# Patient Record
Sex: Female | Born: 1980
Health system: Southern US, Community
[De-identification: ages and names within clinical notes are randomized; demographics above are authoritative.]

## PROBLEM LIST (undated history)

## (undated) DIAGNOSIS — F419 Anxiety disorder, unspecified: Secondary | ICD-10-CM

## (undated) DIAGNOSIS — F329 Major depressive disorder, single episode, unspecified: Secondary | ICD-10-CM

## (undated) DIAGNOSIS — Z9889 Other specified postprocedural states: Secondary | ICD-10-CM

## (undated) DIAGNOSIS — B019 Varicella without complication: Secondary | ICD-10-CM

## (undated) DIAGNOSIS — E669 Obesity, unspecified: Principal | ICD-10-CM

## (undated) DIAGNOSIS — N200 Calculus of kidney: Secondary | ICD-10-CM

## (undated) DIAGNOSIS — J45909 Unspecified asthma, uncomplicated: Secondary | ICD-10-CM

## (undated) DIAGNOSIS — G43909 Migraine, unspecified, not intractable, without status migrainosus: Secondary | ICD-10-CM

## (undated) DIAGNOSIS — F32A Depression, unspecified: Secondary | ICD-10-CM

## (undated) DIAGNOSIS — E1169 Type 2 diabetes mellitus with other specified complication: Secondary | ICD-10-CM

## (undated) DIAGNOSIS — R112 Nausea with vomiting, unspecified: Secondary | ICD-10-CM

## (undated) DIAGNOSIS — K760 Fatty (change of) liver, not elsewhere classified: Secondary | ICD-10-CM

## (undated) HISTORY — DX: Major depressive disorder, single episode, unspecified: F32.9

## (undated) HISTORY — DX: Migraine, unspecified, not intractable, without status migrainosus: G43.909

## (undated) HISTORY — DX: Varicella without complication: B01.9

## (undated) HISTORY — DX: Unspecified asthma, uncomplicated: J45.909

## (undated) HISTORY — DX: Depression, unspecified: F32.A

## (undated) HISTORY — DX: Calculus of kidney: N20.0

## (undated) HISTORY — DX: Type 2 diabetes mellitus with other specified complication: E11.69

## (undated) HISTORY — DX: Obesity, unspecified: E66.9

## (undated) HISTORY — DX: Anxiety disorder, unspecified: F41.9

---

## 2012-11-27 ENCOUNTER — Emergency Department (HOSPITAL_COMMUNITY)
Admission: EM | Admit: 2012-11-27 | Discharge: 2012-11-27 | Disposition: A | Discharge status: Home / Self Care | Payer: Self-pay | Attending: Emergency Medicine | Admitting: Emergency Medicine

## 2012-11-27 ENCOUNTER — Encounter (HOSPITAL_COMMUNITY): Payer: Self-pay

## 2012-11-27 DIAGNOSIS — R6883 Chills (without fever): Secondary | ICD-10-CM | POA: Insufficient documentation

## 2012-11-27 DIAGNOSIS — N390 Urinary tract infection, site not specified: Secondary | ICD-10-CM | POA: Insufficient documentation

## 2012-11-27 DIAGNOSIS — R110 Nausea: Secondary | ICD-10-CM | POA: Insufficient documentation

## 2012-11-27 DIAGNOSIS — Z79899 Other long term (current) drug therapy: Secondary | ICD-10-CM | POA: Insufficient documentation

## 2012-11-27 DIAGNOSIS — R197 Diarrhea, unspecified: Secondary | ICD-10-CM | POA: Insufficient documentation

## 2012-11-27 DIAGNOSIS — R300 Dysuria: Secondary | ICD-10-CM | POA: Insufficient documentation

## 2012-11-27 DIAGNOSIS — Z3202 Encounter for pregnancy test, result negative: Secondary | ICD-10-CM | POA: Insufficient documentation

## 2012-11-27 DIAGNOSIS — Z8719 Personal history of other diseases of the digestive system: Secondary | ICD-10-CM | POA: Insufficient documentation

## 2012-11-27 HISTORY — DX: Fatty (change of) liver, not elsewhere classified: K76.0

## 2012-11-27 LAB — CBC WITH DIFFERENTIAL/PLATELET
Basophils Absolute: 0 10*3/uL (ref 0.0–0.1)
Eosinophils Relative: 0 % (ref 0–5)
HCT: 37.9 % (ref 36.0–46.0)
Lymphocytes Relative: 15 % (ref 12–46)
Lymphs Abs: 1.6 10*3/uL (ref 0.7–4.0)
MCH: 26.5 pg (ref 26.0–34.0)
MCV: 79.1 fL (ref 78.0–100.0)
Monocytes Absolute: 0.5 10*3/uL (ref 0.1–1.0)
RDW: 14.4 % (ref 11.5–15.5)
WBC: 10.6 10*3/uL — ABNORMAL HIGH (ref 4.0–10.5)

## 2012-11-27 LAB — URINE MICROSCOPIC-ADD ON

## 2012-11-27 LAB — URINALYSIS, ROUTINE W REFLEX MICROSCOPIC
Glucose, UA: NEGATIVE mg/dL
Ketones, ur: NEGATIVE mg/dL
pH: 8.5 — ABNORMAL HIGH (ref 5.0–8.0)

## 2012-11-27 LAB — PREGNANCY, URINE: Preg Test, Ur: NEGATIVE

## 2012-11-27 LAB — BASIC METABOLIC PANEL
CO2: 24 mEq/L (ref 19–32)
Calcium: 9.1 mg/dL (ref 8.4–10.5)
Creatinine, Ser: 0.58 mg/dL (ref 0.50–1.10)
Glucose, Bld: 174 mg/dL — ABNORMAL HIGH (ref 70–99)

## 2012-11-27 LAB — WET PREP, GENITAL
Trich, Wet Prep: NONE SEEN
Yeast Wet Prep HPF POC: NONE SEEN

## 2012-11-27 MED ORDER — ACETAMINOPHEN 325 MG PO TABS
650.0000 mg | ORAL_TABLET | Freq: Once | ORAL | Status: AC
  Administered 2012-11-27: 650 mg via ORAL

## 2012-11-27 MED ORDER — PHENAZOPYRIDINE HCL 200 MG PO TABS: 200.0000 mg | ORAL_TABLET | Freq: Three times a day (TID) | ORAL | Status: DC

## 2012-11-27 MED ORDER — CIPROFLOXACIN HCL 500 MG PO TABS: 500.0000 mg | ORAL_TABLET | Freq: Two times a day (BID) | ORAL | Status: DC

## 2012-11-27 MED ORDER — ONDANSETRON 4 MG PO TBDP
8.0000 mg | ORAL_TABLET | Freq: Once | ORAL | Status: AC
  Administered 2012-11-27: 8 mg via ORAL

## 2012-11-27 NOTE — ED Provider Notes (Signed)
History     CSN: [id number]  Arrival date & time 11/27/12  1159   First MD Initiated Contact with Patient 11/27/12 1322      Chief Complaint  Patient presents with  . Abdominal Pain    (Consider location/radiation/quality/duration/timing/severity/associated sxs/prior treatment) HPI Comments: Pt presents to the ED for intermittent, sharp, right sided flank pain radiating to her RLQ x 3 days.  Associated sx include dysuria, nausea, chills, and diarrhea.  Has had prior UTI's in the past but never associated with nausea.  Hx of fatty liver disease treated with lipitor.  Denies any hematochezia, vomiting, or vaginal discharge.  UTD with GYN exams.  No new sexual partners or concern for STD.  LMP last week.  The history is provided by the patient.    Past Medical History  Diagnosis Date  . Fatty liver   . UTI (urinary tract infection)     Past Surgical History  Procedure Laterality Date  . Cesarean section      No family history on file.  History  Substance Use Topics  . Smoking status: Not on file  . Smokeless tobacco: Not on file  . Alcohol Use: No    OB History   Grav Para Term Preterm Abortions TAB SAB Ect Mult Living                  Review of Systems  Gastrointestinal: Positive for nausea and abdominal pain.  Genitourinary: Positive for dysuria and flank pain.  All other systems reviewed and are negative.    Allergies  Review of patient's allergies indicates no known allergies.  Home Medications   Current Outpatient Rx  Name  Route  Sig  Dispense  Refill  . albuterol (PROVENTIL HFA;VENTOLIN HFA) 108 (90 BASE) MCG/ACT inhaler   Inhalation   Inhale 2 puffs into the lungs every 6 (six) hours as needed for wheezing.         Marland Kitchen atorvastatin (LIPITOR) 10 MG tablet   Oral   Take 10 mg by mouth daily.         . cetirizine (ZYRTEC) 10 MG tablet   Oral   Take 10 mg by mouth daily.         . ferrous sulfate 325 (65 FE) MG tablet   Oral   Take 325 mg  by mouth daily with breakfast.         . metFORMIN (GLUCOPHAGE) 500 MG tablet   Oral   Take 500 mg by mouth daily with breakfast.           BP 145/111  Pulse 100  Temp(Src) 97.9 F (36.6 C) (Oral)  Resp 20  SpO2 100%  LMP 11/20/2012  Physical Exam  Nursing note and vitals reviewed. Constitutional: She is oriented to person, place, and time. She appears well-developed and well-nourished.  HENT:  Head: Normocephalic and atraumatic.  Mouth/Throat: Oropharynx is clear and moist.  Eyes: Conjunctivae and EOM are normal. Pupils are equal, round, and reactive to light.  Neck: Normal range of motion.  Cardiovascular: Normal rate, regular rhythm and normal heart sounds.   Pulmonary/Chest: Effort normal and breath sounds normal.  Abdominal: Soft. Bowel sounds are normal. There is tenderness in the suprapubic area. There is CVA tenderness (R > L). There is no tenderness at McBurney's point and negative Murphy's sign.  Genitourinary: Vagina normal. There is no lesion on the right labia. There is no lesion on the left labia. Cervix exhibits no motion tenderness and no discharge.  Right adnexum displays no mass and no tenderness. Left adnexum displays no mass and no tenderness. No tenderness around the vagina.  Neurological: She is alert and oriented to person, place, and time.  Skin: Skin is warm and dry.  Psychiatric: She has a normal mood and affect.    ED Course  Procedures (including critical care time)  Labs Reviewed  WET PREP, GENITAL - Abnormal; Notable for the following:    WBC, Wet Prep HPF POC FEW (*)    All other components within normal limits  CBC WITH DIFFERENTIAL - Abnormal; Notable for the following:    WBC 10.6 (*)    Neutrophils Relative 80 (*)    Neutro Abs 8.4 (*)    All other components within normal limits  BASIC METABOLIC PANEL - Abnormal; Notable for the following:    Glucose, Bld 174 (*)    All other components within normal limits  URINALYSIS, ROUTINE W  REFLEX MICROSCOPIC - Abnormal; Notable for the following:    Color, Urine RED (*)    APPearance CLOUDY (*)    pH 8.5 (*)    Hgb urine dipstick LARGE (*)    Bilirubin Urine SMALL (*)    Protein, ur 30 (*)    Nitrite POSITIVE (*)    Leukocytes, UA TRACE (*)    All other components within normal limits  URINE CULTURE  GC/CHLAMYDIA PROBE AMP  LIPASE, BLOOD  URINE MICROSCOPIC-ADD ON  PREGNANCY, URINE   No results found.   1. Urinary tract infection   2. Dysuria       MDM   Pt presenting to the ED with R flank pain radiating to RLQ x 3 days associated with dysuria, nausea and chills.  u-preg negative.  U/A + for UTI.  Culture pending.  Wet prep negative.  Rx cipro 7d and pyridium.  Discussed plan with pt, she agreed.  Return precautions advised.        Garlon Hatchet, PA-C 11/27/12 1607

## 2012-11-27 NOTE — ED Notes (Signed)
Pt presents with right abdominal pain radiating to right flank. Pt states "i feel so nauseous." Pt had diarrhea this morning but no vomiting.

## 2012-11-27 NOTE — ED Provider Notes (Signed)
Medical screening examination/treatment/procedure(s) were performed by non-physician practitioner and as supervising physician I was immediately available for consultation/collaboration.  Gilda Crease, MD 11/27/12 [phone]

## 2012-11-27 NOTE — ED Notes (Signed)
Pt presents with 2 week h/o intermittent R sided abdominal pain.  Pt reports pain radiates around to R flank and into lower R side.  Pt reports needing to void, but not voiding a lot, reports nausea and 1 episode of diarrhea today.  Pt reports menses last week that ended x 4 days ago but reports vaginal bleeding that began today.

## 2012-11-28 LAB — URINE CULTURE

## 2012-11-30 ENCOUNTER — Encounter (HOSPITAL_COMMUNITY): Payer: Self-pay | Admitting: Emergency Medicine

## 2012-11-30 ENCOUNTER — Emergency Department (HOSPITAL_COMMUNITY)
Admission: EM | Admit: 2012-11-30 | Discharge: 2012-12-01 | Disposition: A | Discharge status: Home / Self Care | Payer: Self-pay | Attending: Emergency Medicine | Admitting: Emergency Medicine

## 2012-11-30 DIAGNOSIS — Z792 Long term (current) use of antibiotics: Secondary | ICD-10-CM | POA: Insufficient documentation

## 2012-11-30 DIAGNOSIS — Z3202 Encounter for pregnancy test, result negative: Secondary | ICD-10-CM | POA: Insufficient documentation

## 2012-11-30 DIAGNOSIS — R109 Unspecified abdominal pain: Secondary | ICD-10-CM | POA: Insufficient documentation

## 2012-11-30 DIAGNOSIS — E669 Obesity, unspecified: Secondary | ICD-10-CM | POA: Insufficient documentation

## 2012-11-30 DIAGNOSIS — Z8719 Personal history of other diseases of the digestive system: Secondary | ICD-10-CM | POA: Insufficient documentation

## 2012-11-30 DIAGNOSIS — R6883 Chills (without fever): Secondary | ICD-10-CM | POA: Insufficient documentation

## 2012-11-30 DIAGNOSIS — Z79899 Other long term (current) drug therapy: Secondary | ICD-10-CM | POA: Insufficient documentation

## 2012-11-30 DIAGNOSIS — N23 Unspecified renal colic: Secondary | ICD-10-CM | POA: Insufficient documentation

## 2012-11-30 DIAGNOSIS — R110 Nausea: Secondary | ICD-10-CM | POA: Insufficient documentation

## 2012-11-30 DIAGNOSIS — N201 Calculus of ureter: Secondary | ICD-10-CM | POA: Insufficient documentation

## 2012-11-30 LAB — POCT PREGNANCY, URINE: Preg Test, Ur: NEGATIVE

## 2012-11-30 NOTE — ED Notes (Signed)
PT. DIAGNOSED WITH UTI 4 DAYS AGO PRESCRIBED WITH CIPRO ANTIBIOTIC / PAIN MEDICATION WITH NO RELIEF REPORTS PERSISTENT RIGHT FLANK PAIN WITH NAUSEA.

## 2012-12-01 ENCOUNTER — Encounter (HOSPITAL_COMMUNITY): Payer: Self-pay | Admitting: Radiology

## 2012-12-01 ENCOUNTER — Emergency Department (HOSPITAL_COMMUNITY): Payer: Self-pay

## 2012-12-01 LAB — URINE MICROSCOPIC-ADD ON

## 2012-12-01 LAB — URINALYSIS, ROUTINE W REFLEX MICROSCOPIC
Glucose, UA: >1000 mg/dL — AB
Ketones, ur: 15 mg/dL — AB
Protein, ur: 30 mg/dL — AB
Specific Gravity, Urine: 1.027 (ref 1.005–1.030)
Urobilinogen, UA: 2 mg/dL — ABNORMAL HIGH (ref 0.0–1.0)
Urobilinogen, UA: 4 mg/dL — ABNORMAL HIGH (ref 0.0–1.0)
pH: 5 (ref 5.0–8.0)
pH: 5.5 (ref 5.0–8.0)

## 2012-12-01 LAB — CBC WITH DIFFERENTIAL/PLATELET
Basophils Absolute: 0 10*3/uL (ref 0.0–0.1)
Eosinophils Absolute: 0.1 10*3/uL (ref 0.0–0.7)
HCT: 38 % (ref 36.0–46.0)
Lymphs Abs: 2.4 10*3/uL (ref 0.7–4.0)
MCHC: 34.5 g/dL (ref 30.0–36.0)
MCV: 78 fL (ref 78.0–100.0)
Neutro Abs: 9.3 10*3/uL — ABNORMAL HIGH (ref 1.7–7.7)
Platelets: 318 10*3/uL (ref 150–400)
RDW: 14.2 % (ref 11.5–15.5)

## 2012-12-01 LAB — BASIC METABOLIC PANEL
Calcium: 9.5 mg/dL (ref 8.4–10.5)
Creatinine, Ser: 0.72 mg/dL (ref 0.50–1.10)
GFR calc Af Amer: >90 mL/min (ref >90)
GFR calc non Af Amer: >90 mL/min (ref >90)
Sodium: 137 mEq/L (ref 135–145)

## 2012-12-01 MED ORDER — TAMSULOSIN HCL 0.4 MG PO CAPS
0.4000 mg | ORAL_CAPSULE | Freq: Once | ORAL | Status: AC
  Administered 2012-12-01: 0.4 mg via ORAL
  Filled 2012-12-01: qty 1

## 2012-12-01 MED ORDER — OXYCODONE-ACETAMINOPHEN 5-325 MG PO TABS: 1.0000 | ORAL_TABLET | ORAL | Status: DC | PRN

## 2012-12-01 MED ORDER — HYDROMORPHONE HCL PF 1 MG/ML IJ SOLN
1.0000 mg | Freq: Once | INTRAMUSCULAR | Status: AC
  Administered 2012-12-01: 1 mg via INTRAVENOUS
  Filled 2012-12-01: qty 1

## 2012-12-01 MED ORDER — ONDANSETRON HCL 8 MG PO TABS: 8.0000 mg | ORAL_TABLET | Freq: Three times a day (TID) | ORAL | Status: DC | PRN

## 2012-12-01 MED ORDER — AZITHROMYCIN 500 MG IV SOLR: 500.0000 mg | Freq: Once | INTRAVENOUS | Status: DC

## 2012-12-01 MED ORDER — ONDANSETRON HCL 4 MG/2ML IJ SOLN
4.0000 mg | Freq: Once | INTRAMUSCULAR | Status: AC
  Administered 2012-12-01: 4 mg via INTRAVENOUS
  Filled 2012-12-01: qty 2

## 2012-12-01 MED ORDER — DEXTROSE 5 % IV SOLN: 1.0000 g | Freq: Once | INTRAVENOUS | Status: DC

## 2012-12-01 MED ORDER — TAMSULOSIN HCL 0.4 MG PO CAPS: ORAL_CAPSULE | ORAL | Status: DC

## 2012-12-01 NOTE — ED Notes (Signed)
In and out cath completed using sterile technique. Orange, clear urine returned.  Pt sts she is on antibiotics for UTI that makes urine orange

## 2012-12-01 NOTE — ED Provider Notes (Signed)
History     CSN: [id number]  Arrival date & time 11/30/12  2312   First MD Initiated Contact with Patient 11/30/12 2358      Chief Complaint  Patient presents with  . Urinary Tract Infection    (Consider location/radiation/quality/duration/timing/severity/associated sxs/prior treatment) HPI Comments: Martha Shannon is a 32 y.o. female who has had persistent right flank, radiating to right abdomen pain for 4 days. She was seen and treated here with Cipro for possible UTI, on 11/27/12. She is taking Percocet for pain, without relief. Her urine culture did not show a urinary tract infection. She has had periods of nausea and chills. There has been no documented fever. She denies chest pain, cough, weakness, or dizziness. Her father has kidney stones. She is diagnosed with kidney stones. There are no known modifying factors.  Patient is a 32 y.o. female presenting with urinary tract infection. The history is provided by the patient.  Urinary Tract Infection    Past Medical History  Diagnosis Date  . Fatty liver   . UTI (urinary tract infection)   . Obese     Past Surgical History  Procedure Laterality Date  . Cesarean section      No family history on file.  History  Substance Use Topics  . Smoking status: Not on file  . Smokeless tobacco: Not on file  . Alcohol Use: No    OB History   Grav Para Term Preterm Abortions TAB SAB Ect Mult Living                  Review of Systems  All other systems reviewed and are negative.    Allergies  Review of patient's allergies indicates no known allergies.  Home Medications   Current Outpatient Rx  Name  Route  Sig  Dispense  Refill  . acetaminophen (TYLENOL) 500 MG tablet   Oral   Take 500 mg by mouth every 6 (six) hours as needed for pain.         Marland Kitchen albuterol (PROVENTIL HFA;VENTOLIN HFA) 108 (90 BASE) MCG/ACT inhaler   Inhalation   Inhale 2 puffs into the lungs every 6 (six) hours as needed for wheezing.          Marland Kitchen atorvastatin (LIPITOR) 10 MG tablet   Oral   Take 10 mg by mouth daily.         . ciprofloxacin (CIPRO) 500 MG tablet   Oral   Take 500 mg by mouth 2 (two) times daily.         . ferrous sulfate 325 (65 FE) MG tablet   Oral   Take 325 mg by mouth daily with breakfast.         . metFORMIN (GLUCOPHAGE) 500 MG tablet   Oral   Take 500 mg by mouth daily with breakfast.         . oxyCODONE-acetaminophen (PERCOCET/ROXICET) 5-325 MG per tablet   Oral   Take 2 tablets by mouth every 4 (four) hours as needed for pain.         . phenazopyridine (PYRIDIUM) 200 MG tablet   Oral   Take 200 mg by mouth 3 (three) times daily.         . ondansetron (ZOFRAN) 8 MG tablet   Oral   Take 1 tablet (8 mg total) by mouth every 8 (eight) hours as needed for nausea.   20 tablet   0   . oxyCODONE-acetaminophen (PERCOCET/ROXICET) 5-325 MG per tablet  Oral   Take 1 tablet by mouth every 4 (four) hours as needed for pain.   30 tablet   0   . tamsulosin (FLOMAX) 0.4 MG CAPS      1 q HS to aid stone passage   7 capsule   0     BP 127/83  Pulse 88  Temp(Src) 97.8 F (36.6 C) (Oral)  Resp 18  SpO2 98%  LMP 11/20/2012  Physical Exam  Nursing note and vitals reviewed. Constitutional: She is oriented to person, place, and time. She appears well-developed and well-nourished. She appears distressed (appears uncomfortable, pacing the floor).  HENT:  Head: Normocephalic and atraumatic.  Eyes: Conjunctivae and EOM are normal. Pupils are equal, round, and reactive to light.  Neck: Normal range of motion and phonation normal. Neck supple.  Cardiovascular: Normal rate, regular rhythm and intact distal pulses.   Pulmonary/Chest: Effort normal and breath sounds normal. She exhibits no tenderness.  Abdominal: Soft. She exhibits no distension. There is no tenderness. There is no guarding.  Genitourinary:  Right costovertebral angle tenderness with percussion.  Musculoskeletal:  Normal range of motion.  Neurological: She is alert and oriented to person, place, and time. She has normal strength. She exhibits normal muscle tone.  Skin: Skin is warm and dry.  Psychiatric: She has a normal mood and affect. Her behavior is normal. Judgment and thought content normal.    ED Course  Procedures (including critical care time)  Medications  HYDROmorphone (DILAUDID) injection 1 mg (1 mg Intravenous Given 12/01/12 0027)  ondansetron (ZOFRAN) injection 4 mg (4 mg Intravenous Given 12/01/12 0027)  HYDROmorphone (DILAUDID) injection 1 mg (1 mg Intravenous Given 12/01/12 0121)  HYDROmorphone (DILAUDID) injection 1 mg (1 mg Intravenous Given 12/01/12 0339)  ondansetron (ZOFRAN) injection 4 mg (4 mg Intravenous Given 12/01/12 0339)  tamsulosin (FLOMAX) capsule 0.4 mg (0.4 mg Oral Given 12/01/12 0338)     Patient Vitals for the past 24 hrs:  BP Temp Temp src Pulse Resp SpO2  12/01/12 0251 127/83 mmHg 97.8 F (36.6 C) Oral 88 18 98 %  11/30/12 2321 151/78 mmHg 97.8 F (36.6 C) Oral 78 14 99 %    Discharge Reevaluation with update and discussion. After initial assessment and treatment, an updated evaluation reveals she feels better. Martha Shannon L    Labs Reviewed  URINALYSIS, ROUTINE W REFLEX MICROSCOPIC - Abnormal; Notable for the following:    Color, Urine RED (*)    APPearance CLOUDY (*)    Bilirubin Urine MODERATE (*)    Ketones, ur 15 (*)    Protein, ur 30 (*)    Urobilinogen, UA 4.0 (*)    Nitrite POSITIVE (*)    Leukocytes, UA MODERATE (*)    All other components within normal limits  URINE MICROSCOPIC-ADD ON - Abnormal; Notable for the following:    Squamous Epithelial / LPF MANY (*)    Bacteria, UA FEW (*)    All other components within normal limits  CBC WITH DIFFERENTIAL - Abnormal; Notable for the following:    WBC 12.6 (*)    Neutro Abs 9.3 (*)    All other components within normal limits  BASIC METABOLIC PANEL - Abnormal; Notable for the following:     Glucose, Bld 151 (*)    All other components within normal limits  URINALYSIS, ROUTINE W REFLEX MICROSCOPIC - Abnormal; Notable for the following:    Color, Urine RED (*)    APPearance CLOUDY (*)    Glucose, UA >1000 (*)  Hgb urine dipstick SMALL (*)    Bilirubin Urine SMALL (*)    Ketones, ur 15 (*)    Protein, ur 30 (*)    Urobilinogen, UA 2.0 (*)    Nitrite POSITIVE (*)    Leukocytes, UA SMALL (*)    All other components within normal limits  URINE MICROSCOPIC-ADD ON - Abnormal; Notable for the following:    Squamous Epithelial / LPF FEW (*)    All other components within normal limits  URINE CULTURE  POCT PREGNANCY, URINE   Ct Abdomen Pelvis Wo Contrast  12/01/2012  *RADIOLOGY REPORT*  Clinical Data: Right flank pain and nausea.  CT ABDOMEN AND PELVIS WITHOUT CONTRAST  Technique:  Multidetector CT imaging of the abdomen and pelvis was performed following the standard protocol without intravenous contrast.  Comparison: None.  Findings: Minimal right basilar atelectasis is noted.  There is diffuse fatty infiltration within the liver, with mild sparing about the gallbladder fossa.  The gallbladder is within normal limits.  The pancreas and adrenal glands are unremarkable.  There is mild fullness of the right renal collecting system, without significant hydronephrosis.  No renal or ureteral stones are seen.  No perinephric stranding is appreciated.  No free fluid is identified.  The small bowel is unremarkable in appearance.  The stomach is within normal limits.  No acute vascular abnormalities are seen.  The appendix is normal in caliber, without evidence for appendicitis.  The colon is unremarkable in appearance.  The bladder is mildly distended.  There appears to be a tiny 3-4 mm stone at the base of the bladder on the right side, about the expected location of the right vesicoureteral junction.  This may reflect a still obstructing or recently passed stone.  No inguinal lymphadenopathy is  seen.  No acute osseous abnormalities are identified.  IMPRESSION:  1.  Tiny 3-4 mm stone noted at the right base of the bladder, about the expected location of the right vesicoureteral junction.  This may reflect a still obstructing or recently passed stone.  Mild associated fullness of the right renal collecting system, without significant hydronephrosis. 2.  Diffuse fatty infiltration within the liver.   Original Report Authenticated By: Tonia Ghent, M.D.    Nursing Notes Reviewed/ Care Coordinated, and agree without changes. Applicable Imaging Reviewed.  Interpretation of Laboratory Data incorporated into ED treatment  1. Right ureteral stone   2. Ureteral colic       MDM  Flank pain, secondary to ureteral colic. A stone is small and distal and will likely pass, with symptomatic treatment. Doubt metabolic instability, serious bacterial infection or impending vascular collapse; the patient is stable for discharge.    Plan: Home Medications- Flomax, Percocet, Zofran; Home Treatments- strain urine; Recommended follow up- urology follow up 1 week       Flint Melter, MD 12/01/12 [phone]

## 2012-12-02 LAB — URINE CULTURE: Culture: NO GROWTH

## 2014-05-25 ENCOUNTER — Telehealth: Payer: Self-pay | Admitting: Gynecology

## 2014-05-25 NOTE — Telephone Encounter (Signed)
Left message, upcoming appointment canceled and needs to be rescheduled.

## 2014-06-15 ENCOUNTER — Encounter: Payer: Self-pay | Admitting: Gynecology

## 2014-06-16 ENCOUNTER — Encounter: Payer: Self-pay | Admitting: Gynecology

## 2014-08-13 ENCOUNTER — Ambulatory Visit: Payer: Self-pay | Admitting: Nurse Practitioner

## 2014-10-15 ENCOUNTER — Other Ambulatory Visit: Payer: Self-pay | Admitting: Obstetrics and Gynecology

## 2014-10-15 DIAGNOSIS — N6310 Unspecified lump in the right breast, unspecified quadrant: Principal | ICD-10-CM

## 2014-10-15 DIAGNOSIS — N6315 Unspecified lump in the right breast, overlapping quadrants: Secondary | ICD-10-CM

## 2014-10-25 ENCOUNTER — Ambulatory Visit
Admission: RE | Admit: 2014-10-25 | Discharge: 2014-10-25 | Disposition: A | Discharge status: Home / Self Care | Payer: BLUE CROSS/BLUE SHIELD | Source: Ambulatory Visit | Attending: Obstetrics and Gynecology | Admitting: Obstetrics and Gynecology

## 2014-10-25 DIAGNOSIS — N6315 Unspecified lump in the right breast, overlapping quadrants: Secondary | ICD-10-CM

## 2014-10-25 DIAGNOSIS — N6310 Unspecified lump in the right breast, unspecified quadrant: Principal | ICD-10-CM

## 2015-05-12 DIAGNOSIS — E1169 Type 2 diabetes mellitus with other specified complication: Secondary | ICD-10-CM | POA: Insufficient documentation

## 2015-05-12 DIAGNOSIS — D509 Iron deficiency anemia, unspecified: Secondary | ICD-10-CM | POA: Insufficient documentation

## 2015-05-12 DIAGNOSIS — F419 Anxiety disorder, unspecified: Secondary | ICD-10-CM | POA: Insufficient documentation

## 2015-05-12 DIAGNOSIS — F32A Depression, unspecified: Secondary | ICD-10-CM | POA: Insufficient documentation

## 2015-05-12 DIAGNOSIS — J4530 Mild persistent asthma, uncomplicated: Secondary | ICD-10-CM | POA: Insufficient documentation

## 2015-05-12 DIAGNOSIS — F329 Major depressive disorder, single episode, unspecified: Secondary | ICD-10-CM | POA: Insufficient documentation

## 2015-05-26 DIAGNOSIS — E559 Vitamin D deficiency, unspecified: Secondary | ICD-10-CM | POA: Insufficient documentation

## 2015-11-08 DIAGNOSIS — S335XXA Sprain of ligaments of lumbar spine, initial encounter: Secondary | ICD-10-CM | POA: Insufficient documentation

## 2016-09-18 ENCOUNTER — Encounter: Payer: Self-pay | Admitting: Women's Health

## 2016-09-18 ENCOUNTER — Ambulatory Visit (INDEPENDENT_AMBULATORY_CARE_PROVIDER_SITE_OTHER): Payer: 59 | Admitting: Women's Health

## 2016-09-18 VITALS — BP 130/88 | Ht 61.0 in | Wt 250.0 lb

## 2016-09-18 DIAGNOSIS — Z13220 Encounter for screening for lipoid disorders: Secondary | ICD-10-CM | POA: Diagnosis not present

## 2016-09-18 DIAGNOSIS — Z01419 Encounter for gynecological examination (general) (routine) without abnormal findings: Secondary | ICD-10-CM

## 2016-09-18 DIAGNOSIS — Z833 Family history of diabetes mellitus: Secondary | ICD-10-CM | POA: Diagnosis not present

## 2016-09-18 DIAGNOSIS — N912 Amenorrhea, unspecified: Secondary | ICD-10-CM

## 2016-09-18 LAB — TSH: TSH: 0.86 mIU/L

## 2016-09-18 LAB — COMPREHENSIVE METABOLIC PANEL
ALBUMIN: 3.7 g/dL (ref 3.6–5.1)
ALK PHOS: 62 U/L (ref 33–115)
ALT: 170 U/L — AB (ref 6–29)
AST: 113 U/L — AB (ref 10–30)
BILIRUBIN TOTAL: 0.4 mg/dL (ref 0.2–1.2)
BUN: 11 mg/dL (ref 7–25)
CALCIUM: 9.2 mg/dL (ref 8.6–10.2)
CO2: 24 mmol/L (ref 20–31)
CREATININE: 0.52 mg/dL (ref 0.50–1.10)
Chloride: 101 mmol/L (ref 98–110)
Glucose, Bld: 172 mg/dL — ABNORMAL HIGH (ref 65–99)
Potassium: 3.9 mmol/L (ref 3.5–5.3)
SODIUM: 135 mmol/L (ref 135–146)
TOTAL PROTEIN: 6.7 g/dL (ref 6.1–8.1)

## 2016-09-18 LAB — LIPID PANEL
CHOLESTEROL: 176 mg/dL (ref <200)
HDL: 33 mg/dL — ABNORMAL LOW (ref >50)
LDL Cholesterol: 127 mg/dL — ABNORMAL HIGH (ref <100)
Total CHOL/HDL Ratio: 5.3 Ratio — ABNORMAL HIGH (ref <5.0)
Triglycerides: 81 mg/dL (ref <150)
VLDL: 16 mg/dL (ref <30)

## 2016-09-18 LAB — CBC WITH DIFFERENTIAL/PLATELET
BASOS ABS: 0 {cells}/uL (ref 0–200)
Basophils Relative: 0 %
Eosinophils Absolute: 88 cells/uL (ref 15–500)
Eosinophils Relative: 1 %
HEMATOCRIT: 40.8 % (ref 35.0–45.0)
HEMOGLOBIN: 12.7 g/dL (ref 11.7–15.5)
LYMPHS ABS: 3168 {cells}/uL (ref 850–3900)
Lymphocytes Relative: 36 %
MCH: 23.6 pg — ABNORMAL LOW (ref 27.0–33.0)
MCHC: 31.1 g/dL — ABNORMAL LOW (ref 32.0–36.0)
MCV: 76 fL — ABNORMAL LOW (ref 80.0–100.0)
MONO ABS: 440 {cells}/uL (ref 200–950)
MPV: 9.3 fL (ref 7.5–12.5)
Monocytes Relative: 5 %
NEUTROS ABS: 5104 {cells}/uL (ref 1500–7800)
NEUTROS PCT: 58 %
Platelets: 346 10*3/uL (ref 140–400)
RBC: 5.37 MIL/uL — AB (ref 3.80–5.10)
RDW: 17.4 % — ABNORMAL HIGH (ref 11.0–15.0)
WBC: 8.8 10*3/uL (ref 3.8–10.8)

## 2016-09-18 NOTE — Patient Instructions (Addendum)
Health Maintenance, Female Introduction Carbohydrate Counting for Diabetes Mellitus, Adult Carbohydrate counting is a method for keeping track of how many carbohydrates you eat. Eating carbohydrates naturally increases the amount of sugar (glucose) in the blood. Counting how many carbohydrates you eat helps keep your blood glucose within normal limits, which helps you manage your diabetes (diabetes mellitus). It is important to know how many carbohydrates you can safely have in each meal. This is different for every person. A diet and nutrition specialist (registered dietitian) can help you make a meal plan and calculate how many carbohydrates you should have at each meal and snack. Carbohydrates are found in the following foods:  Grains, such as breads and cereals.  Dried beans and soy products.  Starchy vegetables, such as potatoes, peas, and corn.  Fruit and fruit juices.  Milk and yogurt.  Sweets and snack foods, such as cake, cookies, candy, chips, and soft drinks. How do I count carbohydrates? There are two ways to count carbohydrates in food. You can use either of the methods or a combination of both. Reading "Nutrition Facts" on packaged food  The "Nutrition Facts" list is included on the labels of almost all packaged foods and beverages in the [place] It includes:  The serving size.  Information about nutrients in each serving, including the grams (g) of carbohydrate per serving. To use the "Nutrition Facts":  Decide how many servings you will have.  Multiply the number of servings by the number of carbohydrates per serving.  The resulting number is the total amount of carbohydrates that you will be having. Learning standard serving sizes of other foods  When you eat foods containing carbohydrates that are not packaged or do not include "Nutrition Facts" on the label, you need to measure the servings in order to count the amount of carbohydrates:  Measure the foods that you  will eat with a food scale or measuring cup, if needed.  Decide how many standard-size servings you will eat.  Multiply the number of servings by 15. Most carbohydrate-rich foods have about 15 g of carbohydrates per serving.  For example, if you eat 8 oz (170 g) of strawberries, you will have eaten 2 servings and 30 g of carbohydrates (2 servings x 15 g = 30 g).  For foods that have more than one food mixed, such as soups and casseroles, you must count the carbohydrates in each food that is included. The following list contains standard serving sizes of common carbohydrate-rich foods. Each of these servings has about 15 g of carbohydrates:   hamburger bun or  English muffin.   oz (15 mL) syrup.   oz (14 g) jelly.  1 slice of bread.  1 six-inch tortilla.  3 oz (85 g) cooked rice or pasta.  4 oz (113 g) cooked dried beans.  4 oz (113 g) starchy vegetable, such as peas, corn, or potatoes.  4 oz (113 g) hot cereal.  4 oz (113 g) mashed potatoes or  of a large baked potato.  4 oz (113 g) canned or frozen fruit.  4 oz (120 mL) fruit juice.  4-6 crackers.  6 chicken nuggets.  6 oz (170 g) unsweetened dry cereal.  6 oz (170 g) plain fat-free yogurt or yogurt sweetened with artificial sweeteners.  8 oz (240 mL) milk.  8 oz (170 g) fresh fruit or one small piece of fruit.  24 oz (680 g) popped popcorn. Example of carbohydrate counting Sample meal  3 oz (85 g) chicken  breast.  6 oz (170 g) brown rice.  4 oz (113 g) corn.  8 oz (240 mL) milk.  8 oz (170 g) strawberries with sugar-free whipped topping. Carbohydrate calculation 1. Identify the foods that contain carbohydrates:  Rice.  Corn.  Milk.  Strawberries. 2. Calculate how many servings you have of each food:  2 servings rice.  1 serving corn.  1 serving milk.  1 serving strawberries. 3. Multiply each number of servings by 15 g:  2 servings rice x 15 g = 30 g.  1 serving corn x 15 g = 15  g.  1 serving milk x 15 g = 15 g.  1 serving strawberries x 15 g = 15 g. 4. Add together all of the amounts to find the total grams of carbohydrates eaten:  30 g + 15 g + 15 g + 15 g = 75 g of carbohydrates total. This information is not intended to replace advice given to you by your health care provider. Make sure you discuss any questions you have with your health care provider. Document Released: 08/13/2005 Document Revised: 03/02/2016 Document Reviewed: 01/25/2016 Elsevier Interactive Patient Education  2017 [employer] ng preventive care can go a long way to promote health and wellness. Talk with your health care provider about what schedule of regular examinations is right for you. This is a good chance for you to check in with your provider about disease prevention and staying healthy. In between checkups, there are plenty of things you can do on your own. Experts have done a lot of research about which lifestyle changes and preventive measures are most likely to keep you healthy. Ask your health care provider for more information. Weight and diet Eat a healthy diet  Be sure to include plenty of vegetables, fruits, low-fat dairy products, and lean protein.  Do not eat a lot of foods high in solid fats, added sugars, or salt.  Get regular exercise. This is one of the most important things you can do for your health.  Most adults should exercise for at least 150 minutes each week. The exercise should increase your heart rate and make you sweat (moderate-intensity exercise).  Most adults should also do strengthening exercises at least twice a week. This is in addition to the moderate-intensity exercise. Maintain a healthy weight  Body mass index (BMI) is a measurement that can be used to identify possible weight problems. It estimates body fat based on height and weight. Your health care provider can help determine your BMI and help you achieve or maintain a healthy weight.  For  females 66 years of age and older:  A BMI below 18.5 is considered underweight.  A BMI of 18.5 to 24.9 is normal.  A BMI of 25 to 29.9 is considered overweight.  A BMI of 30 and above is considered obese. Watch levels of cholesterol and blood lipids  You should start having your blood tested for lipids and cholesterol at 37 years of age, then have this test every 5 years.  You may need to have your cholesterol levels checked more often if:  Your lipid or cholesterol levels are high.  You are older than 37 years of age.  You are at high risk for heart disease. Cancer screening Lung Cancer  Lung cancer screening is recommended for adults 90-44 years old who are at high risk for lung cancer because of a history of smoking.  A yearly low-dose CT scan of the lungs is recommended  for people who:  Currently smoke.  Have quit within the past 15 years.  Have at least a 30-pack-year history of smoking. A pack year is smoking an average of one pack of cigarettes a day for 1 year.  Yearly screening should continue until it has been 15 years since you quit.  Yearly screening should stop if you develop a health problem that would prevent you from having lung cancer treatment. Breast Cancer  Practice breast self-awareness. This means understanding how your breasts normally appear and feel.  It also means doing regular breast self-exams. Let your health care provider know about any changes, no matter how small.  If you are in your 20s or 30s, you should have a clinical breast exam (CBE) by a health care provider every 1-3 years as part of a regular health exam.  If you are 74 or older, have a CBE every year. Also consider having a breast X-ray (mammogram) every year.  If you have a family history of breast cancer, talk to your health care provider about genetic screening.  If you are at high risk for breast cancer, talk to your health care provider about having an MRI and a mammogram  every year.  Breast cancer gene (BRCA) assessment is recommended for women who have family members with BRCA-related cancers. BRCA-related cancers include:  Breast.  Ovarian.  Tubal.  Peritoneal cancers.  Results of the assessment will determine the need for genetic counseling and BRCA1 and BRCA2 testing. Cervical Cancer  Your health care provider may recommend that you be screened regularly for cancer of the pelvic organs (ovaries, uterus, and vagina). This screening involves a pelvic examination, including checking for microscopic changes to the surface of your cervix (Pap test). You may be encouraged to have this screening done every 3 years, beginning at age 18.  For women ages 39-65, health care providers may recommend pelvic exams and Pap testing every 3 years, or they may recommend the Pap and pelvic exam, combined with testing for human papilloma virus (HPV), every 5 years. Some types of HPV increase your risk of cervical cancer. Testing for HPV may also be done on women of any age with unclear Pap test results.  Other health care providers may not recommend any screening for nonpregnant women who are considered low risk for pelvic cancer and who do not have symptoms. Ask your health care provider if a screening pelvic exam is right for you.  If you have had past treatment for cervical cancer or a condition that could lead to cancer, you need Pap tests and screening for cancer for at least 20 years after your treatment. If Pap tests have been discontinued, your risk factors (such as having a new sexual partner) need to be reassessed to determine if screening should resume. Some women have medical problems that increase the chance of getting cervical cancer. In these cases, your health care provider may recommend more frequent screening and Pap tests. Colorectal Cancer  This type of cancer can be detected and often prevented.  Routine colorectal cancer screening usually begins at 37  years of age and continues through 37 years of age.  Your health care provider may recommend screening at an earlier age if you have risk factors for colon cancer.  Your health care provider may also recommend using home test kits to check for hidden blood in the stool.  A small camera at the end of a tube can be used to examine your colon directly (sigmoidoscopy  or colonoscopy). This is done to check for the earliest forms of colorectal cancer.  Routine screening usually begins at age 17.  Direct examination of the colon should be repeated every 5-10 years through 37 years of age. However, you may need to be screened more often if early forms of precancerous polyps or small growths are found. Skin Cancer  Check your skin from head to toe regularly.  Tell your health care provider about any new moles or changes in moles, especially if there is a change in a mole's shape or color.  Also tell your health care provider if you have a mole that is larger than the size of a pencil eraser.  Always use sunscreen. Apply sunscreen liberally and repeatedly throughout the day.  Protect yourself by wearing long sleeves, pants, a wide-brimmed hat, and sunglasses whenever you are outside. Heart disease, diabetes, and high blood pressure  High blood pressure causes heart disease and increases the risk of stroke. High blood pressure is more likely to develop in:  People who have blood pressure in the high end of the normal range (130-139/85-89 mm Hg).  People who are overweight or obese.  People who are African American.  If you are 17-72 years of age, have your blood pressure checked every 3-5 years. If you are 58 years of age or older, have your blood pressure checked every year. You should have your blood pressure measured twice-once when you are at a hospital or clinic, and once when you are not at a hospital or clinic. Record the average of the two measurements. To check your blood pressure when  you are not at a hospital or clinic, you can use:  An automated blood pressure machine at a pharmacy.  A home blood pressure monitor.  If you are between 88 years and 107 years old, ask your health care provider if you should take aspirin to prevent strokes.  Have regular diabetes screenings. This involves taking a blood sample to check your fasting blood sugar level.  If you are at a normal weight and have a low risk for diabetes, have this test once every three years after 37 years of age.  If you are overweight and have a high risk for diabetes, consider being tested at a younger age or more often. Preventing infection Hepatitis B  If you have a higher risk for hepatitis B, you should be screened for this virus. You are considered at high risk for hepatitis B if:  You were born in a country where hepatitis B is common. Ask your health care provider which countries are considered high risk.  Your parents were born in a high-risk country, and you have not been immunized against hepatitis B (hepatitis B vaccine).  You have HIV or AIDS.  You use needles to inject street drugs.  You live with someone who has hepatitis B.  You have had sex with someone who has hepatitis B.  You get hemodialysis treatment.  You take certain medicines for conditions, including cancer, organ transplantation, and autoimmune conditions. Hepatitis C  Blood testing is recommended for:  Everyone born from [birth year] through 1966.  Anyone with known risk factors for hepatitis C. Sexually transmitted infections (STIs)  You should be screened for sexually transmitted infections (STIs) including gonorrhea and chlamydia if:  You are sexually active and are younger than 37 years of age.  You are older than 37 years of age and your health care provider tells you that you are at  risk for this type of infection.  Your sexual activity has changed since you were last screened and you are at an increased risk for  chlamydia or gonorrhea. Ask your health care provider if you are at risk.  If you do not have HIV, but are at risk, it may be recommended that you take a prescription medicine daily to prevent HIV infection. This is called pre-exposure prophylaxis (PrEP). You are considered at risk if:  You are sexually active and do not regularly use condoms or know the HIV status of your partner(s).  You take drugs by injection.  You are sexually active with a partner who has HIV. Talk with your health care provider about whether you are at high risk of being infected with HIV. If you choose to begin PrEP, you should first be tested for HIV. You should then be tested every 3 months for as long as you are taking PrEP. Pregnancy  If you are premenopausal and you may become pregnant, ask your health care provider about preconception counseling.  If you may become pregnant, take 400 to 800 micrograms (mcg) of folic acid every day.  If you want to prevent pregnancy, talk to your health care provider about birth control (contraception). Osteoporosis and menopause  Osteoporosis is a disease in which the bones lose minerals and strength with aging. This can result in serious bone fractures. Your risk for osteoporosis can be identified using a bone density scan.  If you are 57 years of age or older, or if you are at risk for osteoporosis and fractures, ask your health care provider if you should be screened.  Ask your health care provider whether you should take a calcium or vitamin D supplement to lower your risk for osteoporosis.  Menopause may have certain physical symptoms and risks.  Hormone replacement therapy may reduce some of these symptoms and risks. Talk to your health care provider about whether hormone replacement therapy is right for you. Follow these instructions at home:  Schedule regular health, dental, and eye exams.  Stay current with your immunizations.  Do not use any tobacco products  including cigarettes, chewing tobacco, or electronic cigarettes.  If you are pregnant, do not drink alcohol.  If you are breastfeeding, limit how much and how often you drink alcohol.  Limit alcohol intake to no more than 1 drink per day for nonpregnant women. One drink equals 12 ounces of beer, 5 ounces of wine, or 1 ounces of hard liquor.  Do not use street drugs.  Do not share needles.  Ask your health care provider for help if you need support or information about quitting drugs.  Tell your health care provider if you often feel depressed.  Tell your health care provider if you have ever been abused or do not feel safe at home. This information is not intended to replace advice given to you by your health care provider. Make sure you discuss any questions you have with your health care provider. Document Released: 02/26/2011 Document Revised: 01/19/2016 Document Reviewed: 05/17/2015  2017 Elsevier

## 2016-09-18 NOTE — Progress Notes (Signed)
Martha CassetteJessica Shannon 06-Aug-1981 [id number]    History:    Presents for annual exam. Monthly cycles until November amenorrheic since November 28, has had several negative home UPT's. Uses no contraception. Daughter [id number]. Reports normal Pap history. Primary care placed on Glucophage, Lexapro and asthma medication. Has not had follow-up in about a year. GDM.  Past medical history, past surgical history, family history and social history were all reviewed and documented in the EPIC chart. 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 Parents diabetes, father heart disease.  ROS:  A ROS was performed and pertinent positives and negatives are included.  Exam:  Vitals:   09/18/16 1428  BP: 130/88  Weight: 250 lb (113.4 kg)  Height: 5\' 1"  (1.549 m)   Body mass index is 47.24 kg/m.   General appearance:  Normal Thyroid:  Symmetrical, normal in size, without palpable masses or nodularity. Respiratory  Auscultation:  Clear without wheezing or rhonchi Cardiovascular  Auscultation:  Regular rate, without rubs, murmurs or gallops  Edema/varicosities:  Not grossly evident Abdominal  Soft,nontender, without masses, guarding or rebound.  Liver/spleen:  No organomegaly noted  Hernia:  None appreciated  Skin  Inspection:  Grossly normal   Breasts: Examined lying and sitting.     Right: Without masses, retractions, discharge or axillary adenopathy.     Left: Without masses, retractions, discharge or axillary adenopathy. Gentitourinary   Inguinal/mons:  Normal without inguinal adenopathy  External genitalia:  Normal  BUS/Urethra/Skene's glands:  Normal  Vagina:  Normal  Cervix:  Normal  Uterus:  normal in size, shape and contour.  Midline and mobile  Adnexa/parametria:     Rt: Without masses or tenderness.   Lt: Without masses or tenderness.  Anus and perineum: Normal  Digital rectal exam: Normal sphincter tone without palpated masses or tenderness  Assessment/Plan:  36 y.o. MWF G1 P1  for annual exam.     Amenorrhea Morbid obesity Type 2 diabetes on metformin per primary care Anxiety/depression on Lexapro per primary care  Plan: HCG qualitative, if negative withdrawal with Provera 10 for 5 days, will call for results. Contraception reviewed, declines pregnancy okay. CBC, lipid panel, hemoglobin A1c, FSH, TSH, prolactin, UA, Pap with HR HPV typing. New screening guidelines reviewed. Instructed to printout lab results and take to primary care for follow-up and refills of medication. SBE's, annual screening mammogram at 40, reviewed importance of increasing exercise and decreasing calories for weight loss. MVI daily encouraged.    Harrington ChallengerYOUNG,Martha Stephani J [facility] Medical CenterWHNP, 4:04 PM 09/18/2016

## 2016-09-19 ENCOUNTER — Other Ambulatory Visit: Payer: Self-pay | Admitting: Women's Health

## 2016-09-19 LAB — URINALYSIS W MICROSCOPIC + REFLEX CULTURE
BILIRUBIN URINE: NEGATIVE
Casts: NONE SEEN [LPF]
Crystals: NONE SEEN [HPF]
HGB URINE DIPSTICK: NEGATIVE
LEUKOCYTES UA: NEGATIVE
NITRITE: NEGATIVE
PH: 5.5 (ref 5.0–8.0)
PROTEIN: NEGATIVE
RBC / HPF: NONE SEEN RBC/HPF (ref <=2)
Specific Gravity, Urine: 1.03 (ref 1.001–1.035)

## 2016-09-19 LAB — HEMOGLOBIN A1C
Hgb A1c MFr Bld: 9.7 % — ABNORMAL HIGH (ref <5.7)
Mean Plasma Glucose: 232 mg/dL

## 2016-09-19 LAB — PAP, TP IMAGING W/ HPV RNA, RFLX HPV TYPE 16,18/45: HPV MRNA, HIGH RISK: NOT DETECTED

## 2016-09-19 LAB — HCG, SERUM, QUALITATIVE: PREG SERUM: NEGATIVE

## 2016-09-19 LAB — PROLACTIN: PROLACTIN: 11 ng/mL

## 2016-09-19 LAB — FOLLICLE STIMULATING HORMONE: FSH: 3.3 m[IU]/mL

## 2016-09-19 MED ORDER — MEDROXYPROGESTERONE ACETATE 10 MG PO TABS: 10.0000 mg | ORAL_TABLET | Freq: Every day | ORAL | 0 refills | Status: DC

## 2016-09-20 LAB — URINE CULTURE

## 2016-09-21 ENCOUNTER — Telehealth: Payer: Self-pay | Admitting: Behavioral Health

## 2016-09-21 NOTE — Telephone Encounter (Signed)
Unable to reach patient at time of Pre-Visit Call.  Left message for patient to return call when available.    

## 2016-09-24 ENCOUNTER — Encounter: Payer: Self-pay | Admitting: Family Medicine

## 2016-09-24 ENCOUNTER — Ambulatory Visit (INDEPENDENT_AMBULATORY_CARE_PROVIDER_SITE_OTHER): Payer: 59 | Admitting: Family Medicine

## 2016-09-24 ENCOUNTER — Telehealth: Payer: Self-pay | Admitting: Family Medicine

## 2016-09-24 VITALS — BP 135/89 | HR 87 | Temp 98.5°F | Ht 61.0 in | Wt 249.4 lb

## 2016-09-24 DIAGNOSIS — F418 Other specified anxiety disorders: Secondary | ICD-10-CM

## 2016-09-24 DIAGNOSIS — F329 Major depressive disorder, single episode, unspecified: Secondary | ICD-10-CM

## 2016-09-24 DIAGNOSIS — E1169 Type 2 diabetes mellitus with other specified complication: Secondary | ICD-10-CM | POA: Diagnosis not present

## 2016-09-24 DIAGNOSIS — E669 Obesity, unspecified: Secondary | ICD-10-CM | POA: Diagnosis not present

## 2016-09-24 DIAGNOSIS — F419 Anxiety disorder, unspecified: Secondary | ICD-10-CM

## 2016-09-24 DIAGNOSIS — F32A Depression, unspecified: Secondary | ICD-10-CM

## 2016-09-24 HISTORY — DX: Type 2 diabetes mellitus with other specified complication: E11.69

## 2016-09-24 HISTORY — DX: Type 2 diabetes mellitus with other specified complication: E66.9

## 2016-09-24 HISTORY — DX: Depression, unspecified: F32.A

## 2016-09-24 MED ORDER — SITAGLIPTIN PHOSPHATE 100 MG PO TABS: 100.0000 mg | ORAL_TABLET | Freq: Every day | ORAL | 1 refills | Status: DC

## 2016-09-24 MED ORDER — METFORMIN HCL ER 500 MG PO TB24: 1000.0000 mg | ORAL_TABLET | Freq: Every day | ORAL | 2 refills | Status: DC

## 2016-09-24 MED ORDER — LIRAGLUTIDE 18 MG/3ML ~~LOC~~ SOPN: 1.2000 mg | PEN_INJECTOR | Freq: Every day | SUBCUTANEOUS | 2 refills | Status: DC

## 2016-09-24 NOTE — Progress Notes (Signed)
Pre visit review using our clinic review tool, if applicable. No additional management support is needed unless otherwise documented below in the visit note. 

## 2016-09-24 NOTE — Telephone Encounter (Signed)
Victoza too expensive so it was D/C'd. Januvia called in. If this is too expensive, there is a [place] assistance card available at our office. Please call in Farxiga 5 mg daily in addition to providing pt with card if Januvia is not affordable. TY.

## 2016-09-24 NOTE — Progress Notes (Signed)
Chief Complaint  Patient presents with  . Establish Care    A1C levels have been on the high side. 251 this morning, 181 on another occasion.       New Patient Visit SUBJECTIVE: HPI: Martha MaduroJessica Shannon is an 36 y.o.female who is being seen for establishing care.  [facility]She is changing to our office due to insurance in network coverage.  The patient recently had her A1c checked at her obstetrician's. It was 9.7. She is currently on the extended release version of metformin taking 3 tabs at night daily. She was initially told to take 2 tabs twice daily. She states it has been giving her diarrhea and she is wondering if there are any other options. Admittedly, due to the side effects, she has not been taking her metformin routinely. The patient know she struggles with her weight and is making lifestyle changes to fix this. She has been walking a minimally, however does admit that when she gets home from work, she does not feel like doing anything. She is not on a statin, nor is she on an ACE inhibitor/arb. She is not on anything for contraception. Her last eye appointment was over 2 years ago. She's never had a "urine test for diabetes" (microalbumin/cr). She is unsure if she has ever had a pneumonia shot before. She is going to check her records on this.  No Known Allergies  Past Medical History:  Diagnosis Date  . Anxiety   . Anxiety and depression 09/24/2016  . Asthma   . Chicken pox    As a child.  . Depression   . Diabetes mellitus type 2 in obese (HCC) 09/24/2016  . Diabetes mellitus without complication (HCC)   . Fatty liver   . Kidney stones   . Migraines   . Obese   . UTI (urinary tract infection)    Past Surgical History:  Procedure Laterality Date  . CESAREAN SECTION     2006.   Social History   Social History  . Marital status: Married   Social History Main Topics  . Smoking status: Never Smoker  . Smokeless tobacco: Never  Used  . Alcohol use No  . Drug use: No  . Sexual activity: Yes    Birth control/ protection: None     Comment: 1ST INTERCOURSE- 14, PARTNERS -1   Family History  Problem Relation Age of Onset  . Diabetes Mother   . Diabetes Father   . Hypertension Father   . Heart disease Father   . Diabetes Sister   . Alzheimer's disease Paternal Grandmother   . Alzheimer's disease Paternal Grandfather     Current Outpatient Prescriptions:  .  Cyanocobalamin (VITAMIN B 12 PO), Take by mouth., Disp: , Rfl:  .  escitalopram (LEXAPRO) 20 MG tablet, Take 20 mg by mouth daily., Disp: , Rfl:  .  busPIRone (BUSPAR) 7.5 MG tablet, Take 1 tablet (7.5 mg total) by mouth 2 (two) times daily., Disp: , Rfl:  .  liraglutide 18 MG/3ML SOPN, Inject 0.2 mLs (1.2 mg total) into the skin daily. Take 0.6 (0.1 mL) daily for the first 2 weeks., Disp: 3 mL, Rfl: 2 .  metFORMIN (GLUCOPHAGE XR) 500 MG 24 hr tablet, Take 2 tablets (1,000 mg total) by mouth at bedtime., Disp: 60 tablet, Rfl: 2  No LMP recorded. Patient is not currently having periods (Reason: Other).  ROS Cardiovascular: Denies chest pain  Respiratory: Denies dyspnea   OBJECTIVE: BP 135/89 (  BP Location: Left Arm, Patient Position: Sitting, Cuff Size: Large)   Pulse 87   Temp 98.5 F (36.9 C) (Oral)   Ht 5\' 1"  (1.549 m)   Wt 249 lb 6.4 oz (113.1 kg)   SpO2 98% Comment: RA  BMI 47.12 kg/m   Constitutional: -  VS reviewed -  Well developed, well nourished, appears stated age -  No apparent distress  Psychiatric: -  Oriented to person, place, and time -  Memory intact -  Affect and mood normal -  Fluent conversation, good eye contact -  Judgment and insight age appropriate  Eye: -  Conjunctivae clear, no discharge -  Pupils symmetric, round, reactive to light  ENMT: -  Oral mucosa without lesions, tongue and uvula midline    Tonsils not enlarged, no erythema, no exudate, trachea midline    Pharynx moist, no lesions, no erythema  Neck: -  No  gross swelling, no palpable masses -  Thyroid midline, not enlarged, mobile, no palpable masses  Cardiovascular: -  RRR, no murmurs -  No LE edema  Respiratory: -  Normal respiratory effort, no accessory muscle use, no retraction -  Breath sounds equal, no wheezes, no ronchi, no crackles  Gastrointestinal: -  Bowel sounds normal -  No tenderness, no distention, no guarding, no masses  Musculoskeletal: -  No clubbing, no cyanosis -  Gait normal  Skin: - Circumferential and hyperpigmented skin around the neck -  Warm and dry to palpation   ASSESSMENT/PLAN: Diabetes mellitus type 2 in obese (HCC) - Plan: metFORMIN (GLUCOPHAGE XR) 500 MG 24 hr tablet, liraglutide 18 MG/3ML SOPN, Microalbumin / creatinine urine ratio  Anxiety and depression - Plan: busPIRone (BUSPAR) 7.5 MG tablet  Patient instructed to sign release of records form from her previous PCP. I would like her to take 2 tablets once daily as she is on the extended release form. If she is still having issues, decreased down to 1 tablet daily. Will add Victoza to help with weight loss and A1c reduction. I discussed this in a prole and statin medications and the role with diabetes. I also explained that if the woman were to get pregnant while on either of these medications, it would be catastrophic for the fetus. She states that she will reach out to her current eye care provider about a diabetic eye exam. She will let us know she needs a referral anywhere. She was counseled on diet and exercise as well as given written information. Patient should return in 6 weeks to recheck DM. The patient voiced understanding and agreement to the plan.   Jilda Roche [place], DO 09/24/16  5:25 PM

## 2016-09-24 NOTE — Patient Instructions (Signed)

## 2016-09-25 LAB — MICROALBUMIN / CREATININE URINE RATIO
CREATININE, U: 210.4 mg/dL
MICROALB UR: 10.2 mg/dL — AB (ref 0.0–1.9)
MICROALB/CREAT RATIO: 4.8 mg/g (ref 0.0–30.0)

## 2016-09-25 MED ORDER — DAPAGLIFLOZIN PROPANEDIOL 5 MG PO TABS: 5.0000 mg | ORAL_TABLET | Freq: Every day | ORAL | 3 refills | Status: DC

## 2016-09-25 NOTE — Telephone Encounter (Signed)
Rx for Martha DeistFarxiga has been sent to the pharmacy for pick up. TL/CMA

## 2016-10-01 ENCOUNTER — Telehealth: Payer: Self-pay

## 2016-10-01 MED FILL — busPIRone HCL 7.5 MG TABS: 7.5 | 45 days supply | Qty: 90 | Fill #0

## 2016-10-01 MED FILL — ESCITALOPRAM 20 MG TABLET: 20 | 90 days supply | Qty: 90 | Fill #0

## 2016-10-01 NOTE — Telephone Encounter (Signed)
PA initiated via Covermymeds; KEY: [id number]. Awaiting determination.

## 2016-10-08 NOTE — Telephone Encounter (Signed)
Prior Authorization for Farxiga 5 MG was declined. Sent to provider for review.

## 2016-10-11 ENCOUNTER — Other Ambulatory Visit: Payer: Self-pay

## 2016-10-11 MED ORDER — EMPAGLIFLOZIN 10 MG PO TABS: 10.0000 mg | ORAL_TABLET | Freq: Every day | ORAL | 0 refills | Status: DC

## 2016-10-11 NOTE — Telephone Encounter (Signed)
Insurance has denied Rx for [place] and per Dr . Carmelia RollerWendling he would like to switch to [place]. #90 tablets #0 refills. TL/CMA

## 2016-10-24 MED FILL — ESCITALOPRAM 10 MG TABLET: 10 | 90 days supply | Qty: 90 | Fill #0

## 2016-10-24 MED FILL — METFORMIN HCL ER 500 MG TAB: 500 | 30 days supply | Qty: 60 | Fill #0

## 2016-10-24 MED FILL — JANUVIA 100 MG TABLET: 100 | 30 days supply | Qty: 30 | Fill #0

## 2016-11-05 ENCOUNTER — Ambulatory Visit: Payer: Self-pay | Admitting: Family Medicine

## 2016-11-26 ENCOUNTER — Telehealth: Payer: Self-pay

## 2016-11-26 ENCOUNTER — Ambulatory Visit (INDEPENDENT_AMBULATORY_CARE_PROVIDER_SITE_OTHER): Payer: 59 | Admitting: Family Medicine

## 2016-11-26 ENCOUNTER — Encounter: Payer: Self-pay | Admitting: Family Medicine

## 2016-11-26 VITALS — BP 126/84 | HR 94 | Temp 98.5°F | Ht 61.0 in | Wt 251.4 lb

## 2016-11-26 DIAGNOSIS — E1169 Type 2 diabetes mellitus with other specified complication: Secondary | ICD-10-CM

## 2016-11-26 DIAGNOSIS — F329 Major depressive disorder, single episode, unspecified: Secondary | ICD-10-CM

## 2016-11-26 DIAGNOSIS — T300 Burn of unspecified body region, unspecified degree: Secondary | ICD-10-CM | POA: Diagnosis not present

## 2016-11-26 DIAGNOSIS — F418 Other specified anxiety disorders: Secondary | ICD-10-CM | POA: Diagnosis not present

## 2016-11-26 DIAGNOSIS — E669 Obesity, unspecified: Secondary | ICD-10-CM | POA: Diagnosis not present

## 2016-11-26 DIAGNOSIS — F419 Anxiety disorder, unspecified: Secondary | ICD-10-CM

## 2016-11-26 DIAGNOSIS — F32A Depression, unspecified: Secondary | ICD-10-CM

## 2016-11-26 MED ORDER — SITAGLIPTIN PHOSPHATE 100 MG PO TABS: 100.0000 mg | ORAL_TABLET | Freq: Every day | ORAL | 1 refills | Status: DC

## 2016-11-26 MED ORDER — DAPAGLIFLOZIN PROPANEDIOL 5 MG PO TABS: 5.0000 mg | ORAL_TABLET | Freq: Every day | ORAL | 2 refills | Status: DC

## 2016-11-26 MED ORDER — SILVER SULFADIAZINE 1 % EX CREA: 1.0000 "application " | TOPICAL_CREAM | Freq: Every day | CUTANEOUS | 1 refills | Status: DC

## 2016-11-26 MED ORDER — BUSPIRONE HCL 7.5 MG PO TABS: 7.5000 mg | ORAL_TABLET | Freq: Two times a day (BID) | ORAL | 1 refills | Status: DC

## 2016-11-26 MED ORDER — METFORMIN HCL ER 500 MG PO TB24: 1000.0000 mg | ORAL_TABLET | Freq: Every day | ORAL | 1 refills | Status: DC

## 2016-11-26 MED FILL — busPIRone HCL 7.5 MG TABS: 7.5 | 90 days supply | Qty: 180 | Fill #0

## 2016-11-26 MED FILL — FARXIGA 5 MG TABLET: 5 | 30 days supply | Qty: 30 | Fill #0

## 2016-11-26 MED FILL — SSD 1% CREAM: 1 | 30 days supply | Qty: 50 | Fill #0

## 2016-11-26 MED FILL — METFORMIN HCL ER 500 MG TAB: 500 | 90 days supply | Qty: 180 | Fill #0

## 2016-11-26 MED FILL — JANUVIA 100 MG TABLET: 100 | 90 days supply | Qty: 90 | Fill #0

## 2016-11-26 NOTE — Patient Instructions (Addendum)
Do not get pregnant on this medicine Marcelline Deist).  Find out about your pneumonia vaccine status.

## 2016-11-26 NOTE — Progress Notes (Signed)
Subjective:   Chief Complaint  Patient presents with  . Follow-up    6 weeks on DM and  med refill  . Burn on (R) arm    since last 06.    Social History   Social History  . Marital status: Married   Social History Main Topics  . Smoking status: Never Smoker  . Smokeless tobacco: Never Used  . Alcohol use No  . Drug use: No  . Sexual activity: Yes    Birth control/ protection: None     Comment: 1ST INTERCOURSE- 75, PARTNERS -1   Outpatient Encounter Prescriptions as of 11/26/2016  Medication Sig  . busPIRone (BUSPAR) 7.5 MG tablet Take 1 tablet (7.5 mg total) by mouth 2 (two) times daily.  Marland Kitchen escitalopram (LEXAPRO) 20 MG tablet Take 20 mg by mouth daily.  . metFORMIN (GLUCOPHAGE XR) 500 MG 24 hr tablet Take 2 tablets (1,000 mg total) by mouth at bedtime.  . sitaGLIPtin (JANUVIA) 100 MG tablet Take 1 tablet (100 mg total) by mouth daily.  . Cyanocobalamin (VITAMIN B 12 PO) Take by mouth.  . dapagliflozin propanediol (FARXIGA) 5 MG TABS  tablet Take 5 mg by mouth daily.  . silver sulfADIAZINE (SILVADENE) 1 % cream Apply 1 application topically daily.     Related testing: Foot exam(monofilament and inspection):done Retinal exam:done Date of retinal exam: coming up Pneumovax: Unsure Flu Shot: done  Review of Systems: Eye:  No recent significant change in vision Pulmonary:  No SOB Cardiovascular:  No chest pain, no palpitations Skin/Integumentary ROS:  No abnormal skin lesions reported Neurologic:  No numbness, tingling  Objective:  BP 126/84 (BP Location: Left Arm, Patient Position: Sitting, Cuff Size: Large)   Pulse 94   Temp 98.5 F (36.9 C) (Oral)   Ht  (1.549 m)   Wt 251 lb 6.4 oz (114 kg)   LMP 11/18/2016   SpO2 90%   BMI 47.50 kg/m  General:  Well developed, well nourished, in no apparent distress Skin:  Various hyperpigmented macules and patches on dorsal R forearm with scaling, some lesions open, no signs of infection such as drainage, fluctuance, erythema, or TTP. Head:  Normocephalic, atraumatic Eyes:  Pupils equal and round, sclera anicteric without injection  Nose:  External nares without trauma, no discharge Throat/Pharynx:  Lips and gingiva without lesion Neck: Neck supple.  No obvious thyromegaly or masses.  No bruits Lungs:  clear to auscultation, breath sounds equal bilaterally, no wheezes, rales, or stridor Cardio:  regular rate and rhythm without murmurs, no bruits, no LE edema Musculoskeletal:  Symmetrical muscle groups noted without atrophy or deformity Neuro:  Sensation intact to pinprick on feet Psych: Age appropriate judgment and insight  Assessment:   Diabetes mellitus type 2 in obese (HCC) - Plan: dapagliflozin propanediol (FARXIGA) 5 MG TABS tablet, metFORMIN (GLUCOPHAGE XR) 500 MG 24 hr tablet, sitaGLIPtin (JANUVIA) 100 MG tablet, HM Diabetes Foot Exam  Burn - Plan: silver sulfADIAZINE (SILVADENE) 1 % cream  Anxiety and depression - Plan: busPIRone (BUSPAR) 7.5 MG tablet    Plan:   Orders as above. Start [place]. Patient has no interest in getting pregnant, she was counseled not to do so while on Farxiga. Instructed to keep areas of burn clean and dry. Silvadene given for infection prevention. She will check her records about the PNA vaccine. F/u in 6 weeks to recheck A1c and see how she is doing on new medicine. The patient voiced understanding and agreement to the plan.  Jilda Roche [place], DO 11/26/16 4:57 PM

## 2016-11-26 NOTE — Telephone Encounter (Signed)
PA initiated via Covermymeds; KEY: [id number]. Awaiting determination.

## 2016-11-26 NOTE — Progress Notes (Signed)
Pre visit review using our clinic review tool, if applicable. No additional management support is needed unless otherwise documented below in the visit note. 

## 2016-12-03 ENCOUNTER — Telehealth: Payer: Self-pay | Admitting: *Deleted

## 2016-12-03 NOTE — Telephone Encounter (Signed)
Received PA approval for Farxiga 5mg.  The authorization is effective for a maximum of 12 fills from 11/27/2016 to 11/26/2017.  Sent for scanning.//AB/CMA 

## 2016-12-10 NOTE — Telephone Encounter (Signed)
Received PA approval for Farxiga .  The authorization is effective for a maximum of 12 fills from 11/27/2016 to 11/26/2017.  Sent for scanning.//AB/CMA

## 2016-12-21 MED FILL — FARXIGA 5 MG TABLET: 5 | 30 days supply | Qty: 30 | Fill #1

## 2017-01-07 ENCOUNTER — Ambulatory Visit: Payer: Self-pay | Admitting: Family Medicine

## 2017-01-30 MED FILL — FARXIGA 5 MG TABLET: 5 | 30 days supply | Qty: 30 | Fill #2

## 2017-03-04 MED FILL — JANUVIA 100 MG TABLET: 100 | 90 days supply | Qty: 90 | Fill #1

## 2017-03-04 MED FILL — METFORMIN HCL ER 500 MG TAB: 500 | 90 days supply | Qty: 180 | Fill #1

## 2017-03-04 MED FILL — FARXIGA 5 MG TABLET: 5 | 30 days supply | Qty: 30 | Fill #0

## 2017-04-04 MED FILL — FARXIGA 5 MG TABLET: 5 | 30 days supply | Qty: 30 | Fill #1

## 2017-04-17 ENCOUNTER — Encounter: Payer: Self-pay | Admitting: Family Medicine

## 2017-04-17 NOTE — Telephone Encounter (Signed)
Please reach out to patient to schedule appt. OK to use [phone] slot if more convenient.

## 2017-04-19 ENCOUNTER — Encounter: Payer: Self-pay | Admitting: Family Medicine

## 2017-04-19 ENCOUNTER — Ambulatory Visit (INDEPENDENT_AMBULATORY_CARE_PROVIDER_SITE_OTHER): Payer: 59 | Admitting: Family Medicine

## 2017-04-19 ENCOUNTER — Other Ambulatory Visit: Payer: Self-pay | Admitting: Family Medicine

## 2017-04-19 VITALS — BP 122/82 | HR 85 | Temp 98.3°F | Ht 61.0 in | Wt 249.5 lb

## 2017-04-19 DIAGNOSIS — F419 Anxiety disorder, unspecified: Secondary | ICD-10-CM | POA: Diagnosis not present

## 2017-04-19 DIAGNOSIS — E1169 Type 2 diabetes mellitus with other specified complication: Secondary | ICD-10-CM | POA: Diagnosis not present

## 2017-04-19 DIAGNOSIS — R7401 Elevation of levels of liver transaminase levels: Secondary | ICD-10-CM

## 2017-04-19 DIAGNOSIS — F32A Depression, unspecified: Secondary | ICD-10-CM

## 2017-04-19 DIAGNOSIS — E669 Obesity, unspecified: Secondary | ICD-10-CM

## 2017-04-19 DIAGNOSIS — F329 Major depressive disorder, single episode, unspecified: Secondary | ICD-10-CM

## 2017-04-19 DIAGNOSIS — R740 Nonspecific elevation of levels of transaminase and lactic acid dehydrogenase [LDH]: Secondary | ICD-10-CM

## 2017-04-19 DIAGNOSIS — D509 Iron deficiency anemia, unspecified: Secondary | ICD-10-CM

## 2017-04-19 LAB — COMPREHENSIVE METABOLIC PANEL
ALBUMIN: 3.9 g/dL (ref 3.5–5.2)
ALT: 71 U/L — AB (ref 0–35)
AST: 40 U/L — AB (ref 0–37)
Alkaline Phosphatase: 51 U/L (ref 39–117)
BILIRUBIN TOTAL: 0.3 mg/dL (ref 0.2–1.2)
BUN: 10 mg/dL (ref 6–23)
CHLORIDE: 103 meq/L (ref 96–112)
CO2: 28 mEq/L (ref 19–32)
CREATININE: 0.54 mg/dL (ref 0.40–1.20)
Calcium: 9.3 mg/dL (ref 8.4–10.5)
GFR: 134.99 mL/min (ref >60.00)
Glucose, Bld: 131 mg/dL — ABNORMAL HIGH (ref 70–99)
Potassium: 4 mEq/L (ref 3.5–5.1)
SODIUM: 138 meq/L (ref 135–145)
Total Protein: 6.9 g/dL (ref 6.0–8.3)

## 2017-04-19 LAB — CBC
HCT: 38.3 % (ref 36.0–46.0)
Hemoglobin: 11.8 g/dL — ABNORMAL LOW (ref 12.0–15.0)
MCHC: 30.7 g/dL (ref 30.0–36.0)
MCV: 73 fl — AB (ref 78.0–100.0)
Platelets: 418 10*3/uL — ABNORMAL HIGH (ref 150.0–400.0)
RBC: 5.24 Mil/uL — AB (ref 3.87–5.11)
RDW: 18.5 % — ABNORMAL HIGH (ref 11.5–15.5)
WBC: 9.9 10*3/uL (ref 4.0–10.5)

## 2017-04-19 LAB — LIPID PANEL
CHOL/HDL RATIO: 4
CHOLESTEROL: 167 mg/dL (ref 0–200)
HDL: 38.7 mg/dL — ABNORMAL LOW (ref >39.00)
LDL CALC: 116 mg/dL — AB (ref 0–99)
NonHDL: 128.68
Triglycerides: 61 mg/dL (ref 0.0–149.0)
VLDL: 12.2 mg/dL (ref 0.0–40.0)

## 2017-04-19 LAB — HEMOGLOBIN A1C: HEMOGLOBIN A1C: 7.2 % — AB (ref 4.6–6.5)

## 2017-04-19 LAB — TSH: TSH: 1.53 u[IU]/mL (ref 0.35–4.50)

## 2017-04-19 MED ORDER — GLUCOSE BLOOD VI STRP: ORAL_STRIP | 6 refills | Status: DC

## 2017-04-19 MED ORDER — HYDROXYZINE HCL 50 MG PO TABS: 50.0000 mg | ORAL_TABLET | Freq: Three times a day (TID) | ORAL | 1 refills | Status: DC | PRN

## 2017-04-19 MED ORDER — ACCU-CHEK SOFTCLIX LANCETS MISC: 6 refills | Status: DC

## 2017-04-19 MED ORDER — FLUOXETINE HCL 40 MG PO CAPS: 40.0000 mg | ORAL_CAPSULE | Freq: Every day | ORAL | 3 refills | Status: DC

## 2017-04-19 MED ORDER — DAPAGLIFLOZIN PROPANEDIOL 5 MG PO TABS: 5.0000 mg | ORAL_TABLET | Freq: Every day | ORAL | 5 refills | Status: DC

## 2017-04-19 MED FILL — FLUoxetine HCL 40 MG CAPS: 40 | 30 days supply | Qty: 30 | Fill #0

## 2017-04-19 MED FILL — hydrOXYzine HCL 50 MG TABS: 50 | 30 days supply | Qty: 90 | Fill #0

## 2017-04-19 MED FILL — ACCU-CHEK GUIDE TEST STRIP: 90 days supply | Qty: 100 | Fill #0

## 2017-04-19 MED FILL — ACCU-CHEK FASTCLIX LANCETS: 90 days supply | Qty: 102 | Fill #0

## 2017-04-19 NOTE — Progress Notes (Signed)
Lab orders placed. Pt notified via MyChart.

## 2017-04-19 NOTE — Patient Instructions (Signed)
Give [place] 2-3 business days to get the results of your labs back.  Aim to do some physical exertion for 150 minutes per week. This is typically divided into 5 days per week, 30 minutes per day. The activity should be enough to get your heart rate up. Anything is better than nothing if you have time constraints. Lift weights!  Please consider counseling. The medical literature and evidence-based guidelines support it. Contact [phone] to schedule an appointment or inquire about cost/insurance coverage.  Send me a MyChart message in 2 weeks if we are not improving or getting worse. I will send you instructions about changing the buspirone.   Let us know if you need anything.

## 2017-04-19 NOTE — Progress Notes (Signed)
Pre visit review using our clinic review tool, if applicable. No additional management support is needed unless otherwise documented below in the visit note. 

## 2017-04-19 NOTE — Progress Notes (Signed)
Chief Complaint  Patient presents with  . Anxiety    Subjective Martha Shannon presents for f/u anxiety/depression.  Reports overall success since treatemnt. Has tried Lexapro and BuSpar. She was doing well, however the past 2-3 weeks, things got much worse. She is responsible not only for taking care of her daughter, but also 2 of her nephews. This change has caused her extreme stress. She is married. She is having panic attacks and periods of extreme sorrow/depression and anhedonia. Reports healthy diet overall.  Reports getting routine exercise- walking. No thoughts of harming self or others. No self-medication with alcohol, prescription drugs or illicit drugs. She does not follow with counseling or psychology  DM II She checks her sugars and gets low 100's. She is compliant with metformin XR 1000 mg daily, Farxiga 5 mg daily, Januvia 100 mg daily- she is tolerating them well.  She is walking. Her diet is healthy overall. She is due for her eye exam.  She does not remember if she has had the pneumonia vaccine or not. The patient states that her prior provider is to be sending over her previous records as of this week. She is required to the flu shot every year.  ROS Psych: No homicidal or suicidal thoughts Cardiac: No CP  Past Medical History:  Diagnosis Date  . Anxiety   . Anxiety and depression 09/24/2016  . Asthma   . Chicken pox    As a child.  . Depression   . Diabetes mellitus type 2 in obese (HCC) 09/24/2016  . Diabetes mellitus without complication (HCC)   . Fatty liver   . Kidney stones   . Migraines   . Obese   . UTI (urinary tract infection)    Family History  Problem Relation Age of Onset  . Diabetes Mother   . Diabetes Father   . Hypertension Father   . Heart disease Father   . Diabetes Sister   . Alzheimer's disease Paternal Grandmother   . Alzheimer's disease Paternal Grandfather    Allergies as of 04/19/2017   No Known Allergies      Medication List       Accurate as of 04/19/17  7:42 AM. Always use your most recent med list.          ACCU-CHEK SOFTCLIX LANCETS lancets Use as directed once daily to check blood sugar.  E11.9   busPIRone 7.5 MG tablet Commonly known as:  BUSPAR Take 1 tablet (7.5 mg total) by mouth 2 (two) times daily.   dapagliflozin propanediol 5 MG Tabs tablet Commonly known as:  FARXIGA Take 5 mg by mouth daily.   FLUoxetine 40 MG capsule Commonly known as:  PROZAC Take 1 capsule (40 mg total) by mouth daily.   glucose blood test strip Commonly known as:  ACCU-CHEK AVIVA Use as directed once daily to check blood sugar.  E11.9   hydrOXYzine 50 MG tablet Commonly known as:  ATARAX/VISTARIL Take 1 tablet (50 mg total) by mouth 3 (three) times daily as needed for anxiety.   metFORMIN 500 MG 24 hr tablet Commonly known as:  GLUCOPHAGE XR Take 2 tablets (1,000 mg total) by mouth at bedtime.   silver sulfADIAZINE 1 % cream Commonly known as:  SILVADENE Apply 1 application topically daily.   sitaGLIPtin 100 MG tablet Commonly known as:  JANUVIA Take 1 tablet (100 mg total) by mouth daily.   VITAMIN B 12 PO Take by mouth.    Exam BP 122/82 (BP Location: Left Arm,  Patient Position: Sitting, Cuff Size: Large)   Pulse 85   Temp 98.3 F (36.8 C) (Oral)   Ht 5\' 1"  (1.549 m)   Wt 249 lb 8 oz (113.2 kg)   SpO2 98%   BMI 47.14 kg/m  General:  well developed, well nourished, in no apparent distress Neck: neck supple without adenopathy, thyromegaly, or masses Lungs:  clear to auscultation, breath sounds equal bilaterally, no respiratory distress Cardio:  regular rate and rhythm without murmurs, heart sounds without clicks or rubs Psych: well oriented with normal range of affect and age-appropriate judgement/insight, alert and oriented x4.  Assessment and Plan  Anxiety and depression - Plan: TSH, FLUoxetine (PROZAC) 40 MG capsule, hydrOXYzine (ATARAX/VISTARIL) 50 MG tablet,  DISCONTINUED: FLUoxetine (PROZAC) 40 MG capsule, DISCONTINUED: hydrOXYzine (ATARAX/VISTARIL) 50 MG tablet  Diabetes mellitus type 2 in obese (HCC) - Plan: CBC, Comprehensive metabolic panel, Lipid panel, Hemoglobin A1c, dapagliflozin propanediol (FARXIGA) 5 MG TABS tablet, DISCONTINUED: dapagliflozin propanediol (FARXIGA) 5 MG TABS tablet   Orders as above. Change Lexapro to Prozac. Add Atarax prn, she is not interested in addictive medicines. She will send [place] a MyChart message if she is not noticing improvement over the next 2 weeks and I will adjust her BuSPAR. Counseled on diet and exercise affecting mood. Number for counseling given. Encouraged her to lift weights in addition to walking. Needs eye exam, she will set up. Let us know if she needs referral and I will place.  F/u in 6 weeks. The patient voiced understanding and agreement to the plan.  Jilda Roche [place], DO 04/19/17 7:42 AM

## 2017-04-22 ENCOUNTER — Telehealth: Payer: Self-pay | Admitting: Family Medicine

## 2017-04-22 NOTE — Telephone Encounter (Signed)
Patient returning you call for labs

## 2017-04-22 NOTE — Telephone Encounter (Signed)
See result notes. 

## 2017-04-24 ENCOUNTER — Other Ambulatory Visit: Payer: 59

## 2017-04-25 ENCOUNTER — Other Ambulatory Visit: Payer: 59

## 2017-04-30 ENCOUNTER — Ambulatory Visit (INDEPENDENT_AMBULATORY_CARE_PROVIDER_SITE_OTHER): Payer: 59 | Admitting: Family Medicine

## 2017-04-30 DIAGNOSIS — D509 Iron deficiency anemia, unspecified: Secondary | ICD-10-CM

## 2017-04-30 DIAGNOSIS — R7401 Elevation of levels of liver transaminase levels: Secondary | ICD-10-CM

## 2017-04-30 DIAGNOSIS — R740 Nonspecific elevation of levels of transaminase and lactic acid dehydrogenase [LDH]: Secondary | ICD-10-CM

## 2017-04-30 LAB — IBC PANEL
Iron: 19 ug/dL — ABNORMAL LOW (ref 42–145)
Saturation Ratios: 3.4 % — ABNORMAL LOW (ref 20.0–50.0)
TRANSFERRIN: 396 mg/dL — AB (ref 212.0–360.0)

## 2017-04-30 LAB — CBC
HCT: 39.3 % (ref 36.0–46.0)
HEMOGLOBIN: 12.2 g/dL (ref 12.0–15.0)
MCHC: 30.9 g/dL (ref 30.0–36.0)
MCV: 72.1 fl — AB (ref 78.0–100.0)
PLATELETS: 412 10*3/uL — AB (ref 150.0–400.0)
RBC: 5.46 Mil/uL — ABNORMAL HIGH (ref 3.87–5.11)
RDW: 18.1 % — ABNORMAL HIGH (ref 11.5–15.5)
WBC: 9.9 10*3/uL (ref 4.0–10.5)

## 2017-04-30 LAB — FERRITIN: Ferritin: 12.7 ng/mL (ref 10.0–291.0)

## 2017-05-01 LAB — HEPATITIS B SURFACE ANTIGEN: HEP B S AG: NONREACTIVE

## 2017-05-01 LAB — HEPATITIS C ANTIBODY: HCV Ab: NONREACTIVE

## 2017-05-02 NOTE — Progress Notes (Signed)
Noted  

## 2017-05-06 MED FILL — FARXIGA 5 MG TABLET: 5 | 30 days supply | Qty: 30 | Fill #2

## 2017-05-17 ENCOUNTER — Telehealth: Payer: Self-pay | Admitting: *Deleted

## 2017-05-17 NOTE — Telephone Encounter (Signed)
Received Medical records from [facility]; forwarded to provider/SLS 09/21  

## 2017-05-20 MED FILL — hydrOXYzine HCL 50 MG TABS: 50 | 30 days supply | Qty: 90 | Fill #1

## 2017-05-20 MED FILL — FLUoxetine HCL 40 MG CAPS: 40 | 30 days supply | Qty: 30 | Fill #1

## 2017-05-31 ENCOUNTER — Encounter: Payer: Self-pay | Admitting: Family Medicine

## 2017-05-31 ENCOUNTER — Ambulatory Visit (INDEPENDENT_AMBULATORY_CARE_PROVIDER_SITE_OTHER): Payer: 59 | Admitting: Family Medicine

## 2017-05-31 VITALS — BP 118/82 | HR 82 | Temp 98.3°F | Ht 61.0 in | Wt 248.2 lb

## 2017-05-31 DIAGNOSIS — F329 Major depressive disorder, single episode, unspecified: Secondary | ICD-10-CM | POA: Diagnosis not present

## 2017-05-31 DIAGNOSIS — F419 Anxiety disorder, unspecified: Secondary | ICD-10-CM

## 2017-05-31 DIAGNOSIS — E1169 Type 2 diabetes mellitus with other specified complication: Secondary | ICD-10-CM | POA: Diagnosis not present

## 2017-05-31 DIAGNOSIS — E669 Obesity, unspecified: Secondary | ICD-10-CM

## 2017-05-31 DIAGNOSIS — F32A Depression, unspecified: Secondary | ICD-10-CM

## 2017-05-31 NOTE — Progress Notes (Signed)
Chief Complaint  Patient presents with  . Follow-up    anxiety    Subjective Martha Shannon presents for f/u anxiety/depression.  She is currently being treated with Prozac, BuSpar and Hydroxyzine.  Reports 50% improvement since changing to Prozac. She feels she is still improving. Has failed Zoloft. Reports healthy diet overall- getting better. Reports not getting routine exercise. No thoughts of harming self or others. No self-medication with alcohol, prescription drugs or illicit drugs. She is not following with a counselor/psychologist. She notes she is a very private person.  Discussed briefly her diabetes. She is getting low 100's for morning reads, which is improving. Her diet is improving, she is not physically active. She is checking her sugars daily. From January to August of this year, her A1c decreased from 9.7 to 7.2. She is on Metformin, Januvia and [place].   ROS Psych: No homicidal or suicidal thoughts  Past Medical History:  Diagnosis Date  . Anxiety   . Anxiety and depression 09/24/2016  . Asthma   . Chicken pox    As a child.  . Depression   . Diabetes mellitus type 2 in obese (HCC) 09/24/2016  . Diabetes mellitus without complication (HCC)   . Fatty liver   . Kidney stones   . Migraines   . Obese   . UTI (urinary tract infection)    Family History  Problem Relation Age of Onset  . Diabetes Mother   . Diabetes Father   . Hypertension Father   . Heart disease Father   . Diabetes Sister   . Alzheimer's disease Paternal Grandmother   . Alzheimer's disease Paternal Grandfather    Allergies as of 05/31/2017   No Known Allergies     Medication List       Accurate as of 05/31/17  7:31 AM. Always use your most recent med list.          ACCU-CHEK SOFTCLIX LANCETS lancets Use as directed once daily to check blood sugar.  E11.9   busPIRone 7.5 MG tablet Commonly known as:  BUSPAR Take 1 tablet (7.5 mg total) by mouth 2 (two) times daily.    dapagliflozin propanediol 5 MG Tabs tablet Commonly known as:  FARXIGA Take 5 mg by mouth daily.   FLUoxetine 40 MG capsule Commonly known as:  PROZAC Take 1 capsule (40 mg total) by mouth daily.   glucose blood test strip Commonly known as:  ACCU-CHEK AVIVA Use as directed once daily to check blood sugar.  E11.9   hydrOXYzine 50 MG tablet Commonly known as:  ATARAX/VISTARIL Take 1 tablet (50 mg total) by mouth 3 (three) times daily as needed for anxiety.   metFORMIN 500 MG 24 hr tablet Commonly known as:  GLUCOPHAGE XR Take 2 tablets (1,000 mg total) by mouth at bedtime.   silver sulfADIAZINE 1 % cream Commonly known as:  SILVADENE Apply 1 application topically daily.   sitaGLIPtin 100 MG tablet Commonly known as:  JANUVIA Take 1 tablet (100 mg total) by mouth daily.   VITAMIN B 12 PO Take by mouth.      Exam BP 118/82 (BP Location: Left Arm, Patient Position: Sitting, Cuff Size: Large)   Pulse 82   Temp 98.3 F (36.8 C) (Oral)   Ht  (1.549 m)   Wt 248 lb 4 oz (112.6 kg)   SpO2 96%   BMI 46.91 kg/m  General:  well developed, well nourished, in no apparent distress Lungs:  no access msc use or respiratory  distress Psych: well oriented with normal range of affect and age-appropriate judgement/insight, alert and oriented x4.  Assessment and Plan  Anxiety and depression  Diabetes mellitus type 2 in obese (HCC)  Offered to change medication regimen, pt declined at this time. Encouraged her to consider counseling if we hit a plateau. Send MyChart message if we do not cont to improve. Social stressors with child care situation major root cause.  Counseled on diet and exercise affecting mood. OK to check sugars 2-3 times per week.  F/u in 8 weeks to recheck DM and mood. I think her A1c will be much improved.  The patient voiced understanding and agreement to the plan.  Jilda Roche [place], DO 05/31/17 7:31 AM

## 2017-05-31 NOTE — Progress Notes (Signed)
Pre visit review using our clinic review tool, if applicable. No additional management support is needed unless otherwise documented below in the visit note. 

## 2017-05-31 NOTE — Patient Instructions (Addendum)
Coping skills Choose 5 that work for you:  Take a deep breath  Count to 20  Read a book  Do a puzzle  Meditate  Bake  Sing  Knit  Garden  Pray  Go outside  Call a friend  Listen to music  Take a walk  Color  Send a note  Take a bath  Watch a movie  Be alone in a quiet place  Pet an animal  Visit a friend  Journal  Exercise  Stretch   Sleep Hygiene Tips:  Do not watch TV or look at screens within 1 hour of going to bed. If you do, make sure there is a blue light filter (nighttime mode) involved.  Try to go to bed around the same time every night. Wake up at the same time within 1 hour of regular time. Ex: If you wake up at 7 AM for work, do not sleep past 8 AM on days that you don't work.  Do not drink alcohol before bedtime.  Do not consume caffeine-containing beverages after noon or within 9 hours of intended bedtime.  Get regular exercise/physical activity in your life, but not within 2 hours of planned bedtime.  Do not take naps.   Do not eat within 2 hours of planned bedtime.  Melatonin, 3-5 mg 30-60 minutes before planned bedtime may be helpful.   The bed should be for sleep or sex only. If after 20-30 minutes you are unable to fall asleep, get up and do something relaxing. Do this until you feel ready to go to sleep again.   Send me a MyChart message if we plateau in our progress and you would like to make changes.

## 2017-06-05 ENCOUNTER — Other Ambulatory Visit: Payer: Self-pay | Admitting: Family Medicine

## 2017-06-05 DIAGNOSIS — E669 Obesity, unspecified: Principal | ICD-10-CM

## 2017-06-05 DIAGNOSIS — E1169 Type 2 diabetes mellitus with other specified complication: Secondary | ICD-10-CM

## 2017-06-05 MED FILL — FARXIGA 5 MG TABLET: 5 | 30 days supply | Qty: 30 | Fill #3

## 2017-06-06 ENCOUNTER — Encounter: Payer: Self-pay | Admitting: Family Medicine

## 2017-06-06 DIAGNOSIS — F32A Depression, unspecified: Secondary | ICD-10-CM

## 2017-06-06 DIAGNOSIS — F329 Major depressive disorder, single episode, unspecified: Secondary | ICD-10-CM

## 2017-06-06 DIAGNOSIS — E669 Obesity, unspecified: Principal | ICD-10-CM

## 2017-06-06 DIAGNOSIS — E1169 Type 2 diabetes mellitus with other specified complication: Secondary | ICD-10-CM

## 2017-06-06 DIAGNOSIS — F419 Anxiety disorder, unspecified: Secondary | ICD-10-CM

## 2017-06-06 MED FILL — METFORMIN HCL ER 500 MG TAB: 500 | 90 days supply | Qty: 180 | Fill #0

## 2017-06-06 MED FILL — JANUVIA 100 MG TABLET: 100 | 90 days supply | Qty: 90 | Fill #0

## 2017-06-07 MED ORDER — SITAGLIPTIN PHOSPHATE 100 MG PO TABS: 100.0000 mg | ORAL_TABLET | Freq: Every day | ORAL | 1 refills | Status: DC

## 2017-06-07 MED ORDER — METFORMIN HCL ER 500 MG PO TB24: 1000.0000 mg | ORAL_TABLET | Freq: Every day | ORAL | 1 refills | Status: DC

## 2017-06-07 MED ORDER — HYDROXYZINE HCL 50 MG PO TABS: 50.0000 mg | ORAL_TABLET | Freq: Three times a day (TID) | ORAL | 1 refills | Status: DC | PRN

## 2017-06-12 ENCOUNTER — Telehealth: Payer: 59 | Admitting: Family

## 2017-06-12 DIAGNOSIS — R112 Nausea with vomiting, unspecified: Secondary | ICD-10-CM | POA: Diagnosis not present

## 2017-06-12 DIAGNOSIS — R519 Headache, unspecified: Secondary | ICD-10-CM

## 2017-06-12 DIAGNOSIS — R51 Headache: Secondary | ICD-10-CM | POA: Diagnosis not present

## 2017-06-12 MED ORDER — ONDANSETRON HCL 4 MG PO TABS: 4.0000 mg | ORAL_TABLET | Freq: Three times a day (TID) | ORAL | 0 refills | Status: DC | PRN

## 2017-06-12 MED FILL — ONDANSETRON HCL 4 MG TABLET: 4 | 6 days supply | Qty: 20 | Fill #0

## 2017-06-12 NOTE — Progress Notes (Signed)
We are sorry that you are not feeling well. Here is how we plan to help!  Based on what you have shared with me it looks like you have a migraine with nausea. I will send in zofran prescription that will help with the nausea. If your headache does not improve or worsens you will need to see your primary care provider.  I have prescribed a medication that will help alleviate your symptoms and allow you to stay hydrated:  Zofran 4 mg 1 tablet every 8 hours as needed for nausea and vomiting  HOME CARE:  Drink clear liquids.  This is very important! Dehydration (the lack of fluid) can lead to a serious complication.  Start off with 1 tablespoon every 5 minutes for 8 hours.  You may begin eating bland foods after 8 hours without vomiting.  Start with saltine crackers, white bread, rice, mashed potatoes, applesauce.  After 48 hours on a bland diet, you may resume a normal diet.  Try to go to sleep.  Sleep often empties the stomach and relieves the need to vomit.  GET HELP RIGHT AWAY IF:   Your symptoms do not improve or worsen within 2 days after treatment.  You have a fever for over 3 days.  You cannot keep down fluids after trying the medication.  MAKE SURE YOU:   Understand these instructions.  Will watch your condition.  Will get help right away if you are not doing well or get worse.   Thank you for choosing an e-visit. Your e-visit answers were reviewed by a board certified advanced clinical practitioner to complete your personal care plan. Depending upon the condition, your plan could have included both over the counter or prescription medications. Please review your pharmacy choice. Be sure that the pharmacy you have chosen is open so that you can pick up your prescription now.  If there is a problem you may message your provider in MyChart to have the prescription routed to another pharmacy. Your safety is important to us. If you have drug allergies check your prescription  carefully.  For the next 24 hours, you can use MyChart to ask questions about today's visit, request a non-urgent call back, or ask for a work or school excuse from your e-visit provider. You will get an e-mail in the next two days asking about your experience. I hope that your e-visit has been valuable and will speed your recovery.

## 2017-06-17 MED FILL — FLUoxetine HCL 40 MG CAPS: 40 | 30 days supply | Qty: 30 | Fill #2

## 2017-07-08 MED FILL — FARXIGA 5 MG TABLET: 5 | 30 days supply | Qty: 30 | Fill #0

## 2017-07-12 MED FILL — FLUoxetine HCL 40 MG CAPS: 40 | 30 days supply | Qty: 30 | Fill #3

## 2017-07-18 ENCOUNTER — Telehealth: Payer: 59 | Admitting: Family

## 2017-07-18 DIAGNOSIS — J029 Acute pharyngitis, unspecified: Secondary | ICD-10-CM | POA: Diagnosis not present

## 2017-07-18 MED ORDER — BENZONATATE 100 MG PO CAPS: 100.0000 mg | ORAL_CAPSULE | Freq: Three times a day (TID) | ORAL | 0 refills | Status: DC | PRN

## 2017-07-18 MED ORDER — ALBUTEROL SULFATE HFA 108 (90 BASE) MCG/ACT IN AERS: 2.0000 | INHALATION_SPRAY | RESPIRATORY_TRACT | 2 refills | Status: DC | PRN

## 2017-07-18 MED ORDER — PREDNISONE 5 MG PO TABS: 5.0000 mg | ORAL_TABLET | ORAL | 0 refills | Status: DC

## 2017-07-18 NOTE — Progress Notes (Signed)
Thank you for the details you included in the comment boxes. Those details are very helpful in determining the best course of treatment for you and help us to provide the best care.  We are sorry that you are not feeling well.  Here is how we plan to help!  Based on your presentation I believe you most likely have A cough due to a virus.  This is called viral bronchitis and is best treated by rest, plenty of fluids and control of the cough.  You may use Ibuprofen or Tylenol as directed to help your symptoms.     In addition you may use A non-prescription cough medication called Mucinex DM: take 2 tablets every 12 hours. and A prescription cough medication called Tessalon Perles 100mg . You may take 1-2 capsules every 8 hours as needed for your cough.  Sterapred 5 mg dosepak  I have also prescribed an albuterol inhaler HFA , take 2 puffs every 4 hours as needed for shortness of breath.   From your responses in the eVisit questionnaire you describe inflammation in the upper respiratory tract which is causing a significant cough.  This is commonly called Bronchitis and has four common causes:    Allergies  Viral Infections  Acid Reflux  Bacterial Infection Allergies, viruses and acid reflux are treated by controlling symptoms or eliminating the cause. An example might be a cough caused by taking certain blood pressure medications. You stop the cough by changing the medication. Another example might be a cough caused by acid reflux. Controlling the reflux helps control the cough.  USE OF BRONCHODILATOR ("RESCUE") INHALERS: There is a risk from using your bronchodilator too frequently.  The risk is that over-reliance on a medication which only relaxes the muscles surrounding the breathing tubes can reduce the effectiveness of medications prescribed to reduce swelling and congestion of the tubes themselves.  Although you feel brief relief from the bronchodilator inhaler, your asthma may actually be  worsening with the tubes becoming more swollen and filled with mucus.  This can delay other crucial treatments, such as oral steroid medications. If you need to use a bronchodilator inhaler daily, several times per day, you should discuss this with your provider.  There are probably better treatments that could be used to keep your asthma under control.     HOME CARE . Only take medications as instructed by your medical team. . Complete the entire course of an antibiotic. . Drink plenty of fluids and get plenty of rest. . Avoid close contacts especially the very young and the elderly . Cover your mouth if you cough or cough into your sleeve. . Always remember to wash your hands . A steam or ultrasonic humidifier can help congestion.   GET HELP RIGHT AWAY IF: . You develop worsening fever. . You become short of breath . You cough up blood. . Your symptoms persist after you have completed your treatment plan MAKE SURE YOU   Understand these instructions.  Will watch your condition.  Will get help right away if you are not doing well or get worse.  Your e-visit answers were reviewed by a board certified advanced clinical practitioner to complete your personal care plan.  Depending on the condition, your plan could have included both over the counter or prescription medications. If there is a problem please reply  once you have received a response from your provider. Your safety is important to us.  If you have drug allergies check your prescription carefully.  You can use MyChart to ask questions about today's visit, request a non-urgent call back, or ask for a work or school excuse for 24 hours related to this e-Visit. If it has been greater than 24 hours you will need to follow up with your provider, or enter a new e-Visit to address those concerns. You will get an e-mail in the next two days asking about your experience.  I hope that your e-visit has been valuable and will speed your  recovery. Thank you for using e-visits.

## 2017-07-19 MED FILL — VENTOLIN HFA 90 MCG INHALER: 108 (90 BAS | 30 days supply | Qty: 18 | Fill #0

## 2017-07-19 MED FILL — BENZONATATE 100 MG CAPSULE: 100 | 10 days supply | Qty: 30 | Fill #0

## 2017-07-19 MED FILL — predniSONE 5 MG TABS: 5 | 6 days supply | Qty: 21 | Fill #0

## 2017-07-29 MED FILL — busPIRone HCL 7.5 MG TABS: 7.5 | 30 days supply | Qty: 60 | Fill #1

## 2017-08-05 ENCOUNTER — Other Ambulatory Visit: Payer: Self-pay | Admitting: Family Medicine

## 2017-08-05 DIAGNOSIS — F32A Depression, unspecified: Secondary | ICD-10-CM

## 2017-08-05 DIAGNOSIS — F329 Major depressive disorder, single episode, unspecified: Secondary | ICD-10-CM

## 2017-08-05 DIAGNOSIS — F419 Anxiety disorder, unspecified: Principal | ICD-10-CM

## 2017-08-05 MED FILL — FARXIGA 5 MG TABLET: 5 | 30 days supply | Qty: 30 | Fill #1

## 2017-08-07 MED FILL — FLUoxetine HCL 40 MG CAPS: 40 | 30 days supply | Qty: 30 | Fill #0

## 2017-08-19 ENCOUNTER — Telehealth: Payer: 59 | Admitting: Family

## 2017-08-19 DIAGNOSIS — M545 Low back pain: Secondary | ICD-10-CM

## 2017-08-19 MED ORDER — BACLOFEN 10 MG PO TABS: 10.0000 mg | ORAL_TABLET | Freq: Three times a day (TID) | ORAL | 0 refills | Status: DC

## 2017-08-19 MED ORDER — NAPROXEN 500 MG PO TABS: 500.0000 mg | ORAL_TABLET | Freq: Two times a day (BID) | ORAL | 0 refills | Status: DC

## 2017-08-19 MED FILL — NAPROXEN 500 MG TABLET: 500 | 15 days supply | Qty: 30 | Fill #0

## 2017-08-19 MED FILL — BACLOFEN 10 MG TABS: 10 | 10 days supply | Qty: 30 | Fill #0

## 2017-08-19 NOTE — Progress Notes (Signed)

## 2017-08-26 MED FILL — METFORMIN HCL ER 500 MG TAB: 500 | 90 days supply | Qty: 180 | Fill #1

## 2017-08-29 ENCOUNTER — Telehealth: Payer: 59 | Admitting: Physician Assistant

## 2017-08-29 DIAGNOSIS — N3000 Acute cystitis without hematuria: Secondary | ICD-10-CM

## 2017-08-29 MED ORDER — CEPHALEXIN 500 MG PO CAPS: 500.0000 mg | ORAL_CAPSULE | Freq: Two times a day (BID) | ORAL | 0 refills | Status: AC

## 2017-08-29 MED FILL — CEPHALEXIN 500 MG CAPSULE: 500 | 7 days supply | Qty: 14 | Fill #0

## 2017-08-29 NOTE — Progress Notes (Signed)

## 2017-09-16 MED FILL — hydrOXYzine HCL 50 MG TABS: 50 | 30 days supply | Qty: 90 | Fill #0

## 2017-09-16 MED FILL — FLUoxetine HCL 40 MG CAPS: 40 | 30 days supply | Qty: 30 | Fill #1

## 2017-09-16 MED FILL — JANUVIA 100 MG TABLET: 100 | 90 days supply | Qty: 90 | Fill #1

## 2017-09-16 MED FILL — busPIRone HCL 7.5 MG TABS: 7.5 | 30 days supply | Qty: 60 | Fill #2

## 2017-09-16 MED FILL — FARXIGA 5 MG TABLET: 5 | 30 days supply | Qty: 30 | Fill #2

## 2017-09-23 ENCOUNTER — Ambulatory Visit: Payer: 59 | Admitting: Women's Health

## 2017-10-16 MED FILL — FARXIGA 5 MG TABLET: 5 | 30 days supply | Qty: 30 | Fill #3

## 2017-10-16 MED FILL — FLUoxetine HCL 40 MG CAPS: 40 | 30 days supply | Qty: 30 | Fill #2

## 2017-11-11 MED FILL — METFORMIN HCL ER 500 MG TAB: 500 | 90 days supply | Qty: 180 | Fill #0

## 2017-11-11 MED FILL — FARXIGA 5 MG TABLET: 5 | 30 days supply | Qty: 30 | Fill #4

## 2017-11-11 MED FILL — busPIRone HCL 7.5 MG TABS: 7.5 | 30 days supply | Qty: 60 | Fill #3

## 2017-11-11 MED FILL — FLUoxetine HCL 40 MG CAPS: 40 | 30 days supply | Qty: 30 | Fill #3

## 2017-11-12 ENCOUNTER — Other Ambulatory Visit: Payer: Self-pay | Admitting: Family

## 2017-11-12 DIAGNOSIS — M545 Low back pain: Secondary | ICD-10-CM

## 2017-12-16 ENCOUNTER — Other Ambulatory Visit: Payer: Self-pay | Admitting: Family Medicine

## 2017-12-16 DIAGNOSIS — F329 Major depressive disorder, single episode, unspecified: Secondary | ICD-10-CM

## 2017-12-16 DIAGNOSIS — F419 Anxiety disorder, unspecified: Principal | ICD-10-CM

## 2017-12-16 DIAGNOSIS — F32A Depression, unspecified: Secondary | ICD-10-CM

## 2017-12-16 MED FILL — JANUVIA 100 MG TABLET: 100 | 90 days supply | Qty: 90 | Fill #0

## 2017-12-16 MED FILL — FLUoxetine HCL 40 MG CAPS: 40 | 30 days supply | Qty: 30 | Fill #0

## 2017-12-16 MED FILL — FARXIGA 5 MG TABLET: 5 | 30 days supply | Qty: 30 | Fill #5

## 2018-01-01 ENCOUNTER — Encounter: Payer: Self-pay | Admitting: Internal Medicine

## 2018-01-01 DIAGNOSIS — E119 Type 2 diabetes mellitus without complications: Secondary | ICD-10-CM | POA: Diagnosis not present

## 2018-01-01 LAB — HM DIABETES EYE EXAM

## 2018-01-03 ENCOUNTER — Encounter: Payer: Self-pay | Admitting: Family Medicine

## 2018-01-10 ENCOUNTER — Other Ambulatory Visit: Payer: Self-pay | Admitting: Family Medicine

## 2018-01-10 DIAGNOSIS — E669 Obesity, unspecified: Secondary | ICD-10-CM

## 2018-01-10 DIAGNOSIS — F419 Anxiety disorder, unspecified: Secondary | ICD-10-CM

## 2018-01-10 DIAGNOSIS — F329 Major depressive disorder, single episode, unspecified: Secondary | ICD-10-CM

## 2018-01-10 DIAGNOSIS — E1169 Type 2 diabetes mellitus with other specified complication: Secondary | ICD-10-CM

## 2018-01-10 DIAGNOSIS — F32A Depression, unspecified: Secondary | ICD-10-CM

## 2018-01-10 DIAGNOSIS — T300 Burn of unspecified body region, unspecified degree: Secondary | ICD-10-CM

## 2018-01-10 MED FILL — FARXIGA 5 MG TABLET: 5 | 30 days supply | Qty: 30 | Fill #0

## 2018-01-10 MED FILL — busPIRone HCL 7.5 MG TABS: 7.5 | 90 days supply | Qty: 180 | Fill #0

## 2018-01-10 MED FILL — SSD 1% CREAM: 1 | 30 days supply | Qty: 50 | Fill #0

## 2018-01-10 MED FILL — hydrOXYzine HCL 50 MG TABS: 50 | 30 days supply | Qty: 90 | Fill #1

## 2018-01-10 MED FILL — FLUoxetine HCL 40 MG CAPS: 40 | 30 days supply | Qty: 30 | Fill #1

## 2018-02-10 ENCOUNTER — Ambulatory Visit (INDEPENDENT_AMBULATORY_CARE_PROVIDER_SITE_OTHER): Payer: 59 | Admitting: Family Medicine

## 2018-02-10 ENCOUNTER — Encounter: Payer: Self-pay | Admitting: Family Medicine

## 2018-02-10 VITALS — BP 120/86 | HR 78 | Temp 98.2°F | Ht 61.0 in | Wt 246.2 lb

## 2018-02-10 DIAGNOSIS — Z23 Encounter for immunization: Secondary | ICD-10-CM

## 2018-02-10 DIAGNOSIS — E1169 Type 2 diabetes mellitus with other specified complication: Secondary | ICD-10-CM

## 2018-02-10 DIAGNOSIS — E669 Obesity, unspecified: Secondary | ICD-10-CM | POA: Diagnosis not present

## 2018-02-10 DIAGNOSIS — Z0000 Encounter for general adult medical examination without abnormal findings: Secondary | ICD-10-CM | POA: Diagnosis not present

## 2018-02-10 LAB — LIPID PANEL
CHOLESTEROL: 194 mg/dL (ref 0–200)
HDL: 42 mg/dL (ref >39.00)
LDL Cholesterol: 132 mg/dL — ABNORMAL HIGH (ref 0–99)
NONHDL: 152.01
TRIGLYCERIDES: 98 mg/dL (ref 0.0–149.0)
Total CHOL/HDL Ratio: 5
VLDL: 19.6 mg/dL (ref 0.0–40.0)

## 2018-02-10 LAB — HEMOGLOBIN A1C: Hgb A1c MFr Bld: 7 % — ABNORMAL HIGH (ref 4.6–6.5)

## 2018-02-10 LAB — CBC
HEMATOCRIT: 40.2 % (ref 36.0–46.0)
HEMOGLOBIN: 13 g/dL (ref 12.0–15.0)
MCHC: 32.4 g/dL (ref 30.0–36.0)
MCV: 77.8 fl — ABNORMAL LOW (ref 78.0–100.0)
PLATELETS: 412 10*3/uL — AB (ref 150.0–400.0)
RBC: 5.16 Mil/uL — ABNORMAL HIGH (ref 3.87–5.11)
RDW: 16.6 % — ABNORMAL HIGH (ref 11.5–15.5)
WBC: 10.7 10*3/uL — ABNORMAL HIGH (ref 4.0–10.5)

## 2018-02-10 LAB — MICROALBUMIN / CREATININE URINE RATIO
CREATININE, U: 115.1 mg/dL
MICROALB UR: 1.2 mg/dL (ref 0.0–1.9)
MICROALB/CREAT RATIO: 1 mg/g (ref 0.0–30.0)

## 2018-02-10 LAB — COMPREHENSIVE METABOLIC PANEL
ALBUMIN: 3.9 g/dL (ref 3.5–5.2)
ALK PHOS: 49 U/L (ref 39–117)
ALT: 72 U/L — ABNORMAL HIGH (ref 0–35)
AST: 33 U/L (ref 0–37)
BUN: 10 mg/dL (ref 6–23)
CALCIUM: 9.3 mg/dL (ref 8.4–10.5)
CO2: 26 mEq/L (ref 19–32)
Chloride: 102 mEq/L (ref 96–112)
Creatinine, Ser: 0.58 mg/dL (ref 0.40–1.20)
GFR: 123.76 mL/min (ref >60.00)
GLUCOSE: 139 mg/dL — AB (ref 70–99)
POTASSIUM: 4.2 meq/L (ref 3.5–5.1)
Sodium: 138 mEq/L (ref 135–145)
TOTAL PROTEIN: 6.6 g/dL (ref 6.0–8.3)
Total Bilirubin: 0.3 mg/dL (ref 0.2–1.2)

## 2018-02-10 MED ORDER — CLONAZEPAM 0.5 MG PO TABS: 0.5000 mg | ORAL_TABLET | Freq: Two times a day (BID) | ORAL | 1 refills | Status: DC | PRN

## 2018-02-10 MED FILL — clonazePAM 0.5 MG TABS: 0.5 | 15 days supply | Qty: 30 | Fill #0

## 2018-02-10 MED FILL — FLUoxetine HCL 40 MG CAPS: 40 | 30 days supply | Qty: 30 | Fill #2

## 2018-02-10 MED FILL — FARXIGA 5 MG TABLET: 5 | 30 days supply | Qty: 30 | Fill #1

## 2018-02-10 MED FILL — METFORMIN HCL ER 500 MG TAB: 500 | 90 days supply | Qty: 180 | Fill #1

## 2018-02-10 NOTE — Progress Notes (Signed)
Chief Complaint  Patient presents with  . Follow-up     Well Woman Martha MaduroJessica Shannon is here for a complete physical.   Her last physical was >1 year ago.  Current diet: in general, a "healthy" diet. Contraception? Yes No LMP recorded. (Menstrual status: Other). Seatbelt? Yes  Health Maintenance Pap/HPV- Yes Tetanus- No HIV screening- Yes   A&D Depression doing well. Anxiety/irritability bothersome. Compliant with BuSpar 7.5 mg bid and Prozac 40 mg/d. Hydroxyzine not particularly helpful. Interested in both counseling and psych. Is having less stress in life.   DM Compliant w meds. Sugars are low-mid 100's. Reports diet is OK.   Past Medical History:  Diagnosis Date  . Anxiety and depression 09/24/2016  . Asthma   . Chicken pox    As a child.  . Depression   . Diabetes mellitus type 2 in obese (HCC) 09/24/2016  . Fatty liver   . Kidney stones   . Migraines      Past Surgical History:  Procedure Laterality Date  . CESAREAN SECTION     2006.    Medications  Current Outpatient Medications on File Prior to Visit  Medication Sig Dispense Refill  . ACCU-CHEK SOFTCLIX LANCETS lancets Use as directed once daily to check blood sugar.  E11.9 100 each 6  . albuterol (PROVENTIL HFA;VENTOLIN HFA) 108 (90 Base) MCG/ACT inhaler Inhale 2 puffs into the lungs every 4 (four) hours as needed for wheezing or shortness of breath. 1 Inhaler 2  . baclofen (LIORESAL) 10 MG tablet Take 1 tablet (10 mg total) by mouth 3 (three) times daily. 30 each 0  . busPIRone (BUSPAR) 7.5 MG tablet TAKE 1 TABLET (7.5 MG TOTAL) BY MOUTH 2 (TWO) TIMES DAILY. 180 tablet 1  . Cyanocobalamin (VITAMIN B 12 PO) Take by mouth.    Marland Kitchen. FARXIGA 5 MG TABS tablet TAKE 1 TABLET BY MOUTH DAILY 30 tablet 5  . FLUoxetine (PROZAC) 40 MG capsule TAKE 1 CAPSULE (40 MG TOTAL) BY MOUTH DAILY. 30 capsule 3  . glucose blood (ACCU-CHEK AVIVA) test strip Use as directed once daily to check blood sugar.  E11.9 100 each 6  .  metFORMIN (GLUCOPHAGE-XR) 500 MG 24 hr tablet Take 2 tablets (1,000 mg total) by mouth at bedtime. 180 tablet 1  . sitaGLIPtin (JANUVIA) 100 MG tablet Take 1 tablet (100 mg total) by mouth daily. 90 tablet 1  . SSD 1 % cream APPLY 1 APPLICATION TOPICALLY DAILY. 50 g 1    Allergies Allergies  Allergen Reactions  . Naproxen     Legs felt agitated    Review of Systems: Constitutional:  no unexpected weight changes Eye:  no recent significant change in vision Ear/Nose/Mouth/Throat:  Ears:  no tinnitus or vertigo and no recent change in hearing Nose/Mouth/Throat:  no complaints of nasal congestion, no sore throat Cardiovascular: no chest pain Respiratory:  no cough and no shortness of breath Gastrointestinal:  no abdominal pain, no change in bowel habits GU:  Female: negative for dysuria or pelvic pain Musculoskeletal/Extremities:  no pain of the joints Integumentary (Skin/Breast):  no abnormal skin lesions reported Neurologic:  no headaches Endocrine:  denies fatigue Hematologic/Lymphatic:  No areas of easy bleeding  Exam BP 120/86 (BP Location: Left Arm, Patient Position: Sitting, Cuff Size: Large)   Pulse 78   Temp 98.2 F (36.8 C) (Oral)   Ht 5\' 1"  (1.549 m)   Wt 246 lb 4 oz (111.7 kg)   SpO2 98%   BMI 46.53 kg/m  General:  well developed, well nourished, in no apparent distress Skin:  no significant moles, warts, or growths Head:  no masses, lesions, or tenderness Eyes:  pupils equal and round, sclera anicteric without injection Ears:  canals without lesions, TMs shiny without retraction, no obvious effusion, no erythema Nose:  nares patent, septum midline, mucosa normal, and no drainage or sinus tenderness Throat/Pharynx:  lips and gingiva without lesion; tongue and uvula midline; non-inflamed pharynx; no exudates or postnasal drainage Neck: neck supple without adenopathy, thyromegaly, or masses Lungs:  clear to auscultation, breath sounds equal bilaterally, no  respiratory distress Cardio:  regular rate and rhythm, no bruits, no LE edema Abdomen:  abdomen soft, nontender; bowel sounds normal; no masses or organomegaly Genital: Defer to GYN Musculoskeletal:  symmetrical muscle groups noted without atrophy or deformity Extremities:  no clubbing, cyanosis, or edema, no deformities, no skin discoloration Neuro:  gait normal; no cerebellar signs Psych: well oriented with normal range of affect and appropriate judgment/insight  Assessment and Plan  Well adult exam - Plan: CBC, Comprehensive metabolic panel, Lipid panel  Diabetes mellitus type 2 in obese (HCC) - Plan: Hemoglobin A1c, Microalbumin / creatinine urine ratio  Need for tetanus booster - Plan: Tdap vaccine greater than or equal to 7yo IM  Need for vaccination against Streptococcus pneumoniae - Plan: Pneumococcal polysaccharide vaccine 23-valent greater than or equal to 2yo subcutaneous/IM   Well 37 y.o. female. Counseled on diet and exercise. Other orders as above. Update imms.  Contact info for counseling and psych provided at her request. Will stop Atarax, start Klonopin. If working well, will have her sign csc. Follow up in 6 weeks to reck anxiety. The patient voiced understanding and agreement to the plan.  Martha Roche [place], DO 02/10/18 8:12 AM

## 2018-02-10 NOTE — Progress Notes (Signed)
Pre visit review using our clinic review tool, if applicable. No additional management support is needed unless otherwise documented below in the visit note. 

## 2018-02-10 NOTE — Patient Instructions (Addendum)
Please consider counseling. Contact [phone] to schedule an appointment or inquire about cost/insurance coverage.  [facility] [address] [place], [place] [address] [phone]  [facility] [address] [place], [place] [address] [phone]  [facility] [address] [place], [place] [address] [phone]  [facility] [address], [place], [place], #[phone] [address], [place], [place], #[phone]  [facility] [address], [place] [address] [phone]  [facility] [address] [place], [place] [phone]  [facility] [address] [place], [place] [phone]  Call one of these offices sooner than later as it can take 2-3 months to get a new patient appointment.   Coping skills Choose 5 that work for you:  Take a deep breath  Count to 20  Read a book  Do a puzzle  Meditate  Bake  Sing  Knit  Garden  Pray  Go outside  Call a friend  Listen to music  Take a walk  Color  Send a note  Take a bath  Watch a movie  Be alone in a quiet place  Pet an animal  Visit a friend  Journal  Exercise  Stretch

## 2018-02-12 ENCOUNTER — Encounter: Payer: Self-pay | Admitting: Family Medicine

## 2018-02-26 ENCOUNTER — Encounter: Payer: Self-pay | Admitting: Family Medicine

## 2018-02-26 ENCOUNTER — Other Ambulatory Visit: Payer: Self-pay | Admitting: Family Medicine

## 2018-02-26 MED ORDER — SCOPOLAMINE 1 MG/3DAYS TD PT72: 1.0000 | MEDICATED_PATCH | TRANSDERMAL | 0 refills | Status: DC

## 2018-03-04 MED FILL — JANUVIA 100 MG TABLET: 100 | 90 days supply | Qty: 90 | Fill #1

## 2018-03-12 MED FILL — FARXIGA 5 MG TABLET: 5 | 30 days supply | Qty: 30 | Fill #2

## 2018-03-12 MED FILL — clonazePAM 0.5 MG TABS: 0.5 | 15 days supply | Qty: 30 | Fill #1

## 2018-03-12 MED FILL — FLUoxetine HCL 40 MG CAPS: 40 | 30 days supply | Qty: 30 | Fill #3

## 2018-03-18 ENCOUNTER — Encounter: Payer: Self-pay | Admitting: Family Medicine

## 2018-03-24 ENCOUNTER — Ambulatory Visit: Payer: 59 | Admitting: Family Medicine

## 2018-03-28 ENCOUNTER — Encounter: Payer: Self-pay | Admitting: Family Medicine

## 2018-03-28 ENCOUNTER — Ambulatory Visit (INDEPENDENT_AMBULATORY_CARE_PROVIDER_SITE_OTHER): Payer: 59 | Admitting: Family Medicine

## 2018-03-28 VITALS — BP 114/80 | HR 80 | Temp 98.9°F | Ht 61.0 in | Wt 245.0 lb

## 2018-03-28 DIAGNOSIS — F329 Major depressive disorder, single episode, unspecified: Secondary | ICD-10-CM | POA: Diagnosis not present

## 2018-03-28 DIAGNOSIS — F419 Anxiety disorder, unspecified: Secondary | ICD-10-CM | POA: Diagnosis not present

## 2018-03-28 DIAGNOSIS — F32A Depression, unspecified: Secondary | ICD-10-CM

## 2018-03-28 MED ORDER — SERTRALINE HCL 100 MG PO TABS: 100.0000 mg | ORAL_TABLET | Freq: Every day | ORAL | 3 refills | Status: DC

## 2018-03-28 MED FILL — SERTRALINE HCL 100 MG TAB: 100 | 30 days supply | Qty: 30 | Fill #0

## 2018-03-28 NOTE — Patient Instructions (Addendum)
I am happy you are looking into counseling.   Let me know when you need refills.  If you do not hear anything about your referral in the next 1-2 weeks, call our office and ask for an update.  Let us know if you need anything.

## 2018-03-28 NOTE — Progress Notes (Signed)
Pre visit review using our clinic review tool, if applicable. No additional management support is needed unless otherwise documented below in the visit note. 

## 2018-03-28 NOTE — Progress Notes (Signed)
Chief Complaint  Patient presents with  . Anxiety    Subjective Martha Shannon presents for f/u anxiety/depression.  She is currently being treated with Prozac 40 mg/d, BuSpar 7.5 mg bid, Klonipin 0.5 mg bid prn.  Reports worsening since treatment.  AE's w Klonopin where she got cranky/tired. Work is stressful. Sister's situation also stressful.  No thoughts of harming self or others. No self-medication with alcohol, prescription drugs or illicit drugs. Pt is not following with a counselor/psychologist.  ROS Psych: No homicidal or suicidal thoughts  Past Medical History:  Diagnosis Date  . Anxiety and depression 09/24/2016  . Asthma   . Chicken pox    As a child.  . Depression   . Diabetes mellitus type 2 in obese (HCC) 09/24/2016  . Fatty liver   . Kidney stones   . Migraines    Exam BP 114/80 (BP Location: Left Arm, Patient Position: Sitting, Cuff Size: Large)   Pulse 80   Temp 98.9 F (37.2 C) (Oral)   Ht 5\' 1"  (1.549 m)   Wt 245 lb (111.1 kg)   SpO2 97%   BMI 46.29 kg/m  General:  well developed, well nourished, in no apparent distress Lungs:  no respiratory distress Psych: well oriented with normal range of affect and age-appropriate judgement/insight, alert and oriented x4.  Assessment and Plan  Anxiety and depression - Plan: sertraline (ZOLOFT) 100 MG tablet  Morbid obesity (HCC) - Plan: Amb Ref to Medical Weight Management  Orders as above. Change Prozac to Zoloft. Has actually failed Lexapro. [place].  Refer to [facility], she is also considering bariatric surgery. Counseled on diet and exercise.  F/u in 6 weeks. The patient voiced understanding and agreement to the plan.  Jilda Rocheicholas [place], DO 03/28/18 8:36 AM

## 2018-04-03 ENCOUNTER — Encounter: Payer: Self-pay | Admitting: Family Medicine

## 2018-04-03 MED ORDER — CLONAZEPAM 0.5 MG PO TABS: 0.2500 mg | ORAL_TABLET | Freq: Two times a day (BID) | ORAL | 1 refills | Status: DC | PRN

## 2018-04-03 MED FILL — clonazePAM 0.5 MG TABS: 0.5 | 30 days supply | Qty: 30 | Fill #0

## 2018-04-15 MED FILL — FARXIGA 5 MG TABLET: 5 | 30 days supply | Qty: 30 | Fill #3

## 2018-04-15 MED FILL — SSD 1% CREAM: 1 | 30 days supply | Qty: 50 | Fill #1

## 2018-04-30 ENCOUNTER — Other Ambulatory Visit: Payer: Self-pay | Admitting: Family Medicine

## 2018-04-30 MED FILL — ACCU-CHEK GUIDE TEST STRIP: 90 days supply | Qty: 100 | Fill #0

## 2018-04-30 MED FILL — ACCU-CHEK FASTCLIX LANCETS: 90 days supply | Qty: 102 | Fill #0

## 2018-05-05 ENCOUNTER — Other Ambulatory Visit: Payer: Self-pay | Admitting: Family Medicine

## 2018-05-05 DIAGNOSIS — E669 Obesity, unspecified: Principal | ICD-10-CM

## 2018-05-05 DIAGNOSIS — E1169 Type 2 diabetes mellitus with other specified complication: Secondary | ICD-10-CM

## 2018-05-05 MED FILL — clonazePAM 0.5 MG TABS: 0.5 | 30 days supply | Qty: 30 | Fill #1

## 2018-05-05 MED FILL — SERTRALINE HCL 100 MG TAB: 100 | 30 days supply | Qty: 30 | Fill #1

## 2018-05-06 MED FILL — METFORMIN HCL ER 500 MG TAB: 500 | 90 days supply | Qty: 180 | Fill #0

## 2018-05-12 ENCOUNTER — Other Ambulatory Visit: Payer: Self-pay | Admitting: Family Medicine

## 2018-05-12 DIAGNOSIS — E1169 Type 2 diabetes mellitus with other specified complication: Secondary | ICD-10-CM

## 2018-05-12 DIAGNOSIS — F32A Depression, unspecified: Secondary | ICD-10-CM

## 2018-05-12 DIAGNOSIS — F419 Anxiety disorder, unspecified: Secondary | ICD-10-CM

## 2018-05-12 DIAGNOSIS — F329 Major depressive disorder, single episode, unspecified: Secondary | ICD-10-CM

## 2018-05-12 DIAGNOSIS — E669 Obesity, unspecified: Principal | ICD-10-CM

## 2018-05-12 MED FILL — FARXIGA 5 MG TABLET: 5 | 30 days supply | Qty: 30 | Fill #4

## 2018-05-12 MED FILL — busPIRone HCL 7.5 MG TABS: 7.5 | 90 days supply | Qty: 180 | Fill #1

## 2018-05-13 MED FILL — hydrOXYzine HCL 50 MG TABS: 50 | 30 days supply | Qty: 90 | Fill #0

## 2018-05-28 ENCOUNTER — Ambulatory Visit: Payer: 59 | Admitting: Family Medicine

## 2018-05-29 ENCOUNTER — Encounter (INDEPENDENT_AMBULATORY_CARE_PROVIDER_SITE_OTHER): Payer: 59

## 2018-06-04 ENCOUNTER — Encounter (INDEPENDENT_AMBULATORY_CARE_PROVIDER_SITE_OTHER): Payer: Self-pay

## 2018-06-04 ENCOUNTER — Ambulatory Visit (INDEPENDENT_AMBULATORY_CARE_PROVIDER_SITE_OTHER): Payer: 59 | Admitting: Family Medicine

## 2018-06-11 MED FILL — SERTRALINE HCL 100 MG TAB: 100 | 30 days supply | Qty: 30 | Fill #2

## 2018-06-11 MED FILL — FARXIGA 5 MG TABLET: 5 | 30 days supply | Qty: 30 | Fill #5

## 2018-06-11 MED FILL — JANUVIA 100 MG TABLET: 100 | 90 days supply | Qty: 90 | Fill #0

## 2018-06-24 ENCOUNTER — Encounter: Payer: Self-pay | Admitting: Family Medicine

## 2018-07-09 ENCOUNTER — Other Ambulatory Visit: Payer: Self-pay | Admitting: Family Medicine

## 2018-07-09 DIAGNOSIS — E669 Obesity, unspecified: Principal | ICD-10-CM

## 2018-07-09 DIAGNOSIS — E1169 Type 2 diabetes mellitus with other specified complication: Secondary | ICD-10-CM

## 2018-07-09 MED FILL — FARXIGA 5 MG TABLET: 5 | 30 days supply | Qty: 30 | Fill #0

## 2018-07-09 MED FILL — hydrOXYzine HCL 50 MG TABS: 50 | 30 days supply | Qty: 90 | Fill #1

## 2018-07-09 MED FILL — SERTRALINE HCL 100 MG TAB: 100 | 30 days supply | Qty: 30 | Fill #3

## 2018-07-09 NOTE — Telephone Encounter (Signed)
Last office visit on 03/28/2018 Last refill on 04/03/2018   #30 with 1 refill

## 2018-07-10 MED FILL — clonazePAM 0.5 MG TABS: 0.5 | 30 days supply | Qty: 30 | Fill #0

## 2018-07-10 NOTE — Telephone Encounter (Signed)
Pt. Returned call, and opted to make appointment in the new year. OV made for 1/2 with PCP. Pt. Made aware of clonazepam rx refill sent and appreciative.

## 2018-07-10 NOTE — Telephone Encounter (Signed)
Called left message to call back 

## 2018-07-10 NOTE — Telephone Encounter (Signed)
I did want to see her a little sooner for follow up on this. Please schedule. If cost is an issue, we can wait until new year. Will refill for now. TY.

## 2018-07-11 DIAGNOSIS — Z0279 Encounter for issue of other medical certificate: Secondary | ICD-10-CM

## 2018-08-07 ENCOUNTER — Other Ambulatory Visit: Payer: Self-pay | Admitting: Family Medicine

## 2018-08-07 DIAGNOSIS — E1169 Type 2 diabetes mellitus with other specified complication: Secondary | ICD-10-CM

## 2018-08-07 DIAGNOSIS — E669 Obesity, unspecified: Principal | ICD-10-CM

## 2018-08-08 ENCOUNTER — Other Ambulatory Visit: Payer: Self-pay | Admitting: Family Medicine

## 2018-08-08 DIAGNOSIS — F329 Major depressive disorder, single episode, unspecified: Secondary | ICD-10-CM

## 2018-08-08 DIAGNOSIS — E669 Obesity, unspecified: Secondary | ICD-10-CM

## 2018-08-08 DIAGNOSIS — F419 Anxiety disorder, unspecified: Principal | ICD-10-CM

## 2018-08-08 DIAGNOSIS — E1169 Type 2 diabetes mellitus with other specified complication: Secondary | ICD-10-CM

## 2018-08-08 DIAGNOSIS — F32A Depression, unspecified: Secondary | ICD-10-CM

## 2018-08-08 MED ORDER — METFORMIN HCL ER 500 MG PO TB24: 1000.0000 mg | ORAL_TABLET | Freq: Every day | ORAL | 0 refills | Status: DC

## 2018-08-08 MED FILL — SERTRALINE HCL 100 MG TAB: 100 | 30 days supply | Qty: 30 | Fill #0

## 2018-08-08 MED FILL — FARXIGA 5 MG TABLET: 5 | 30 days supply | Qty: 30 | Fill #1

## 2018-08-08 MED FILL — metFORMIN HCL ER 500 MG TB2: 500 | 90 days supply | Qty: 180 | Fill #0

## 2018-08-28 ENCOUNTER — Encounter: Payer: Self-pay | Admitting: Family Medicine

## 2018-08-28 ENCOUNTER — Ambulatory Visit (INDEPENDENT_AMBULATORY_CARE_PROVIDER_SITE_OTHER): Payer: 59 | Admitting: Family Medicine

## 2018-08-28 VITALS — BP 122/82 | HR 101 | Temp 98.7°F | Ht 61.0 in | Wt 247.0 lb

## 2018-08-28 DIAGNOSIS — R109 Unspecified abdominal pain: Secondary | ICD-10-CM | POA: Diagnosis not present

## 2018-08-28 DIAGNOSIS — F419 Anxiety disorder, unspecified: Secondary | ICD-10-CM

## 2018-08-28 DIAGNOSIS — E1169 Type 2 diabetes mellitus with other specified complication: Secondary | ICD-10-CM | POA: Diagnosis not present

## 2018-08-28 DIAGNOSIS — F329 Major depressive disorder, single episode, unspecified: Secondary | ICD-10-CM

## 2018-08-28 DIAGNOSIS — F32A Depression, unspecified: Secondary | ICD-10-CM

## 2018-08-28 DIAGNOSIS — E669 Obesity, unspecified: Secondary | ICD-10-CM

## 2018-08-28 LAB — POC URINALSYSI DIPSTICK (AUTOMATED)
Bilirubin, UA: NEGATIVE
Blood, UA: NEGATIVE
Glucose, UA: NEGATIVE
Ketones, UA: NEGATIVE
Leukocytes, UA: NEGATIVE
Nitrite, UA: NEGATIVE
Protein, UA: POSITIVE — AB
Spec Grav, UA: 1.025 (ref 1.010–1.025)
Urobilinogen, UA: NEGATIVE E.U./dL — AB
pH, UA: 6 (ref 5.0–8.0)

## 2018-08-28 LAB — URINALYSIS
Bilirubin Urine: NEGATIVE
Hgb urine dipstick: NEGATIVE
Leukocytes, UA: NEGATIVE
Nitrite: NEGATIVE
SPECIFIC GRAVITY, URINE: 1.02 (ref 1.000–1.030)
Total Protein, Urine: NEGATIVE
UROBILINOGEN UA: 0.2 (ref 0.0–1.0)
pH: 6 (ref 5.0–8.0)

## 2018-08-28 LAB — HEMOGLOBIN A1C: Hgb A1c MFr Bld: 7.4 % — ABNORMAL HIGH (ref 4.6–6.5)

## 2018-08-28 NOTE — Progress Notes (Signed)
Chief Complaint  Patient presents with  . Follow-up    Subjective Martha Shannon presents for f/u anxiety/depression.   She is currently being treated with Zoloft 100 mg/d.  Reports good improvement since treatment. No thoughts of harming self or others. No self-medication with alcohol, prescription drugs or illicit drugs. Toxic environment at work, caused by single worker, has resolved and things have gotten a little better. Pt is not following with a counselor/psychologist.   DM Currently on Metformin, Farxiga and Januvia. Compliant, no AE's. Diet has been poor with holidays, walking. Sugars are mid-high 100's and low 200's on average. UTD with imms.   ROS Psych: No homicidal or suicidal thoughts   Past Medical History:  Diagnosis Date  . Anxiety and depression 09/24/2016  . Asthma   . Chicken pox    As a child.  . Depression   . Diabetes mellitus type 2 in obese (HCC) 09/24/2016  . Fatty liver   . Kidney stones   . Migraines      Exam BP 122/82 (BP Location: Left Arm, Patient Position: Sitting, Cuff Size: Large)   Pulse (!) 101   Temp 98.7 F (37.1 C) (Oral)   Ht 5\' 1"  (1.549 m)   Wt 247 lb (112 kg)   SpO2 97%   BMI 46.67 kg/m  General:  well developed, well nourished, in no apparent distress Skin: No lesions on feet Neuro: Sensation intact to pinprick b/l feet Lungs:  clear to auscultation, breath sounds equal bilaterally, no respiratory distress Cardio:  regular rate and rhythm without murmurs, heart sounds without clicks or rubs Psych: well oriented with normal range of affect and age-appropriate judgement/insight, alert and oriented x4.  Assessment and Plan  Diabetes mellitus type 2 in obese (HCC) - Plan: Hemoglobin A1c, HM DIABETES FOOT EXAM, POCT Urinalysis Dipstick (Automated)  Anxiety and depression  Side pain - Plan: Urinalysis, POCT Urinalysis Dipstick (Automated)  Ck above. Foot exam today. Cont meds pending result of above. Will consider  Actos vs SU. Counseled on diet and exercise. Hold off on insulin as long as possible.l Cont Zoloft, prn klonopin. Work stress much better. F/u in 3 mo pending above. The patient voiced understanding and agreement to the plan.  Jilda Roche [place], DO 08/28/18 8:52 AM

## 2018-08-28 NOTE — Patient Instructions (Addendum)
Please consider counseling. Contact [phone] to schedule an appointment or inquire about cost/insurance coverage.  Give [place] 2-3 business days to get the results of your labs back.   Keep the diet clean and stay active.  Let us know if you need anything.

## 2018-08-28 NOTE — Progress Notes (Signed)
Pre visit review using our clinic review tool, if applicable. No additional management support is needed unless otherwise documented below in the visit note. 

## 2018-08-30 ENCOUNTER — Other Ambulatory Visit: Payer: Self-pay | Admitting: Family Medicine

## 2018-08-30 DIAGNOSIS — E669 Obesity, unspecified: Principal | ICD-10-CM

## 2018-08-30 DIAGNOSIS — E1169 Type 2 diabetes mellitus with other specified complication: Secondary | ICD-10-CM

## 2018-09-01 MED ORDER — CLONAZEPAM 0.5 MG PO TABS: 0.2500 mg | ORAL_TABLET | Freq: Two times a day (BID) | ORAL | 3 refills | Status: DC | PRN

## 2018-09-01 MED ORDER — METFORMIN HCL ER 500 MG PO TB24: 1000.0000 mg | ORAL_TABLET | Freq: Every day | ORAL | 2 refills | Status: DC

## 2018-09-01 MED FILL — clonazePAM 0.5 MG TABS: 0.5 | 15 days supply | Qty: 30 | Fill #0

## 2018-09-03 DIAGNOSIS — E1169 Type 2 diabetes mellitus with other specified complication: Secondary | ICD-10-CM | POA: Diagnosis not present

## 2018-09-03 DIAGNOSIS — E669 Obesity, unspecified: Secondary | ICD-10-CM | POA: Diagnosis not present

## 2018-09-03 DIAGNOSIS — I10 Essential (primary) hypertension: Secondary | ICD-10-CM | POA: Diagnosis not present

## 2018-09-10 ENCOUNTER — Encounter: Payer: Self-pay | Admitting: Family Medicine

## 2018-09-15 MED FILL — JANUVIA 100 MG TABLET: 100 | 90 days supply | Qty: 90 | Fill #1

## 2018-09-15 MED FILL — SERTRALINE HCL 100 MG TAB: 100 | 30 days supply | Qty: 30 | Fill #1

## 2018-09-15 MED FILL — FARXIGA 5 MG TABLET: 5 | 30 days supply | Qty: 30 | Fill #2

## 2018-09-24 MED FILL — AMOXICILLIN 500 MG CAPSULE: 500 | 14 days supply | Qty: 56 | Fill #0

## 2018-09-24 MED FILL — PANTOPRAZOLE SOD DR 40 MG T: 40 | 14 days supply | Qty: 28 | Fill #0

## 2018-09-24 MED FILL — CLARITHROMYCIN 500 MG TAB: 500 | 14 days supply | Qty: 28 | Fill #0

## 2018-09-25 ENCOUNTER — Other Ambulatory Visit (HOSPITAL_COMMUNITY): Payer: Self-pay | Admitting: General Surgery

## 2018-09-25 ENCOUNTER — Other Ambulatory Visit: Payer: Self-pay | Admitting: General Surgery

## 2018-10-13 ENCOUNTER — Encounter: Payer: Self-pay | Admitting: Dietician

## 2018-10-13 ENCOUNTER — Encounter: Payer: 59 | Attending: General Surgery | Admitting: Dietician

## 2018-10-13 VITALS — Ht 61.0 in | Wt 242.5 lb

## 2018-10-13 DIAGNOSIS — E669 Obesity, unspecified: Secondary | ICD-10-CM | POA: Diagnosis not present

## 2018-10-13 NOTE — Patient Instructions (Signed)
Begin taking a multivitamin if you are not already. (And vitamin D, if you have a history of vitamin D deficiency.)  Start working through your Pre-Op Goals sheet discussed today. Pick 1 or 2 goals to begin working on for the next few weeks.

## 2018-10-13 NOTE — Progress Notes (Signed)
Bariatric Pre-Op Nutrition Assessment Medical Nutrition Therapy  Appt Start Time: 2:30pm  End time: 3:25pm  Patient was seen on 10/13/2018 for Pre-Operative Nutrition Assessment. Assessment and letter of approval faxed to [facility] B[facility] Program coordinator on 10/13/2018.   Planned surgery: Sleeve Gastrectomy  Pt expectation of surgery: to eventually come off all diabetic medications, relieve back pain, walk/move for longer periods of time, and feel confident in her body Pt expectation of dietitian: for nutrition/dietary guidance  Anthropometrics  Start weight at [facility]: 242.5 lbs (date: 10/13/2018) Height: 61 in BMI: 45.82 kg/m2    Clinical  Medical Hx: obesity, T2DM, anxiety, depression, asthma, kidney stones, fatty liver Surgeries: c-section (2006) Medications: buspirone, farxiga, hydroxyzine, Januvia, metformin, scopolamine, sertraline, albuterol inhaler, klonopin, probiotic  Allergies: naproxen   Psychosocial/Lifestyle Pt states her sister had bariatric surgery a couple of years ago. Pt lives with her husband and her 30 year old daughter.   24-Hr Dietary Recall First Meal: usually skips Snack: granola trail mix  Second Meal: rice + beans + chicken Snack: almonds (or cheese)  Third Meal: pasta + ground [place] sauce (or [employer] chicken nuggets + fries/mac & cheese)  Snack: none Beverages: sugar-free juice + water + coffee w/ powdered creamer  Food & Nutrition Related Hx Dietary Hx: Pt states she does not eat red meat, and eats a lot of baked chicken. Pt states she does not drink sodas  Estimated Daily Fluid Intake: 64 oz Supplements: probiotic   GI / Other Notable Symptoms: nausea, diarrhea (both frequently), gas   Physical Activity  Current average weekly physical activity: walking 3-4 days/week with daughter   Estimated Energy Needs Calories: 1600 Carbohydrate: 180g Protein: 120g Fat: 44g  Pre-Op Goals Reviewed with the Patient . Track  food and beverage intake (try MyFitness Pal or the Baritastic app) . Make healthy food choices while monitoring portion sizes . Avoid concentrated sugars and fried foods . Keep fat & sugar in the single digits per serving on food labels . Practice CHEWING your food (aim for applesauce consistency) . Practice not drinking 15 minutes before, during, and 30 minutes after each meal and snack . Avoid all carbonated beverages (ex: soda, sparkling beverages)  . Limit caffeinated beverages (ex: coffee, tea, energy drinks) . Avoid all sugar-sweetened beverages (ex: regular soda, sports drinks)  . Avoid alcohol  . Consume 3 meals per day or try to eat every 3-5 hours . Make a list of non-food related activities . Aim for 64-100 ounces of FLUID daily (with at least half of fluid intake being plain water)  . Aim for at least 60-80 grams of PROTEIN daily . Look for a liquid protein source that contains ?15 g protein and ?5 g carbohydrate (ex: shakes, drinks, shots) . Physical activity is an important part of a healthy lifestyle so keep it moving! The goal is to reach 150 minutes of exercise per week, including cardiovascular and weight baring activity.  Handouts Provided Include  . Bariatric Surgery handouts (Nutrition Visits, Pre-Op Goals, Protein Shakes, Vitamins & Minerals, Support Group 2020 Schedule)  Learning Style & Readiness for Change Teaching method utilized: Visual & Auditory  Demonstrated degree of understanding via: Teach Back  Barriers to learning/adherence to lifestyle change: None Stated  Next Steps Supervised Weight Loss (SWL) Visits Needed: 0  Patient is to call [facility] to enroll in Pre-Op Class (>2 weeks before surgery) and Post-Op Class (2 weeks after surgery) for further nutrition education when surgery date is scheduled.

## 2018-10-14 MED FILL — FARXIGA 5 MG TABLET: 5 | 30 days supply | Qty: 30 | Fill #3

## 2018-10-14 MED FILL — SERTRALINE HCL 100 MG TAB: 100 | 30 days supply | Qty: 30 | Fill #2

## 2018-10-27 ENCOUNTER — Encounter: Payer: 59 | Attending: General Surgery | Admitting: Skilled Nursing Facility1

## 2018-10-27 DIAGNOSIS — E669 Obesity, unspecified: Secondary | ICD-10-CM | POA: Diagnosis not present

## 2018-10-27 DIAGNOSIS — E1169 Type 2 diabetes mellitus with other specified complication: Secondary | ICD-10-CM

## 2018-10-28 NOTE — Progress Notes (Signed)
Pre-Operative Nutrition Class:  Appt start time: 2060   End time:  1830.  Patient was seen on 03/002/2020 for Pre-Operative [facility]ric [facility]   Surgery date:  Surgery type: Sleeve Start weight at [facility]: 242.5 Weight today: 245.2  Samples given per MNT protocol. Patient educated on appropriate usage: Bariatric Advantage Multivitamin Lot #[id number] Exp: 04/21  Bariatric Advantage Calcium  Lot # [id number] Exp: April 13th, 2020  Unjury Protein Shake Lot # [id number] Exp: April 8th, 2020  The following the learning objectives were met by the patient during this course:  Identify Pre-Op Dietary Goals and will begin 2 weeks pre-operatively  Identify appropriate sources of fluids and proteins   State protein recommendations and appropriate sources pre and post-operatively  Identify Post-Operative Dietary Goals and will follow for 2 weeks post-operatively  Identify appropriate multivitamin and calcium sources  Describe the need for physical activity post-operatively and will follow MD recommendations  State when to call healthcare provider regarding medication questions or post-operative complications  Handouts given during class include:  Pre-Op Bariatric Surgery Diet Handout  Protein Shake Handout  Post-Op Bariatric Surgery Nutrition Handout  BELT Program Information Flyer  Support Group Information Flyer  [facility] Bariatric Supplements Price List  Follow-Up Plan: Patient will follow-up at [facility] 2 weeks post operatively for diet advancement per MD.

## 2018-10-30 MED FILL — clonazePAM 0.5 MG TABS: 0.5 | 15 days supply | Qty: 30 | Fill #1

## 2018-11-04 MED FILL — metFORMIN HCL ER 500 MG TB2: 500 | 90 days supply | Qty: 180 | Fill #0

## 2018-11-06 ENCOUNTER — Other Ambulatory Visit: Payer: Self-pay | Admitting: Family Medicine

## 2018-11-06 DIAGNOSIS — F329 Major depressive disorder, single episode, unspecified: Secondary | ICD-10-CM

## 2018-11-06 DIAGNOSIS — F32A Depression, unspecified: Secondary | ICD-10-CM

## 2018-11-06 DIAGNOSIS — F419 Anxiety disorder, unspecified: Principal | ICD-10-CM

## 2018-11-10 ENCOUNTER — Other Ambulatory Visit: Payer: Self-pay | Admitting: Family Medicine

## 2018-11-10 DIAGNOSIS — F32A Depression, unspecified: Secondary | ICD-10-CM

## 2018-11-10 DIAGNOSIS — F329 Major depressive disorder, single episode, unspecified: Secondary | ICD-10-CM

## 2018-11-10 DIAGNOSIS — F419 Anxiety disorder, unspecified: Principal | ICD-10-CM

## 2018-11-10 MED FILL — busPIRone HCL 7.5 MG TABS: 7.5 | 90 days supply | Qty: 180 | Fill #0

## 2018-11-13 MED FILL — SERTRALINE HCL 100 MG TAB: 100 | 30 days supply | Qty: 30 | Fill #3

## 2018-11-13 MED FILL — FARXIGA 5 MG TABLET: 5 | 30 days supply | Qty: 30 | Fill #4

## 2018-11-17 ENCOUNTER — Encounter: Payer: Self-pay | Admitting: Family Medicine

## 2018-11-17 DIAGNOSIS — J029 Acute pharyngitis, unspecified: Secondary | ICD-10-CM

## 2018-11-17 MED ORDER — ALBUTEROL SULFATE HFA 108 (90 BASE) MCG/ACT IN AERS: 2.0000 | INHALATION_SPRAY | RESPIRATORY_TRACT | 5 refills | Status: DC | PRN

## 2018-11-17 MED FILL — VENTOLIN HFA 90 MCG INHALER: 108 (90 BAS | 17 days supply | Qty: 18 | Fill #0

## 2018-11-25 ENCOUNTER — Ambulatory Visit: Payer: 59 | Admitting: Psychiatry

## 2018-11-26 ENCOUNTER — Other Ambulatory Visit: Payer: Self-pay | Admitting: Family Medicine

## 2018-11-26 DIAGNOSIS — F32A Depression, unspecified: Secondary | ICD-10-CM

## 2018-11-26 DIAGNOSIS — F329 Major depressive disorder, single episode, unspecified: Secondary | ICD-10-CM

## 2018-11-26 DIAGNOSIS — F419 Anxiety disorder, unspecified: Principal | ICD-10-CM

## 2018-11-26 MED FILL — ACCU-CHEK FASTCLIX LANCETS: 90 days supply | Qty: 102 | Fill #1

## 2018-11-26 MED FILL — ACCU-CHEK GUIDE TEST STRIP: 90 days supply | Qty: 100 | Fill #1

## 2018-11-27 ENCOUNTER — Ambulatory Visit: Payer: Self-pay | Admitting: Family Medicine

## 2018-12-15 ENCOUNTER — Other Ambulatory Visit: Payer: Self-pay | Admitting: Family Medicine

## 2018-12-15 DIAGNOSIS — E669 Obesity, unspecified: Principal | ICD-10-CM

## 2018-12-15 DIAGNOSIS — E1169 Type 2 diabetes mellitus with other specified complication: Secondary | ICD-10-CM

## 2018-12-15 MED FILL — FARXIGA 5 MG TABLET: 5 | 30 days supply | Qty: 30 | Fill #5

## 2018-12-15 MED FILL — JANUVIA 100 MG TABLET: 100 | 90 days supply | Qty: 90 | Fill #0

## 2018-12-15 MED FILL — SERTRALINE HCL 100 MG TAB: 100 | 30 days supply | Qty: 30 | Fill #0

## 2018-12-16 ENCOUNTER — Ambulatory Visit: Payer: 59 | Admitting: Psychiatry

## 2018-12-17 ENCOUNTER — Ambulatory Visit: Payer: 59 | Admitting: Psychology

## 2018-12-24 ENCOUNTER — Ambulatory Visit: Payer: 59 | Admitting: Psychology

## 2019-01-15 ENCOUNTER — Other Ambulatory Visit: Payer: Self-pay | Admitting: Family Medicine

## 2019-01-15 DIAGNOSIS — E669 Obesity, unspecified: Secondary | ICD-10-CM

## 2019-01-15 DIAGNOSIS — E1169 Type 2 diabetes mellitus with other specified complication: Secondary | ICD-10-CM

## 2019-01-15 MED FILL — SERTRALINE HCL 100 MG TAB: 100 | 30 days supply | Qty: 30 | Fill #1

## 2019-01-15 MED FILL — FARXIGA 5 MG TABLET: 5 | 30 days supply | Qty: 30 | Fill #0

## 2019-01-22 ENCOUNTER — Other Ambulatory Visit: Payer: Self-pay | Admitting: Family Medicine

## 2019-01-22 DIAGNOSIS — F32A Depression, unspecified: Secondary | ICD-10-CM

## 2019-01-22 DIAGNOSIS — F329 Major depressive disorder, single episode, unspecified: Secondary | ICD-10-CM

## 2019-01-22 MED FILL — metFORMIN HCL ER 500 MG TB2: 500 | 90 days supply | Qty: 180 | Fill #1

## 2019-01-22 MED FILL — hydrOXYzine HCL 50 MG TABS: 50 | 30 days supply | Qty: 90 | Fill #0

## 2019-02-09 ENCOUNTER — Encounter: Payer: Self-pay | Admitting: Family Medicine

## 2019-02-11 ENCOUNTER — Encounter: Payer: Self-pay | Admitting: Family Medicine

## 2019-02-11 ENCOUNTER — Ambulatory Visit: Payer: 59 | Admitting: Family Medicine

## 2019-02-11 ENCOUNTER — Other Ambulatory Visit: Payer: Self-pay

## 2019-02-11 ENCOUNTER — Ambulatory Visit (INDEPENDENT_AMBULATORY_CARE_PROVIDER_SITE_OTHER): Payer: 59 | Admitting: Family Medicine

## 2019-02-11 VITALS — BP 112/88 | HR 103 | Temp 98.1°F | Ht 61.0 in | Wt 242.0 lb

## 2019-02-11 DIAGNOSIS — Z634 Disappearance and death of family member: Secondary | ICD-10-CM | POA: Diagnosis not present

## 2019-02-11 DIAGNOSIS — F329 Major depressive disorder, single episode, unspecified: Secondary | ICD-10-CM | POA: Diagnosis not present

## 2019-02-11 DIAGNOSIS — F419 Anxiety disorder, unspecified: Secondary | ICD-10-CM | POA: Diagnosis not present

## 2019-02-11 MED ORDER — ARIPIPRAZOLE 2 MG PO TABS: 2.0000 mg | ORAL_TABLET | Freq: Every day | ORAL | 3 refills | Status: DC

## 2019-02-11 MED FILL — ARIPiprazole 2 MG TABS: 2 | 30 days supply | Qty: 30 | Fill #0

## 2019-02-11 NOTE — Patient Instructions (Signed)
Send me a message in 4 weeks letting me know how you are doing.   Aim to do some physical exertion for 150 minutes per week. This is typically divided into 5 days per week, 30 minutes per day. The activity should be enough to get your heart rate up. Anything is better than nothing if you have time constraints.  Keep calling the behavioral health team.   Let us know if you need anything.

## 2019-02-11 NOTE — Progress Notes (Signed)
Chief Complaint  Patient presents with  . Depression    Subjective: Patient is a 38 y.o. female here for anxiety.  Several days ago, the patient's close friend had a son who passed away in a motorcycle accident. Another friend's child committed suicide. This has been extremely stressful for her. She has a hx of anxiety and depression. Pt had been doing well on Zoloft, BuSpar and prn Klonopin/hydroxyzine. Currently, none of that is helpful as she is experiencing insomnia, feeling on edge and racing thoughts. No homicidal/suicidal thoughts.    ROS: Psych: No SI or HI  Past Medical History:  Diagnosis Date  . Anxiety and depression 09/24/2016  . Asthma   . Chicken pox    As a child.  . Depression   . Diabetes mellitus type 2 in obese ([place]) 09/24/2016  . Fatty liver   . Kidney stones   . Migraines     Objective: BP 112/88 (BP Location: Left Arm, Patient Position: Sitting, Cuff Size: Normal)   Pulse (!) 103   Temp 98.1 F (36.7 C) (Oral)   Ht 5\' 1"  (1.549 m)   Wt 242 lb (109.8 kg)   SpO2 97%   BMI 45.73 kg/m  General: Awake, appears stated age HEENT: MMM, Lungs: No accessory muscle use Psych: Age appropriate judgment and insight, normal affect and mood  Assessment and Plan: Bereavement - Plan: ARIPiprazole (ABILIFY) 2 MG tablet  Encouraged to reach back out to [facility] to see if she can be seen soon. Cont SSRI, BuSpar, and prn hydroxyzine. Add low dose antipsychotic. Send me message in 4 weeks to let me know how things are going. Will f/u in 2 months.   The patient voiced understanding and agreement to the plan.  [place], DO 02/11/19  8:13 AM

## 2019-02-13 ENCOUNTER — Encounter: Payer: Self-pay | Admitting: Family Medicine

## 2019-02-13 ENCOUNTER — Ambulatory Visit: Payer: Self-pay

## 2019-02-13 NOTE — Telephone Encounter (Signed)
Incoming call from patient complaint of clear   Fluid  Oozing from her finger.  It was a blister.  That popped.From ring finger. Patient feel the rash is from new medication that she stared per .  [place].   Pt.  States that it itches.  Seeping clear fluid .  Denies headache or fever. Rash and swelling started this morning.  Recommended that Patient go to ED or Urgent care over the weekend if Sx.  [place].  Reviewed protocol and care advise given .  Patient voiced understanding.  Will follow up Monday if Sx.  don't worsen.    Reason for Disposition . SEVERE itching (i.e., interferes with sleep, normal activities or school)  Answer Assessment - Initial Assessment Questions 1. APPEARANCE of RASH: "Describe the rash." (e.g., spots, blisters, raised areas, skin peeling, scaly)  dry rash  2. SIZE: "How big are the spots?" (e.g., tip of pen, eraser, coin; inches, centimeters)      3. LOCATION: "Where is the rash located?"     Ring finger and thumb.   4. COLOR: "What color is the rash?" (Note: It is difficult to assess rash color in people with darker-colored skin. When this situation occurs, simply ask the caller to describe what they see.)     red 5. ONSET: "When did the rash begin?"     Swelling this morning 6. FEVER: "Do you have a fever?" If so, ask: "What is your temperature, how was it measured, and when did it start?"     denies 7. ITCHING: "Does the rash itch?" If so, ask: "How bad is the itch?" (Scale 1-10; or mild, moderate, severe)     Yes moderately 8. CAUSE: "What do you think is causing the rash?"     Questions if it the new medication 9. MEDICATION FACTORS: "Have you started any new medications within the last 2 weeks?" (e.g., antibiotics)      yesterday 10. OTHER SYMPTOMS: "Do you have any other symptoms?" (e.g., dizziness, headache, sore throat, joint pain)      denies 11. PREGNANCY: "Is there any chance you are pregnant?" "When was your last menstrual period?"       currently  Protocols used: RASH OR REDNESS - [facility]

## 2019-02-16 MED FILL — FARXIGA 5 MG TABLET: 5 | 30 days supply | Qty: 30 | Fill #1

## 2019-02-16 MED FILL — SERTRALINE HCL 100 MG TABS: 100 | 30 days supply | Qty: 30 | Fill #2

## 2019-02-16 MED FILL — BUSPIRONE HCL 7.5 MG TABS: 7.5 | 90 days supply | Qty: 180 | Fill #1

## 2019-02-16 NOTE — Telephone Encounter (Signed)
Called the patient she she stated she is doing better, started taking benadryl this weekend and has helped.

## 2019-02-16 NOTE — Telephone Encounter (Signed)
Can we see how she is doing? Ty.

## 2019-02-20 ENCOUNTER — Telehealth: Payer: Self-pay

## 2019-02-20 MED ORDER — AMMONIUM LACTATE 12 % EX LOTN: 1.0000 "application " | TOPICAL_LOTION | CUTANEOUS | 0 refills | Status: DC | PRN

## 2019-02-20 MED FILL — AMMONIUM LACTATE 12% LOTION: 12 | 14 days supply | Qty: 226 | Fill #0

## 2019-02-20 NOTE — Telephone Encounter (Signed)
Copied from [place] [id number]. Topic: General - Inquiry >> Feb 20, 2019 10:04 AM Richardo Priest, NT wrote: Reason for CRM: Patient is calling in stating that now her hands are very dry. States that is it very irritating because nothing is helping. States she has tried coconut oil and every lotion she has. Is now requesting a lotion be prescribed to help her. States she will send a Pharmacist, community message with a picture for reference.

## 2019-03-03 ENCOUNTER — Other Ambulatory Visit: Payer: Self-pay | Admitting: Family Medicine

## 2019-03-03 ENCOUNTER — Encounter: Payer: Self-pay | Admitting: Family Medicine

## 2019-03-03 MED ORDER — TRAZODONE HCL 50 MG PO TABS: 25.0000 mg | ORAL_TABLET | Freq: Every evening | ORAL | 3 refills | Status: DC | PRN

## 2019-03-03 MED FILL — hydrOXYzine HCL 50 MG TABS: 50 | 30 days supply | Qty: 90 | Fill #1

## 2019-03-03 MED FILL — traZODone HCL 50 MG TABS: 50 | 30 days supply | Qty: 30 | Fill #0

## 2019-03-04 MED FILL — clonazePAM 0.5 MG TABS: 0.5 | 15 days supply | Qty: 30 | Fill #0

## 2019-03-04 NOTE — Telephone Encounter (Signed)
Last OV  02/11/2019 Last RF---09/01/2018---#30 with 3 RF"s

## 2019-03-06 MED FILL — AMMONIUM LACTATE 12% LOTION: 12 | 14 days supply | Qty: 226 | Fill #0

## 2019-03-12 ENCOUNTER — Ambulatory Visit (INDEPENDENT_AMBULATORY_CARE_PROVIDER_SITE_OTHER): Payer: 59 | Admitting: Psychology

## 2019-03-12 DIAGNOSIS — F509 Eating disorder, unspecified: Secondary | ICD-10-CM | POA: Diagnosis not present

## 2019-03-12 MED FILL — JANUVIA 100 MG TABLET: 100 | 90 days supply | Qty: 90 | Fill #1

## 2019-03-12 MED FILL — AMMONIUM LACTATE 12% LOTION: 12 | 14 days supply | Qty: 226 | Fill #0

## 2019-03-12 MED FILL — FARXIGA 5 MG TABLET: 5 | 30 days supply | Qty: 30 | Fill #2

## 2019-03-12 MED FILL — SERTRALINE HCL 100 MG TAB: 100 | 30 days supply | Qty: 30 | Fill #3

## 2019-03-19 MED FILL — ARIPIPRAZOLE 2 MG TABS: 2 | 30 days supply | Qty: 30 | Fill #1

## 2019-03-26 ENCOUNTER — Ambulatory Visit (INDEPENDENT_AMBULATORY_CARE_PROVIDER_SITE_OTHER): Payer: 59 | Admitting: Psychology

## 2019-03-26 DIAGNOSIS — F509 Eating disorder, unspecified: Secondary | ICD-10-CM

## 2019-03-31 MED FILL — traZODone HCL 50 MG TABS: 50 | 30 days supply | Qty: 30 | Fill #1

## 2019-04-07 ENCOUNTER — Ambulatory Visit: Payer: Self-pay | Admitting: General Surgery

## 2019-04-15 ENCOUNTER — Encounter: Payer: Self-pay | Admitting: Family Medicine

## 2019-04-16 ENCOUNTER — Other Ambulatory Visit: Payer: Self-pay

## 2019-04-17 ENCOUNTER — Other Ambulatory Visit: Payer: Self-pay | Admitting: Family Medicine

## 2019-04-17 ENCOUNTER — Encounter: Payer: Self-pay | Admitting: Family Medicine

## 2019-04-17 ENCOUNTER — Ambulatory Visit: Payer: 59 | Admitting: Family Medicine

## 2019-04-17 DIAGNOSIS — E1169 Type 2 diabetes mellitus with other specified complication: Secondary | ICD-10-CM | POA: Diagnosis not present

## 2019-04-17 DIAGNOSIS — F32A Depression, unspecified: Secondary | ICD-10-CM

## 2019-04-17 DIAGNOSIS — F329 Major depressive disorder, single episode, unspecified: Secondary | ICD-10-CM | POA: Diagnosis not present

## 2019-04-17 DIAGNOSIS — E669 Obesity, unspecified: Secondary | ICD-10-CM

## 2019-04-17 DIAGNOSIS — F419 Anxiety disorder, unspecified: Secondary | ICD-10-CM | POA: Diagnosis not present

## 2019-04-17 MED ORDER — SERTRALINE HCL 100 MG PO TABS: 100.0000 mg | ORAL_TABLET | Freq: Every day | ORAL | 2 refills | Status: DC

## 2019-04-17 MED FILL — SERTRALINE HCL 100 MG TABS: 100 | 90 days supply | Qty: 90 | Fill #0

## 2019-04-17 MED FILL — FARXIGA 5 MG TABLET: 5 | 30 days supply | Qty: 30 | Fill #3

## 2019-04-17 NOTE — Progress Notes (Signed)
Chief Complaint  Patient presents with  . Follow-up    blood sugar    Subjective: Patient is a 38 y.o. female here for f/u BS.  Pt w hx of DM II, having bariatric surgery in a couple weeks. Currently on a liquid diet and her sugars have been in flux. Taking Metformin XR 1000 mg/d, Farxiga 5 mg/d, and Januvia 100 mg/d.  Sugars ranging from 90's-220's depending on what she eats. Wondering what she should do. 110's and below and she will have s/s's of hypoglycemia.   ROS: Endo: As noted above  Past Medical History:  Diagnosis Date  . Anxiety and depression 09/24/2016  . Asthma   . Chicken pox    As a child.  . Depression   . Diabetes mellitus type 2 in obese ([place]) 09/24/2016  . Fatty liver   . Kidney stones   . Migraines     Objective: BP 120/84 (BP Location: Left Arm, Patient Position: Sitting, Cuff Size: Large)   Pulse 95   Temp (!) 96.2 F (35.7 C) (Temporal)   Ht 5\' 1"  (1.549 m)   Wt 234 lb 4 oz (106.3 kg)   SpO2 96%   BMI 44.26 kg/m  General: Awake, appears stated age Lungs: No accessory muscle use Psych: Age appropriate judgment and insight, normal affect and mood  Assessment and Plan: Diabetes mellitus type 2 in obese ([place])- stop Farxiga, cont metformin and Januvia. Try to avoid simple sugars that will spike BS. I think she should reach out to her bariatric surgeon to see how essential a 2 week liquid diet is prior to the procedure.  Per patient, he did not seem strongly in favor of this at her last appt.    Anxiety and depression - Plan: sertraline (ZOLOFT) 100 MG tablet  Orders as above. The patient voiced understanding and agreement to the plan.  [place], DO 04/17/19  8:26 AM

## 2019-04-17 NOTE — Patient Instructions (Addendum)
Let's stop the [place] for now.   Try to cut down on the "simple sugars" as this can spike your sugars. Try to eat slowly.  Try taking Metamucil before meals as this can help.   I would consider asking your surgeon how essential a 2 week liquid diet leading into surgery is.   Let us know if you need anything.

## 2019-04-22 MED FILL — clonazePAM 0.5 MG TABS: 0.5 | 15 days supply | Qty: 30 | Fill #1

## 2019-04-22 MED FILL — metFORMIN HCL ER 500 MG TB2: 500 | 90 days supply | Qty: 180 | Fill #2

## 2019-04-24 ENCOUNTER — Other Ambulatory Visit (HOSPITAL_COMMUNITY)
Admission: RE | Admit: 2019-04-24 | Discharge: 2019-04-24 | Disposition: A | Discharge status: Home / Self Care | Payer: 59 | Source: Ambulatory Visit | Attending: General Surgery | Admitting: General Surgery

## 2019-04-24 DIAGNOSIS — Z20828 Contact with and (suspected) exposure to other viral communicable diseases: Secondary | ICD-10-CM | POA: Diagnosis not present

## 2019-04-24 DIAGNOSIS — Z01812 Encounter for preprocedural laboratory examination: Secondary | ICD-10-CM | POA: Insufficient documentation

## 2019-04-24 LAB — SARS CORONAVIRUS 2 (TAT 6-24 HRS): SARS Coronavirus 2: NEGATIVE

## 2019-04-24 NOTE — Patient Instructions (Addendum)
Martha Shannon - Preparing for SurgeryDUE TO COVID-19 ONLY ONE VISITOR IS ALLOWED TO COME WITH YOU AND STAY IN THE WAITING ROOM ONLY DURING PRE OP AND PROCEDURE DAY OF SURGERY. THE 1 VISITOR MAY VISIT WITH YOU AFTER SURGERY IN YOUR PRIVATE ROOM DURING VISITING HOURS ONLY!  YOU  HAVE COMPLETED YOUR COVID TEST .PLEASE BEGIN THE QUARANTINE INSTRUCTIONS AS OUTLINED IN YOUR HANDOUT.                Martha MaduroJessica Shannon    Your procedure is scheduled on: 04-28-19    Report to [facility] Main  Entrance    Report to Admitting at 9:10 AM     Call this number if you have problems the morning of surgery [phone]    Remember: MORNING OF SURGERY DRINK:   DRINK 1 G2 drink BEFORE YOU LEAVE HOME, DRINK ALL OF THE  G2 DRINK AT ONE TIME.       NO SOLID FOOD AFTER 6:00 PM THE NIGHT BEFORE YOUR SURGERY. YOU MAY DRINK CLEAR FLUIDS. THE G2 DRINK YOU DRINK BEFORE YOU LEAVE HOME WILL BE THE LAST FLUIDS YOU DRINK BEFORE SURGERY.     CLEAR LIQUID DIET   Foods Allowed                                                                     Foods Excluded  Coffee and tea, regular and decaf                             liquids that you cannot  Plain Jell-O any favor except red or purple                                           see through such as: Fruit ices (not with fruit pulp)                                     milk, soups, orange juice  Iced Popsicles                                    All solid food Carbonated beverages, regular and diet                                    Cranberry, grape and apple juices Sports drinks like Gatorade Lightly seasoned clear broth or consume(fat free) Sugar, honey syrup  Sample Menu Breakfast                                Lunch                                     Supper Cranberry juice  Beef broth                            Chicken broth Jell-O                                     Grape juice                           Apple juice Coffee or tea                         Jell-O                                      Popsicle                                                Coffee or tea                        Coffee or tea  _____________________________________________________________________     Take these medicines the morning of surgery with A SIP OF WATER:  Clonazepam (Klonopin, prn),  hydroxyzine if needed  BRUSH YOUR TEETH MORNING OF SURGERY AND RINSE YOUR MOUTH OUT, NO CHEWING GUM CANDY OR MINTS.                                 You may not have any metal on your body including hair pins and              piercings     Do not wear jewelry, make-up, lotions, powders or perfumes, deodorant              Do not wear nail polish.  Do not shave  48 hours prior to surgery.          Do not bring valuables to the hospital. [facility] IS NOT             RESPONSIBLE   FOR VALUABLES.  Contacts, dentures or bridgework may not be worn into surgery.  Leave suitcase in the car. After surgery it may be brought to your room.     Special Instructions: N/A              Please read over the following fact sheets you were given: _____________________________________________________________________    How to Manage Your Diabetes Before and After Surgery  Why is it important to control my blood sugar before and after surgery? . Improving blood sugar levels before and after surgery helps healing and can limit problems. . A way of improving blood sugar control is eating a healthy diet by: o  Eating less sugar and carbohydrates o  Increasing activity/exercise o  Talking with your doctor about reaching your blood sugar goals . High blood sugars (greater than 180 mg/dL) can raise your risk of infections and slow your recovery, so you will need to focus on controlling your diabetes during the weeks before surgery. . Make sure that the doctor who takes care of  your diabetes knows about your planned surgery including the date and location.  How do  I manage my blood sugar before surgery? . Check your blood sugar at least 4 times a day, starting 2 days before surgery, to make sure that the level is not too high or low. o Check your blood sugar the morning of your surgery when you wake up and every 2 hours until you get to the Short Stay unit. . If your blood sugar is less than 70 mg/dL, you will need to treat for low blood sugar: o Do not take insulin. o Treat a low blood sugar (less than 70 mg/dL) with  cup of clear juice (cranberry or apple), 4 glucose tablets, OR glucose gel. o Recheck blood sugar in 15 minutes after treatment (to make sure it is greater than 70 mg/dL). If your blood sugar is not greater than 70 mg/dL on recheck, call [phone] for further instructions. . Report your blood sugar to the short stay nurse when you get to Short Stay.  . If you are admitted to the hospital after surgery: o Your blood sugar will be checked by the staff and you will probably be given insulin after surgery (instead of oral diabetes medicines) to make sure you have good blood sugar levels. o The goal for blood sugar control after surgery is 80-180 mg/dL.   WHAT DO I DO ABOUT MY DIABETES MEDICATION?  Marland Kitchen. Do not take oral diabetes medicines (pills) the morning of surgery.  . THE DAYBEFORE SURGERY, take your usual dose of Januvia and Metformin.                 Before surgery, you can play an important role.  Because skin is not sterile, your skin needs to be as free of germs as possible.  You can reduce the number of germs on your skin by washing with CHG (chlorahexidine gluconate) soap before surgery.  CHG is an antiseptic cleaner which kills germs and bonds with the skin to continue killing germs even after washing. Please DO NOT use if you have an allergy to CHG or antibacterial soaps.  If your skin becomes reddened/irritated stop using the CHG and inform your nurse when you arrive at Short Stay. Do not shave (including legs and underarms)  for at least 48 hours prior to the first CHG shower.  You may shave your face/neck. Please follow these instructions carefully:  1.  Shower with CHG Soap the night before surgery and the  morning of Surgery.  2.  If you choose to wash your hair, wash your hair first as usual with your  normal  shampoo.  3.  After you shampoo, rinse your hair and body thoroughly to remove the  shampoo.                           4.  Use CHG as you would any other liquid soap.  You can apply chg directly  to the skin and wash                       Gently with a scrungie or clean washcloth.  5.  Apply the CHG Soap to your body ONLY FROM THE NECK DOWN.   Do not use on face/ open                           Wound or  open sores. Avoid contact with eyes, ears mouth and genitals (private parts).                       Wash face,  Genitals (private parts) with your normal soap.             6.  Wash thoroughly, paying special attention to the area where your surgery  will be performed.  7.  Thoroughly rinse your body with warm water from the neck down.  8.  DO NOT shower/wash with your normal soap after using and rinsing off  the CHG Soap.                9.  Pat yourself dry with a clean towel.            10.  Wear clean pajamas.            11.  Place clean sheets on your bed the night of your first shower and do not  sleep with pets. Day of Surgery : Do not apply any lotions/deodorants the morning of surgery.  Please wear clean clothes to the hospital/surgery center.  FAILURE TO FOLLOW THESE INSTRUCTIONS MAY RESULT IN THE CANCELLATION OF YOUR SURGERY PATIENT SIGNATURE_________________________________  NURSE SIGNATURE__________________________________  ________________________________________________________________________     PAIN IS EXPECTED AFTER SURGERY AND WILL NOT BE COMPLETELY ELIMINATED. AMBULATION AND TYLENOL WILL HELP REDUCE INCISIONAL AND GAS PAIN. MOVEMENT IS KEY!  YOU ARE EXPECTED TO BE OUT OF BED  WITHIN 4 HOURS OF ADMISSION TO YOUR PATIENT ROOM.  SITTING IN THE RECLINER THROUGHOUT THE DAY IS IMPORTANT FOR DRINKING FLUIDS AND MOVING GAS THROUGHOUT THE GI TRACT.  COMPRESSION STOCKINGS SHOULD BE WORN [place] UNLESS YOU ARE WALKING.   INCENTIVE SPIROMETER SHOULD BE USED EVERY HOUR WHILE AWAKE TO DECREASE POST-OPERATIVE COMPLICATIONS SUCH AS PNEUMONIA.  WHEN DISCHARGED HOME, IT IS IMPORTANT TO CONTINUE TO WALK EVERY HOUR AND USE THE INCENTIVE SPIROMETER EVERY HOUR.   WHAT IS A BLOOD TRANSFUSION? Blood Transfusion Information  A transfusion is the replacement of blood or some of its parts. Blood is made up of multiple cells which provide different functions.  Red blood cells carry oxygen and are used for blood loss replacement.  White blood cells fight against infection.  Platelets control bleeding.  Plasma helps clot blood.  Other blood products are available for specialized needs, such as hemophilia or other clotting disorders. BEFORE THE TRANSFUSION  Who gives blood for transfusions?   Healthy volunteers who are fully evaluated to make sure their blood is safe. This is blood bank blood. Transfusion therapy is the safest it has ever been in the practice of medicine. Before blood is taken from a donor, a complete history is taken to make sure that person has no history of diseases nor engages in risky social behavior (examples are intravenous drug use or sexual activity with multiple partners). The donor's travel history is screened to minimize risk of transmitting infections, such as malaria. The donated blood is tested for signs of infectious diseases, such as HIV and hepatitis. The blood is then tested to be sure it is compatible with you in order to minimize the chance of a transfusion reaction. If you or a relative donates blood, this is often done in anticipation of surgery and is not appropriate for emergency situations. It takes many days to process the  donated blood. RISKS AND COMPLICATIONS Although transfusion therapy is very safe and saves many lives, the  main dangers of transfusion include:   Getting an infectious disease.  Developing a transfusion reaction. This is an allergic reaction to something in the blood you were given. Every precaution is taken to prevent this. The decision to have a blood transfusion has been considered carefully by your caregiver before blood is given. Blood is not given unless the benefits outweigh the risks. AFTER THE TRANSFUSION  Right after receiving a blood transfusion, you will usually feel much better and more energetic. This is especially true if your red blood cells have gotten low (anemic). The transfusion raises the level of the red blood cells which carry oxygen, and this usually causes an energy increase.  The nurse administering the transfusion will monitor you carefully for complications. HOME CARE INSTRUCTIONS  No special instructions are needed after a transfusion. You may find your energy is better. Speak with your caregiver about any limitations on activity for underlying diseases you may have. SEEK MEDICAL CARE IF:   Your condition is not improving after your transfusion.  You develop redness or irritation at the intravenous (IV) site. SEEK IMMEDIATE MEDICAL CARE IF:  Any of the following symptoms occur over the next 12 hours:  Shaking chills.  You have a temperature by mouth above 102 F (38.9 C), not controlled by medicine.  Chest, back, or muscle pain.  People around you feel you are not acting correctly or are confused.  Shortness of breath or difficulty breathing.  Dizziness and fainting.  You get a rash or develop hives.  You have a decrease in urine output.  Your urine turns a dark color or changes to pink, red, or brown. Any of the following symptoms occur over the next 10 days:  You have a temperature by mouth above 102 F (38.9 C), not controlled by  medicine.  Shortness of breath.  Weakness after normal activity.  The white part of the eye turns yellow (jaundice).  You have a decrease in the amount of urine or are urinating less often.  Your urine turns a dark color or changes to pink, red, or brown. Document Released: 08/10/2000 Document Revised: 11/05/2011 Document Reviewed: 03/29/2008 [facility] Patient Information 2014 [place], [place].  _______________________________________________________________________

## 2019-04-27 ENCOUNTER — Other Ambulatory Visit: Payer: Self-pay | Admitting: *Deleted

## 2019-04-27 ENCOUNTER — Telehealth: Payer: Self-pay | Admitting: Dietician

## 2019-04-27 ENCOUNTER — Encounter (HOSPITAL_COMMUNITY): Payer: Self-pay

## 2019-04-27 ENCOUNTER — Other Ambulatory Visit: Payer: Self-pay

## 2019-04-27 ENCOUNTER — Encounter (HOSPITAL_COMMUNITY)
Admission: RE | Admit: 2019-04-27 | Discharge: 2019-04-27 | Disposition: A | Discharge status: Home / Self Care | Payer: 59 | Source: Ambulatory Visit | Attending: General Surgery | Admitting: General Surgery

## 2019-04-27 DIAGNOSIS — Z87442 Personal history of urinary calculi: Secondary | ICD-10-CM | POA: Diagnosis not present

## 2019-04-27 DIAGNOSIS — K760 Fatty (change of) liver, not elsewhere classified: Secondary | ICD-10-CM | POA: Diagnosis not present

## 2019-04-27 DIAGNOSIS — K219 Gastro-esophageal reflux disease without esophagitis: Secondary | ICD-10-CM | POA: Diagnosis not present

## 2019-04-27 DIAGNOSIS — Z6841 Body Mass Index (BMI) 40.0 and over, adult: Secondary | ICD-10-CM | POA: Insufficient documentation

## 2019-04-27 DIAGNOSIS — J45909 Unspecified asthma, uncomplicated: Secondary | ICD-10-CM | POA: Diagnosis not present

## 2019-04-27 DIAGNOSIS — E119 Type 2 diabetes mellitus without complications: Secondary | ICD-10-CM | POA: Insufficient documentation

## 2019-04-27 DIAGNOSIS — Z833 Family history of diabetes mellitus: Secondary | ICD-10-CM | POA: Insufficient documentation

## 2019-04-27 DIAGNOSIS — F329 Major depressive disorder, single episode, unspecified: Secondary | ICD-10-CM | POA: Diagnosis not present

## 2019-04-27 DIAGNOSIS — N63 Unspecified lump in unspecified breast: Secondary | ICD-10-CM | POA: Insufficient documentation

## 2019-04-27 DIAGNOSIS — E669 Obesity, unspecified: Secondary | ICD-10-CM | POA: Insufficient documentation

## 2019-04-27 DIAGNOSIS — F419 Anxiety disorder, unspecified: Secondary | ICD-10-CM | POA: Diagnosis not present

## 2019-04-27 DIAGNOSIS — Z01812 Encounter for preprocedural laboratory examination: Secondary | ICD-10-CM | POA: Insufficient documentation

## 2019-04-27 HISTORY — DX: Other specified postprocedural states: Z98.890

## 2019-04-27 HISTORY — DX: Other specified postprocedural states: R11.2

## 2019-04-27 LAB — CBC WITH DIFFERENTIAL/PLATELET
Abs Immature Granulocytes: 0.09 10*3/uL — ABNORMAL HIGH (ref 0.00–0.07)
Basophils Absolute: 0 10*3/uL (ref 0.0–0.1)
Basophils Relative: 0 %
Eosinophils Absolute: 0.1 10*3/uL (ref 0.0–0.5)
Eosinophils Relative: 1 %
HCT: 41 % (ref 36.0–46.0)
Hemoglobin: 12.1 g/dL (ref 12.0–15.0)
Immature Granulocytes: 1 %
Lymphocytes Relative: 29 %
Lymphs Abs: 3.1 10*3/uL (ref 0.7–4.0)
MCH: 20.6 pg — ABNORMAL LOW (ref 26.0–34.0)
MCHC: 29.5 g/dL — ABNORMAL LOW (ref 30.0–36.0)
MCV: 70 fL — ABNORMAL LOW (ref 80.0–100.0)
Monocytes Absolute: 0.6 10*3/uL (ref 0.1–1.0)
Monocytes Relative: 6 %
Neutro Abs: 6.6 10*3/uL (ref 1.7–7.7)
Neutrophils Relative %: 63 %
Platelets: 441 10*3/uL — ABNORMAL HIGH (ref 150–400)
RBC: 5.86 MIL/uL — ABNORMAL HIGH (ref 3.87–5.11)
RDW: 20.5 % — ABNORMAL HIGH (ref 11.5–15.5)
WBC: 10.6 10*3/uL — ABNORMAL HIGH (ref 4.0–10.5)
nRBC: 0 % (ref 0.0–0.2)

## 2019-04-27 LAB — COMPREHENSIVE METABOLIC PANEL
ALT: 84 U/L — ABNORMAL HIGH (ref 0–44)
AST: 64 U/L — ABNORMAL HIGH (ref 15–41)
Albumin: 3.9 g/dL (ref 3.5–5.0)
Alkaline Phosphatase: 49 U/L (ref 38–126)
Anion gap: 12 (ref 5–15)
BUN: 10 mg/dL (ref 6–20)
CO2: 24 mmol/L (ref 22–32)
Calcium: 9.3 mg/dL (ref 8.9–10.3)
Chloride: 101 mmol/L (ref 98–111)
Creatinine, Ser: 0.54 mg/dL (ref 0.44–1.00)
GFR calc Af Amer: >60 mL/min (ref >60)
GFR calc non Af Amer: >60 mL/min (ref >60)
Glucose, Bld: 96 mg/dL (ref 70–99)
Potassium: 4.2 mmol/L (ref 3.5–5.1)
Sodium: 137 mmol/L (ref 135–145)
Total Bilirubin: 0.5 mg/dL (ref 0.3–1.2)
Total Protein: 7.7 g/dL (ref 6.5–8.1)

## 2019-04-27 LAB — ABO/RH: ABO/RH(D): B POS

## 2019-04-27 LAB — HEMOGLOBIN A1C
Hgb A1c MFr Bld: 6.8 % — ABNORMAL HIGH (ref 4.8–5.6)
Mean Plasma Glucose: 148.46 mg/dL

## 2019-04-27 LAB — GLUCOSE, CAPILLARY: Glucose-Capillary: 104 mg/dL — ABNORMAL HIGH (ref 70–99)

## 2019-04-27 MED ORDER — BUPIVACAINE LIPOSOME 1.3 % IJ SUSP: 20.0000 mL | Freq: Once | INTRAMUSCULAR | Status: DC

## 2019-04-27 MED ORDER — BUPIVACAINE LIPOSOME 1.3 % IJ SUSP
20.0000 mL | Freq: Once | INTRAMUSCULAR | Status: DC
  Filled 2019-04-27: qty 20

## 2019-04-27 NOTE — Progress Notes (Signed)
PCP - wendling, nicholas Cardiologist -   Chest x-ray -  EKG - on chart from , received from [place] [facility] but done at [facility] heart care   Stress Test -  ECHO -  Cardiac Cath -   Sleep Study -  CPAP -   Fasting Blood Sugar - varying lately since removed from [place] __"whenever I feel like its bad"___ times a day  Blood Thinner Instructions: Aspirin Instructions: Last Dose:  Anesthesia review:   Patient denies shortness of breath, fever, cough and chest pain at PAT appointment   Patient verbalized understanding of instructions that were given to them at the PAT appointment. Patient was also instructed that they will need to review over the PAT instructions again at home before surgery.

## 2019-04-27 NOTE — Patient Outreach (Signed)
[name]) Care Management  04/27/2019  Martha Shannon [birth year] [id number]  Preoperative Screening Call/Transition of Care Referral received: 04/17/19 Surgery/procedure date: 04/28/19 Insurance: [place] Choice Plan  Subjective:  Initial successful telephone call to patient's preferred (mobile/home) number in order to complete preoperative screening. 2 HIPAA identifiers verified. Discussed purpose of preoperative call. Patient voices understanding and agrees to call. She states she understands the reason for the surgery and the expected time of arrival (9:30 am) . She says she completed her preadmission testing today and has no additional questions. She says she expects to be in the hospital 1-2 days.  She states she did not purchase the hospital indemnity insurance. She says she will have 24/7 care provided by her husband at home to assist in her recovery. She does not have and is not interested in completing advanced directives at this time. She says she is an active participant in  [employer] program, the Chartered loss adjuster for Type 2 DM self management assistance. She agrees to a post hospital discharge transition of care call.    Objective:  Per chart review, patient is scheduled for laparoscopic gastric sleeve surgery on 04/28/19 at [facility]. Comorbidities include:  Migraines, asthma, fatty liver, Type 2 DM Hgb A1C= 7.4% on 08/28/18) , kidney stones, anxiety and depression, iron deficiency anemia, Vit D deficiency  Assessment: Preoperative call completed, no preoperative needs identified.   Plan: RNCM will call patient for post hospital transition of care outreach within 72 hours of hospital discharge notification.  Barrington Ellison RN,CCM,CDE [employer] Management Coordinator Office Phone [phone] Office Fax [phone]

## 2019-04-27 NOTE — Telephone Encounter (Signed)
I called pt to assess their understanding of the pre-op nutrition recommendations through the teach back method to ensure the pts knowledge readiness in preparation for surgery.   Patient had a few questions regarding diet today (day before surgery scheduled for tomorrow 04/28/2019) and diet immediately following surgery. We reviewed these phases and I answered her questions.   Plan for patient to return to [facility] for Post-Op (1 on 1) on Wednesday, September 16th.    Martha Shannon) Dawsen Krieger, MS, RD, LDN

## 2019-04-28 ENCOUNTER — Encounter (HOSPITAL_COMMUNITY)
Admission: RE | Disposition: A | Discharge status: Home / Self Care | Payer: Self-pay | Source: Home / Self Care | Attending: General Surgery

## 2019-04-28 ENCOUNTER — Inpatient Hospital Stay (HOSPITAL_COMMUNITY): Payer: 59 | Admitting: Certified Registered"

## 2019-04-28 ENCOUNTER — Inpatient Hospital Stay (HOSPITAL_COMMUNITY)
Admission: RE | Admit: 2019-04-28 | Discharge: 2019-04-29 | DRG: 621 | Disposition: A | Discharge status: Home / Self Care | Payer: 59 | Attending: General Surgery | Admitting: General Surgery

## 2019-04-28 ENCOUNTER — Inpatient Hospital Stay (HOSPITAL_COMMUNITY): Payer: 59 | Admitting: Physician Assistant

## 2019-04-28 ENCOUNTER — Encounter (HOSPITAL_COMMUNITY): Payer: Self-pay | Admitting: *Deleted

## 2019-04-28 DIAGNOSIS — F329 Major depressive disorder, single episode, unspecified: Secondary | ICD-10-CM | POA: Diagnosis present

## 2019-04-28 DIAGNOSIS — E669 Obesity, unspecified: Secondary | ICD-10-CM | POA: Diagnosis present

## 2019-04-28 DIAGNOSIS — Z833 Family history of diabetes mellitus: Secondary | ICD-10-CM | POA: Diagnosis not present

## 2019-04-28 DIAGNOSIS — K219 Gastro-esophageal reflux disease without esophagitis: Secondary | ICD-10-CM | POA: Diagnosis present

## 2019-04-28 DIAGNOSIS — Z820 Family history of epilepsy and other diseases of the nervous system: Secondary | ICD-10-CM

## 2019-04-28 DIAGNOSIS — Z6841 Body Mass Index (BMI) 40.0 and over, adult: Secondary | ICD-10-CM

## 2019-04-28 DIAGNOSIS — K760 Fatty (change of) liver, not elsewhere classified: Secondary | ICD-10-CM | POA: Diagnosis present

## 2019-04-28 DIAGNOSIS — Z79899 Other long term (current) drug therapy: Secondary | ICD-10-CM | POA: Diagnosis not present

## 2019-04-28 DIAGNOSIS — Z7984 Long term (current) use of oral hypoglycemic drugs: Secondary | ICD-10-CM | POA: Diagnosis not present

## 2019-04-28 DIAGNOSIS — E119 Type 2 diabetes mellitus without complications: Secondary | ICD-10-CM | POA: Diagnosis not present

## 2019-04-28 DIAGNOSIS — J45909 Unspecified asthma, uncomplicated: Secondary | ICD-10-CM | POA: Diagnosis present

## 2019-04-28 DIAGNOSIS — Z886 Allergy status to analgesic agent status: Secondary | ICD-10-CM | POA: Diagnosis not present

## 2019-04-28 DIAGNOSIS — Z87442 Personal history of urinary calculi: Secondary | ICD-10-CM

## 2019-04-28 DIAGNOSIS — Z8249 Family history of ischemic heart disease and other diseases of the circulatory system: Secondary | ICD-10-CM | POA: Diagnosis not present

## 2019-04-28 DIAGNOSIS — F419 Anxiety disorder, unspecified: Secondary | ICD-10-CM | POA: Diagnosis present

## 2019-04-28 DIAGNOSIS — E1169 Type 2 diabetes mellitus with other specified complication: Secondary | ICD-10-CM

## 2019-04-28 HISTORY — PX: LAPAROSCOPIC GASTRIC SLEEVE RESECTION: SHX5895

## 2019-04-28 LAB — TYPE AND SCREEN
ABO/RH(D): B POS
Antibody Screen: NEGATIVE

## 2019-04-28 LAB — GLUCOSE, CAPILLARY
Glucose-Capillary: 142 mg/dL — ABNORMAL HIGH (ref 70–99)
Glucose-Capillary: 114 mg/dL — ABNORMAL HIGH (ref 70–99)

## 2019-04-28 LAB — HEMOGLOBIN AND HEMATOCRIT, BLOOD
HCT: 38.4 % (ref 36.0–46.0)
Hemoglobin: 11.6 g/dL — ABNORMAL LOW (ref 12.0–15.0)

## 2019-04-28 LAB — PREGNANCY, URINE: Preg Test, Ur: NEGATIVE

## 2019-04-28 SURGERY — GASTRECTOMY, SLEEVE, LAPAROSCOPIC
Anesthesia: General

## 2019-04-28 MED ORDER — SCOPOLAMINE 1 MG/3DAYS TD PT72: 1.0000 | MEDICATED_PATCH | Freq: Once | TRANSDERMAL | Status: DC

## 2019-04-28 MED ORDER — ACETAMINOPHEN 160 MG/5ML PO SOLN
1000.0000 mg | Freq: Three times a day (TID) | ORAL | Status: DC
  Administered 2019-04-28 – 2019-04-29 (×3): 1000 mg via ORAL
  Filled 2019-04-28 (×3): qty 40.6

## 2019-04-28 MED ORDER — ONDANSETRON HCL 4 MG/2ML IJ SOLN
INTRAMUSCULAR | Status: AC
  Filled 2019-04-28: qty 2

## 2019-04-28 MED ORDER — MEPERIDINE HCL 50 MG/ML IJ SOLN: 6.2500 mg | INTRAMUSCULAR | Status: DC | PRN

## 2019-04-28 MED ORDER — FAMOTIDINE IN NACL 20-0.9 MG/50ML-% IV SOLN
20.0000 mg | Freq: Two times a day (BID) | INTRAVENOUS | Status: DC
  Administered 2019-04-28 – 2019-04-29 (×2): 20 mg via INTRAVENOUS
  Filled 2019-04-28 (×2): qty 50

## 2019-04-28 MED ORDER — SIMETHICONE 80 MG PO CHEW
80.0000 mg | CHEWABLE_TABLET | Freq: Four times a day (QID) | ORAL | Status: DC | PRN
  Administered 2019-04-28 – 2019-04-29 (×2): 80 mg via ORAL
  Filled 2019-04-28 (×2): qty 1

## 2019-04-28 MED ORDER — MIDAZOLAM HCL 2 MG/2ML IJ SOLN
INTRAMUSCULAR | Status: DC | PRN
  Administered 2019-04-28: 2 mg via INTRAVENOUS

## 2019-04-28 MED ORDER — DEXAMETHASONE SODIUM PHOSPHATE 10 MG/ML IJ SOLN
INTRAMUSCULAR | Status: DC | PRN
  Administered 2019-04-28: 4 mg via INTRAVENOUS

## 2019-04-28 MED ORDER — ROCURONIUM BROMIDE 10 MG/ML (PF) SYRINGE
PREFILLED_SYRINGE | INTRAVENOUS | Status: AC
  Filled 2019-04-28: qty 10

## 2019-04-28 MED ORDER — SODIUM CHLORIDE 0.9 % IV SOLN
2.0000 g | INTRAVENOUS | Status: AC
  Administered 2019-04-28: 2 g via INTRAVENOUS
  Filled 2019-04-28: qty 2

## 2019-04-28 MED ORDER — EPHEDRINE SULFATE-NACL 50-0.9 MG/10ML-% IV SOSY
PREFILLED_SYRINGE | INTRAVENOUS | Status: DC | PRN
  Administered 2019-04-28 (×2): 5 mg via INTRAVENOUS

## 2019-04-28 MED ORDER — HYDROMORPHONE HCL 1 MG/ML IJ SOLN
0.2500 mg | INTRAMUSCULAR | Status: DC | PRN
  Administered 2019-04-28 (×3): 0.5 mg via INTRAVENOUS

## 2019-04-28 MED ORDER — EPHEDRINE 5 MG/ML INJ
INTRAVENOUS | Status: AC
  Filled 2019-04-28: qty 20

## 2019-04-28 MED ORDER — SUGAMMADEX SODIUM 500 MG/5ML IV SOLN
INTRAVENOUS | Status: AC
  Filled 2019-04-28: qty 5

## 2019-04-28 MED ORDER — LIDOCAINE 2% (20 MG/ML) 5 ML SYRINGE
INTRAMUSCULAR | Status: DC | PRN
  Administered 2019-04-28: 80 mg via INTRAVENOUS

## 2019-04-28 MED ORDER — TRAZODONE HCL 50 MG PO TABS: 25.0000 mg | ORAL_TABLET | Freq: Every evening | ORAL | Status: DC | PRN

## 2019-04-28 MED ORDER — CHLORHEXIDINE GLUCONATE 4 % EX LIQD: 60.0000 mL | Freq: Once | CUTANEOUS | Status: DC

## 2019-04-28 MED ORDER — SUGAMMADEX SODIUM 200 MG/2ML IV SOLN
INTRAVENOUS | Status: DC | PRN
  Administered 2019-04-28: 250 mg via INTRAVENOUS

## 2019-04-28 MED ORDER — LACTATED RINGERS IR SOLN
Status: DC | PRN
  Administered 2019-04-28: 1

## 2019-04-28 MED ORDER — ALBUTEROL SULFATE (2.5 MG/3ML) 0.083% IN NEBU: 2.5000 mg | INHALATION_SOLUTION | RESPIRATORY_TRACT | Status: DC | PRN

## 2019-04-28 MED ORDER — HYDROXYZINE HCL 25 MG PO TABS: 50.0000 mg | ORAL_TABLET | Freq: Three times a day (TID) | ORAL | Status: DC | PRN

## 2019-04-28 MED ORDER — DEXTROSE-NACL 5-0.45 % IV SOLN
INTRAVENOUS | Status: DC
  Administered 2019-04-28: 16:00:00 via INTRAVENOUS

## 2019-04-28 MED ORDER — HYDROMORPHONE HCL 1 MG/ML IJ SOLN
INTRAMUSCULAR | Status: AC
  Filled 2019-04-28: qty 2

## 2019-04-28 MED ORDER — LACTATED RINGERS IV SOLN
INTRAVENOUS | Status: DC
  Administered 2019-04-28: 10:00:00 via INTRAVENOUS

## 2019-04-28 MED ORDER — GABAPENTIN 300 MG PO CAPS
300.0000 mg | ORAL_CAPSULE | ORAL | Status: AC
  Administered 2019-04-28: 300 mg via ORAL
  Filled 2019-04-28: qty 1

## 2019-04-28 MED ORDER — FENTANYL CITRATE (PF) 100 MCG/2ML IJ SOLN
INTRAMUSCULAR | Status: AC
  Filled 2019-04-28: qty 2

## 2019-04-28 MED ORDER — 0.9 % SODIUM CHLORIDE (POUR BTL) OPTIME
TOPICAL | Status: DC | PRN
  Administered 2019-04-28: 1000 mL

## 2019-04-28 MED ORDER — PROMETHAZINE HCL 25 MG/ML IJ SOLN: 6.2500 mg | INTRAMUSCULAR | Status: DC | PRN

## 2019-04-28 MED ORDER — ONDANSETRON HCL 4 MG/2ML IJ SOLN
INTRAMUSCULAR | Status: DC | PRN
  Administered 2019-04-28: 4 mg via INTRAVENOUS

## 2019-04-28 MED ORDER — GABAPENTIN 100 MG PO CAPS
200.0000 mg | ORAL_CAPSULE | Freq: Two times a day (BID) | ORAL | Status: DC
  Administered 2019-04-28 – 2019-04-29 (×2): 200 mg via ORAL
  Filled 2019-04-28 (×3): qty 2

## 2019-04-28 MED ORDER — ONDANSETRON HCL 4 MG/2ML IJ SOLN: 4.0000 mg | INTRAMUSCULAR | Status: DC | PRN

## 2019-04-28 MED ORDER — BUPIVACAINE HCL (PF) 0.25 % IJ SOLN
INTRAMUSCULAR | Status: AC
  Filled 2019-04-28: qty 30

## 2019-04-28 MED ORDER — DEXAMETHASONE SODIUM PHOSPHATE 4 MG/ML IJ SOLN: 4.0000 mg | INTRAMUSCULAR | Status: DC

## 2019-04-28 MED ORDER — HYDRALAZINE HCL 20 MG/ML IJ SOLN: 10.0000 mg | INTRAMUSCULAR | Status: DC | PRN

## 2019-04-28 MED ORDER — LIDOCAINE 2% (20 MG/ML) 5 ML SYRINGE
INTRAMUSCULAR | Status: DC | PRN
  Administered 2019-04-28: 1.5 mg/kg/h via INTRAVENOUS

## 2019-04-28 MED ORDER — DEXAMETHASONE SODIUM PHOSPHATE 10 MG/ML IJ SOLN
INTRAMUSCULAR | Status: AC
  Filled 2019-04-28: qty 1

## 2019-04-28 MED ORDER — APREPITANT 40 MG PO CAPS
40.0000 mg | ORAL_CAPSULE | ORAL | Status: AC
  Administered 2019-04-28: 40 mg via ORAL
  Filled 2019-04-28: qty 1

## 2019-04-28 MED ORDER — BUPIVACAINE HCL 0.25 % IJ SOLN
INTRAMUSCULAR | Status: DC | PRN
  Administered 2019-04-28: 30 mL

## 2019-04-28 MED ORDER — HEPARIN SODIUM (PORCINE) 5000 UNIT/ML IJ SOLN
5000.0000 [IU] | INTRAMUSCULAR | Status: AC
  Administered 2019-04-28: 5000 [IU] via SUBCUTANEOUS
  Filled 2019-04-28: qty 1

## 2019-04-28 MED ORDER — FENTANYL CITRATE (PF) 250 MCG/5ML IJ SOLN
INTRAMUSCULAR | Status: DC | PRN
  Administered 2019-04-28: 50 ug via INTRAVENOUS
  Administered 2019-04-28: 100 ug via INTRAVENOUS
  Administered 2019-04-28: 50 ug via INTRAVENOUS

## 2019-04-28 MED ORDER — SCOPOLAMINE 1 MG/3DAYS TD PT72
1.0000 | MEDICATED_PATCH | TRANSDERMAL | Status: DC
  Administered 2019-04-28: 1.5 mg via TRANSDERMAL
  Filled 2019-04-28: qty 1

## 2019-04-28 MED ORDER — CLONAZEPAM 0.5 MG PO TABS: 0.2500 mg | ORAL_TABLET | Freq: Two times a day (BID) | ORAL | Status: DC | PRN

## 2019-04-28 MED ORDER — KETAMINE HCL 10 MG/ML IJ SOLN
INTRAMUSCULAR | Status: AC
  Filled 2019-04-28: qty 1

## 2019-04-28 MED ORDER — MIDAZOLAM HCL 2 MG/2ML IJ SOLN: 0.5000 mg | Freq: Once | INTRAMUSCULAR | Status: DC | PRN

## 2019-04-28 MED ORDER — ENSURE MAX PROTEIN PO LIQD
2.0000 [oz_av] | ORAL | Status: DC
  Administered 2019-04-29 (×5): 2 [oz_av] via ORAL

## 2019-04-28 MED ORDER — ACETAMINOPHEN 500 MG PO TABS: 1000.0000 mg | ORAL_TABLET | Freq: Three times a day (TID) | ORAL | Status: DC

## 2019-04-28 MED ORDER — MORPHINE SULFATE (PF) 2 MG/ML IV SOLN
1.0000 mg | INTRAVENOUS | Status: DC | PRN
  Administered 2019-04-28: 2 mg via INTRAVENOUS
  Filled 2019-04-28: qty 1

## 2019-04-28 MED ORDER — PROPOFOL 10 MG/ML IV BOLUS
INTRAVENOUS | Status: DC | PRN
  Administered 2019-04-28: 200 mg via INTRAVENOUS

## 2019-04-28 MED ORDER — BUSPIRONE HCL 5 MG PO TABS
7.5000 mg | ORAL_TABLET | Freq: Two times a day (BID) | ORAL | Status: DC
  Administered 2019-04-28 – 2019-04-29 (×2): 7.5 mg via ORAL
  Filled 2019-04-28 (×3): qty 2

## 2019-04-28 MED ORDER — SERTRALINE HCL 100 MG PO TABS
100.0000 mg | ORAL_TABLET | Freq: Every day | ORAL | Status: DC
  Administered 2019-04-28: 100 mg via ORAL
  Filled 2019-04-28: qty 1

## 2019-04-28 MED ORDER — GLYCOPYRROLATE PF 0.2 MG/ML IJ SOSY
PREFILLED_SYRINGE | INTRAMUSCULAR | Status: AC
  Filled 2019-04-28: qty 1

## 2019-04-28 MED ORDER — OXYCODONE HCL 5 MG/5ML PO SOLN
5.0000 mg | Freq: Four times a day (QID) | ORAL | Status: DC | PRN
  Administered 2019-04-28 – 2019-04-29 (×3): 5 mg via ORAL
  Filled 2019-04-28 (×3): qty 5

## 2019-04-28 MED ORDER — PROPOFOL 10 MG/ML IV BOLUS
INTRAVENOUS | Status: AC
  Filled 2019-04-28: qty 20

## 2019-04-28 MED ORDER — ROCURONIUM BROMIDE 10 MG/ML (PF) SYRINGE
PREFILLED_SYRINGE | INTRAVENOUS | Status: DC | PRN
  Administered 2019-04-28: 60 mg via INTRAVENOUS

## 2019-04-28 MED ORDER — KETAMINE HCL 10 MG/ML IJ SOLN
INTRAMUSCULAR | Status: DC | PRN
  Administered 2019-04-28: 10 mg via INTRAVENOUS
  Administered 2019-04-28: 15 mg via INTRAVENOUS

## 2019-04-28 MED ORDER — ACETAMINOPHEN 500 MG PO TABS
1000.0000 mg | ORAL_TABLET | ORAL | Status: AC
  Administered 2019-04-28: 1000 mg via ORAL
  Filled 2019-04-28: qty 2

## 2019-04-28 MED ORDER — LIDOCAINE 2% (20 MG/ML) 5 ML SYRINGE
INTRAMUSCULAR | Status: AC
  Filled 2019-04-28: qty 5

## 2019-04-28 MED ORDER — MIDAZOLAM HCL 2 MG/2ML IJ SOLN
INTRAMUSCULAR | Status: AC
  Filled 2019-04-28: qty 2

## 2019-04-28 MED ORDER — ENOXAPARIN SODIUM 30 MG/0.3ML ~~LOC~~ SOLN
30.0000 mg | Freq: Two times a day (BID) | SUBCUTANEOUS | Status: DC
  Administered 2019-04-28 – 2019-04-29 (×2): 30 mg via SUBCUTANEOUS
  Filled 2019-04-28 (×2): qty 0.3

## 2019-04-28 MED ORDER — BUPIVACAINE LIPOSOME 1.3 % IJ SUSP
INTRAMUSCULAR | Status: DC | PRN
  Administered 2019-04-28: 20 mL

## 2019-04-28 SURGICAL SUPPLY — 53 items
APPLIER CLIP ROT 13.4 12 LRG (CLIP) ×2
BAG LAPAROSCOPIC 12 15 PORT 16 (BASKET) ×1 IMPLANT
BAG RETRIEVAL 12/15 (BASKET) ×2
BENZOIN TINCTURE PRP APPL 2/3 (GAUZE/BANDAGES/DRESSINGS) ×2 IMPLANT
BLADE SURG SZ11 CARB STEEL (BLADE) ×2 IMPLANT
BNDG ADH 1X3 SHEER STRL LF (GAUZE/BANDAGES/DRESSINGS) ×12 IMPLANT
CABLE HIGH FREQUENCY MONO STRZ (ELECTRODE) IMPLANT
CHLORAPREP W/TINT 26 (MISCELLANEOUS) ×2 IMPLANT
CLIP APPLIE ROT 13.4 12 LRG (CLIP) ×1 IMPLANT
COVER SURGICAL LIGHT HANDLE (MISCELLANEOUS) ×2 IMPLANT
COVER WAND RF STERILE (DRAPES) IMPLANT
DRAPE UTILITY XL STRL (DRAPES) ×4 IMPLANT
ELECT REM PT RETURN 15FT ADLT (MISCELLANEOUS) ×2 IMPLANT
GLOVE BIOGEL PI IND STRL 7.0 (GLOVE) ×1 IMPLANT
GLOVE BIOGEL PI INDICATOR 7.0 (GLOVE) ×1
GLOVE SURG SS PI 7.0 STRL IVOR (GLOVE) ×2 IMPLANT
GOWN STRL REUS W/TWL LRG LVL3 (GOWN DISPOSABLE) ×2 IMPLANT
GOWN STRL REUS W/TWL XL LVL3 (GOWN DISPOSABLE) ×6 IMPLANT
GRASPER SUT TROCAR 14GX15 (MISCELLANEOUS) ×2 IMPLANT
HOVERMATT SINGLE USE (MISCELLANEOUS) ×2 IMPLANT
KIT BASIN OR (CUSTOM PROCEDURE TRAY) ×2 IMPLANT
KIT TURNOVER KIT A (KITS) IMPLANT
MARKER SKIN DUAL TIP RULER LAB (MISCELLANEOUS) ×2 IMPLANT
NEEDLE SPNL 22GX3.5 QUINCKE BK (NEEDLE) ×2 IMPLANT
PACK UNIVERSAL I (CUSTOM PROCEDURE TRAY) ×2 IMPLANT
RELOAD STAPLER BLUE 60MM (STAPLE) IMPLANT
RELOAD STAPLER GOLD 60MM (STAPLE) IMPLANT
RELOAD STAPLER GREEN 60MM (STAPLE) IMPLANT
SCISSORS LAP 5X45 EPIX DISP (ENDOMECHANICALS) IMPLANT
SET IRRIG TUBING LAPAROSCOPIC (IRRIGATION / IRRIGATOR) ×2 IMPLANT
SET TUBE SMOKE EVAC HIGH FLOW (TUBING) ×2 IMPLANT
SHEARS HARMONIC ACE PLUS 45CM (MISCELLANEOUS) ×2 IMPLANT
SLEEVE GASTRECTOMY 40FR VISIGI (MISCELLANEOUS) ×2 IMPLANT
SLEEVE XCEL OPT CAN 5 100 (ENDOMECHANICALS) ×4 IMPLANT
SOL ANTI FOG 6CC (MISCELLANEOUS) ×1 IMPLANT
SOLUTION ANTI FOG 6CC (MISCELLANEOUS) ×1
SPONGE LAP 18X18 RF (DISPOSABLE) ×2 IMPLANT
STAPLER ECHELON LONG 60 440 (INSTRUMENTS) ×2 IMPLANT
STAPLER RELOAD BLUE 60MM (STAPLE)
STAPLER RELOAD GOLD 60MM (STAPLE)
STAPLER RELOAD GREEN 60MM (STAPLE)
STRIP CLOSURE SKIN 1/2X4 (GAUZE/BANDAGES/DRESSINGS) ×2 IMPLANT
SUT ETHIBOND 0 36 GRN (SUTURE) IMPLANT
SUT MNCRL AB 4-0 PS2 18 (SUTURE) ×2 IMPLANT
SUT VICRYL 0 TIES 12 18 (SUTURE) ×2 IMPLANT
SYR 20ML LL LF (SYRINGE) ×2 IMPLANT
SYR 50ML LL SCALE MARK (SYRINGE) ×2 IMPLANT
TAPE STRIPS DRAPE STRL (GAUZE/BANDAGES/DRESSINGS) ×2 IMPLANT
TOWEL OR 17X26 10 PK STRL BLUE (TOWEL DISPOSABLE) ×2 IMPLANT
TOWEL OR NON WOVEN STRL DISP B (DISPOSABLE) ×2 IMPLANT
TROCAR BLADELESS 15MM (ENDOMECHANICALS) ×2 IMPLANT
TROCAR BLADELESS OPT 5 100 (ENDOMECHANICALS) ×2 IMPLANT
TUBING CONNECTING 10 (TUBING) ×2 IMPLANT

## 2019-04-28 NOTE — H&P (Signed)
Martha Shannon is an 38 y.o. female.   Chief Complaint: obesity HPI: 38 yo female with obesity and diabetes. She has completed all requirements and is ready to proceed with bariatric surgery.  Past Medical History:  Diagnosis Date  . Anxiety and depression 09/24/2016  . Asthma     controlled   . Chicken pox    As a child.  . Depression   . Diabetes mellitus type 2 in obese ([facility]CC) 09/24/2016  . Fatty liver   . Kidney stones   . Migraines   . PONV (postoperative nausea and vomiting)    vomiting with epidural during childbirth     Past Surgical History:  Procedure Laterality Date  . CESAREAN SECTION     2006.    Family History  Problem Relation Age of Onset  . Diabetes Mother   . Diabetes Father   . Hypertension Father   . Heart disease Father   . Diabetes Sister   . Alzheimer's disease Paternal Grandmother   . Alzheimer's disease Paternal Grandfather    Social History:  reports that she has never smoked. She has never used smokeless tobacco. She reports previous alcohol use. She reports that she does not use drugs.  Allergies:  Allergies  Allergen Reactions  . Naproxen     Legs felt agitated, and restleness     Medications Prior to Admission  Medication Sig Dispense Refill  . busPIRone (BUSPAR) 7.5 MG tablet TAKE 1 TABLET (7.5 MG TOTAL) BY MOUTH 2 (TWO) TIMES DAILY. (Patient taking differently: Take 7.5 mg by mouth 2 (two) times daily. Patient takes differently: takes both pills at bedtime) 180 tablet 1  . clonazePAM (KLONOPIN) 0.5 MG tablet TAKE 1/2 - 1 TABLET BY MOUTH TWO TIMES DAILY AS NEEDED FOR ANXIETY (Patient taking differently: Take 0.25-0.5 mg by mouth 2 (two) times daily as needed for anxiety. ) 30 tablet 3  . hydrOXYzine (ATARAX/VISTARIL) 50 MG tablet TAKE 1 TABLET BY MOUTH THREE TIMES A DAY AS NEEDED FOR ANXIETY (Patient taking differently: Take 50 mg by mouth 3 (three) times daily as needed for anxiety. ) 90 tablet 1  . JANUVIA 100 MG tablet TAKE 1 TABLET  BY MOUTH ONCE DAILY (Patient taking differently: Take 100 mg by mouth at bedtime. ) 90 tablet 1  . metFORMIN (GLUCOPHAGE-XR) 500 MG 24 hr tablet Take 2 tablets (1,000 mg total) by mouth at bedtime. 180 tablet 2  . sertraline (ZOLOFT) 100 MG tablet Take 1 tablet (100 mg total) by mouth daily. (Patient taking differently: Take 100 mg by mouth at bedtime. ) 90 tablet 2  . traZODone (DESYREL) 50 MG tablet Take 0.5-1 tablets (25-50 mg total) by mouth at bedtime as needed for sleep. 30 tablet 3  . ACCU-CHEK FASTCLIX LANCETS MISC USE AS DIRECTED ONCE DAILY TO CHECK BLOOD SUGAR. 102 each 6  . ACCU-CHEK GUIDE test strip USE AS DIRECTED ONCE DAILY TO CHECK BLOOD SUGAR. 100 each 6  . albuterol (PROVENTIL HFA;VENTOLIN HFA) 108 (90 Base) MCG/ACT inhaler Inhale 2 puffs into the lungs every 4 (four) hours as needed for wheezing or shortness of breath. 18 g 5  . ammonium lactate (LAC-HYDRIN) 12 % lotion Apply 1 application topically as needed for dry skin. (Patient not taking: Reported on 04/22/2019) 400 g 0  . scopolamine (TRANSDERM-SCOP) 1 MG/3DAYS Place 1 patch (1.5 mg total) onto the skin every 3 (three) days. (Patient not taking: Reported on 04/22/2019) 10 patch 0  . sertraline (ZOLOFT) 100 MG tablet TAKE 1 TABLET  BY MOUTH ONCE DAILY (Patient not taking: Reported on 04/22/2019) 30 tablet 3  . SSD 1 % cream APPLY 1 APPLICATION TOPICALLY DAILY. (Patient not taking: No sig reported) 50 g 1    Results for orders placed or performed during the hospital encounter of 04/28/19 (from the past 48 hour(s))  Pregnancy, urine STAT morning of surgery     Status: None   Collection Time: 04/28/19  9:03 AM  Result Value Ref Range   Preg Test, Ur NEGATIVE NEGATIVE    Comment:        THE SENSITIVITY OF THIS METHODOLOGY IS >20 mIU/mL. Performed at [facility], [place] [address], [place], [place] [address]   Glucose, capillary     Status: Abnormal   Collection Time: 04/28/19  9:10 AM  Result Value Ref Range    Glucose-Capillary 142 (H) 70 - 99 mg/dL   No results found.  Review of Systems  Constitutional: Negative for chills and fever.  HENT: Negative for hearing loss.   Eyes: Negative for blurred vision and double vision.  Respiratory: Negative for cough and hemoptysis.   Cardiovascular: Negative for chest pain and palpitations.  Gastrointestinal: Negative for abdominal pain, nausea and vomiting.  Genitourinary: Negative for dysuria and urgency.  Musculoskeletal: Negative for myalgias and neck pain.  Skin: Negative for itching and rash.  Neurological: Negative for dizziness, tingling and headaches.  Endo/Heme/Allergies: Does not bruise/bleed easily.  Psychiatric/Behavioral: Negative for depression and suicidal ideas.    Blood pressure (!) 155/94, pulse 88, temperature 99 F (37.2 C), temperature source Oral, resp. rate 16, height 5\' 1"  (1.549 m), weight 103.5 kg, last menstrual period 03/02/2019, SpO2 97 %. Physical Exam  Vitals reviewed. Constitutional: She is oriented to person, place, and time. She appears well-developed and well-nourished.  HENT:  Head: Normocephalic and atraumatic.  Eyes: Pupils are equal, round, and reactive to light. Conjunctivae and EOM are normal.  Neck: Normal range of motion. Neck supple.  Cardiovascular: Normal rate and regular rhythm.  Respiratory: Effort normal and breath sounds normal.  GI: Soft. Bowel sounds are normal. She exhibits no distension. There is no abdominal tenderness.  Musculoskeletal: Normal range of motion.  Neurological: She is alert and oriented to person, place, and time.  Skin: Skin is warm and dry.  Psychiatric: She has a normal mood and affect. Her behavior is normal.     Assessment/Plan 38 yo female with obesity and diabetes -lap sleeve gastrectomy -bariatric protocol  Mickeal Skinner, MD 04/28/2019, 10:47 AM

## 2019-04-28 NOTE — Op Note (Signed)
Preop Diagnosis: Obesity Class III  Postop Diagnosis: same  Procedure performed: laparoscopic Sleeve Gastrectomy  Assitant: Gaynelle AduEric Wilson  Indications:  The patient is a 38 y.o. year-old morbidly obese female who has been followed in the Bariatric Clinic as an outpatient. This patient was diagnosed with morbid obesity with a BMI of Body mass index is 43.1 kg/m. and significant co-morbidities including non-insulin dependent diabetes.  The patient was counseled extensively in the Bariatric Outpatient Clinic and after a thorough explanation of the risks and benefits of surgery (including death from complications, bowel leak, infection such as peritonitis and/or sepsis, internal hernia, bleeding, need for blood transfusion, bowel obstruction, organ failure, pulmonary embolus, deep venous thrombosis, wound infection, incisional hernia, skin breakdown, and others entailed on the consent form) and after a compliant diet and exercise program, the patient was scheduled for an elective laparoscopic sleeve gastrectomy.  Description of Operation:  Following informed consent, the patient was taken to the operating room and placed on the operating table in the supine position.  She had previously received prophylactic antibiotics and subcutaneous heparin for DVT prophylaxis in the pre-op holding area.  After induction of general endotracheal anesthesia by the anesthesiologist, the patient underwent placement of sequential compression devices and an oro-gastric tube.  A timeout was confirmed by the surgery and anesthesia teams.  The patient was adequately padded at all pressure points and placed on a footboard to prevent slippage from the OR table during extremes of position during surgery.  She underwent a routine sterile prep and drape of her entire abdomen.    Next, A transverse incision was made under the left subcostal area and a 5mm optical viewing trocar was introduced into the peritoneal cavity.  Pneumoperitoneum was applied with a high flow and low pressure. A laparoscope was inserted to confirm placement. A extraperitoneal block was then placed at the lateral abdominal wall using exparel diluted with marcaine. 5 additional incisions were placed: 1 5mm trocar to the left of the midline. 1 additional 5mm trocar in the left lateral area, 1 12mm trocar in the right mid abdomen, 1 5mm trocar in the right subcostal area, and a Nathanson retractor was placed through a subxiphoid incision.  The fat pad at the GE junction was incised and the gastrodiaphragmatic ligament was divided using the Harmonic scalpel. Next, a hole was created through the lesser omentum along the greater curve of the stomach to enter the lesser sac. The vessels along the greater omentum were  Then ligated and divided using the Harmonic scalpel moving towards the spleen and then short gastric vessels were ligated and divided in the same fashion to fully mobilize the fundus. The left crus was identified to ensure completion of the dissection. Next the antrum was measured and dissection continued inferiorly along the greater curve towards the pylorus and stopped 6cm from the pylorus.   A 40Fr ViSiGi dilator was placed into the esophgaus and along the lesser curve of the stomach and placed on suction. 2 non-reinforced 60mm purple AEON stapler(s) followed by 3 60mm orange AEON stapler(s) were used to make the resection along the antrum being sure to stay well away from the angularis by angling the jaws of the stapler towards the greater curve and later completing the resection staying along the ViSiGi and ensuring the fundus was not retained by appropriately retracting it lateral. Air was inserted through the ViSiGi to perform a leak test showing no bubbles and a neutral lie of the stomach.  The assistant then went  and performed an upper endoscopy and leak test. No bubbles were seen and the sleeve and antrum distended appropriately. The  specimen was then placed in an endocatch bag and removed by the 80mm port. The fascia of the 47mm port was closed with a 0 vicryl by suture passer. Hemostasis was ensured. Pneumoperitoneum was evacuated, all ports were removed and all incisions closed with 4-0 monocryl suture in subcuticular fashion. Steristrips and bandaids were put in place for dressing. The patient awoke from anesthesia and was brought to pacu in stable condition. All counts were correct.  Estimated blood loss: <33ml  Specimens:  Sleeve gastrectomy  Local Anesthesia: 50 ml Exparel:0.5% Marcaine mix  Post-Op Plan:       Pain Management: PO, prn      Antibiotics: Prophylactic      Anticoagulation: Prophylactic, Starting now      Post Op Studies/Consults: Not applicable      Intended Discharge: within 48h      Intended Outpatient Follow-Up: Two Week      Intended Outpatient Studies: Not Applicable      Other: Not Applicable   Martha Shannon

## 2019-04-28 NOTE — Anesthesia Postprocedure Evaluation (Signed)
Anesthesia Post Note  Patient: Martha Shannon  Procedure(s) Performed: LAPAROSCOPIC GASTRIC SLEEVE RESECTION, Upper Endo, ERAS Pathway (N/A )     Patient location during evaluation: PACU Anesthesia Type: General Level of consciousness: awake and alert, oriented and patient cooperative Pain management: pain level controlled Vital Signs Assessment: post-procedure vital signs reviewed and stable Respiratory status: spontaneous breathing, nonlabored ventilation and respiratory function stable Cardiovascular status: blood pressure returned to baseline and stable Postop Assessment: no apparent nausea or vomiting Anesthetic complications: no    Last Vitals:  Vitals:   04/28/19 1415 04/28/19 1442  BP: (!) 146/90 (!) 153/94  Pulse: 86 85  Resp: 14 16  Temp: 36.5 C (!) 36.4 C  SpO2: 97% 94%    Last Pain:  Vitals:   04/28/19 1442  TempSrc: Oral  PainSc:                  Isahia Hollerbach,E. Sandra Tellefsen

## 2019-04-28 NOTE — Transfer of Care (Signed)
Immediate Anesthesia Transfer of Care Note  Patient: Martha Shannon  Procedure(s) Performed: LAPAROSCOPIC GASTRIC SLEEVE RESECTION, Upper Endo, ERAS Pathway (N/A )  Patient Location: PACU  Anesthesia Type:General  Level of Consciousness: awake, alert  and oriented  Airway & Oxygen Therapy: Patient Spontanous Breathing and Patient connected to face mask oxygen  Post-op Assessment: Report given to RN and Post -op Vital signs reviewed and stable  Post vital signs: Reviewed and stable  Last Vitals:  Vitals Value Taken Time  BP    Temp    Pulse 95 04/28/19 1337  Resp 15 04/28/19 1337  SpO2 98 % 04/28/19 1337  Vitals shown include unvalidated device data.  Last Pain:  Vitals:   04/28/19 0955  TempSrc:   PainSc: 0-No pain         Complications: No apparent anesthesia complications

## 2019-04-28 NOTE — Progress Notes (Addendum)
Patient ambulated into room and bathroom, patient voided, and sat in the chair. Patient reaching up to 1000 on Incentive Spirometer.   Patient drank 2oz of water at 1800, tolerating well, will continue to monitor.

## 2019-04-28 NOTE — Progress Notes (Signed)
PHARMACY CONSULT FOR:  Risk Assessment for Post-Discharge VTE Following Bariatric Surgery  Post-Discharge VTE Risk Assessment: This patient's probability of 30-day post-discharge VTE is increased due to the factors marked:   Female    Age >/=60 years    BMI >/=50 kg/m2    CHF    Dyspnea at Rest    Paraplegia  X  Non-gastric-band surgery    Operation Time >/=3 hr    Return to OR     Length of Stay >/= 3 d      Hx of VTE   Hypercoagulable condition   Significant venous stasis   Predicted probability of 30-day post-discharge VTE: 0.16%  Other patient-specific factors to consider: n/a   Recommendation for Discharge: No pharmacologic prophylaxis post-discharge    Martha Shannon is a 38 y.o. female who underwent laparoscopic Sleeve Gastrectomy on 04/28/2019   Case start: 1228 Case end: 1329   Allergies  Allergen Reactions  . Naproxen     Legs felt agitated, and restleness     Patient Measurements: Height: 5\' 1"  (154.9 cm) Weight: 228 lb 2 oz (103.5 kg) IBW/kg (Calculated) : 47.8 Body mass index is 43.1 kg/m.  Recent Labs    04/27/19 1429  WBC 10.6*  HGB 12.1  HCT 41.0  PLT 441*  CREATININE 0.54  ALBUMIN 3.9  PROT 7.7  AST 64*  ALT 84*  ALKPHOS 49  BILITOT 0.5   Estimated Creatinine Clearance: 104.5 mL/min (by C-G formula based on SCr of 0.54 mg/dL).    Past Medical History:  Diagnosis Date  . Anxiety and depression 09/24/2016  . Asthma     controlled   . Chicken pox    As a child.  . Depression   . Diabetes mellitus type 2 in obese ([facility]) 09/24/2016  . Fatty liver   . Kidney stones   . Migraines   . PONV (postoperative nausea and vomiting)    vomiting with epidural during childbirth      Medications Prior to Admission  Medication Sig Dispense Refill Last Dose  . busPIRone (BUSPAR) 7.5 MG tablet TAKE 1 TABLET (7.5 MG TOTAL) BY MOUTH 2 (TWO) TIMES DAILY. (Patient taking differently: Take 7.5 mg by mouth 2 (two) times daily. Patient takes  differently: takes both pills at bedtime) 180 tablet 1 04/27/2019 at Unknown time  . clonazePAM (KLONOPIN) 0.5 MG tablet TAKE 1/2 - 1 TABLET BY MOUTH TWO TIMES DAILY AS NEEDED FOR ANXIETY (Patient taking differently: Take 0.25-0.5 mg by mouth 2 (two) times daily as needed for anxiety. ) 30 tablet 3 04/27/2019 at Unknown time  . hydrOXYzine (ATARAX/VISTARIL) 50 MG tablet TAKE 1 TABLET BY MOUTH THREE TIMES A DAY AS NEEDED FOR ANXIETY (Patient taking differently: Take 50 mg by mouth 3 (three) times daily as needed for anxiety. ) 90 tablet 1 Past Week at Unknown time  . JANUVIA 100 MG tablet TAKE 1 TABLET BY MOUTH ONCE DAILY (Patient taking differently: Take 100 mg by mouth at bedtime. ) 90 tablet 1 04/27/2019 at Unknown time  . metFORMIN (GLUCOPHAGE-XR) 500 MG 24 hr tablet Take 2 tablets (1,000 mg total) by mouth at bedtime. 180 tablet 2 04/27/2019 at Unknown time  . sertraline (ZOLOFT) 100 MG tablet Take 1 tablet (100 mg total) by mouth daily. (Patient taking differently: Take 100 mg by mouth at bedtime. ) 90 tablet 2 04/27/2019 at Unknown time  . traZODone (DESYREL) 50 MG tablet Take 0.5-1 tablets (25-50 mg total) by mouth at bedtime as needed for sleep.  30 tablet 3 Past Week at Unknown time  . ACCU-CHEK FASTCLIX LANCETS MISC USE AS DIRECTED ONCE DAILY TO CHECK BLOOD SUGAR. 102 each 6   . ACCU-CHEK GUIDE test strip USE AS DIRECTED ONCE DAILY TO CHECK BLOOD SUGAR. 100 each 6   . albuterol (PROVENTIL HFA;VENTOLIN HFA) 108 (90 Base) MCG/ACT inhaler Inhale 2 puffs into the lungs every 4 (four) hours as needed for wheezing or shortness of breath. 18 g 5 More than a month at Unknown time  . ammonium lactate (LAC-HYDRIN) 12 % lotion Apply 1 application topically as needed for dry skin. (Patient not taking: Reported on 04/22/2019) 400 g 0 Not Taking at Unknown time  . scopolamine (TRANSDERM-SCOP) 1 MG/3DAYS Place 1 patch (1.5 mg total) onto the skin every 3 (three) days. (Patient not taking: Reported on 04/22/2019) 10  patch 0 Not Taking at Unknown time  . sertraline (ZOLOFT) 100 MG tablet TAKE 1 TABLET BY MOUTH ONCE DAILY (Patient not taking: Reported on 04/22/2019) 30 tablet 3 Not Taking at Unknown time  . SSD 1 % cream APPLY 1 APPLICATION TOPICALLY DAILY. (Patient not taking: No sig reported) 50 g 1 Not Taking at Unknown time       Lucia Gaskinsham, Laquonda Welby P 04/28/2019,3:03 PM

## 2019-04-28 NOTE — Anesthesia Procedure Notes (Signed)
Procedure Name: Intubation Date/Time: 04/28/2019 12:06 PM Performed by: Eben Burow, CRNA Pre-anesthesia Checklist: Patient identified, Emergency Drugs available, Suction available, Patient being monitored and Timeout performed Patient Re-evaluated:Patient Re-evaluated prior to induction Oxygen Delivery Method: Circle system utilized Preoxygenation: Pre-oxygenation with 100% oxygen Induction Type: IV induction Ventilation: Mask ventilation without difficulty Laryngoscope Size: Mac and 4 Grade View: Grade I Tube type: Oral Tube size: 7.0 mm Number of attempts: 1 Airway Equipment and Method: Stylet Placement Confirmation: ETT inserted through vocal cords under direct vision,  positive ETCO2 and breath sounds checked- equal and bilateral Secured at: 22 cm Tube secured with: Tape Dental Injury: Teeth and Oropharynx as per pre-operative assessment

## 2019-04-28 NOTE — Op Note (Signed)
Martha Shannon [id number] Nov 19, 1980 04/28/2019  Preoperative diagnosis: morbid obesity  Postoperative diagnosis: Same   Procedure: upper endoscopy   Surgeon: Leighton Ruff. Keenya Matera M.D., FACS   Anesthesia: Gen.   Indications for procedure: 38 y.o. year old female undergoing Laparoscopic Gastric Sleeve Resection and an EGD was requested to evaluate the new gastric sleeve.   Description of procedure: After we have completed the sleeve resection, I scrubbed out and obtained the Olympus endoscope. I gently placed endoscope in the patient's oropharynx and gently glided it down the esophagus without any difficulty under direct visualization. Once I was in the gastric sleeve, I insufflated the stomach with air. I was able to cannulate and advanced the scope through the gastric sleeve. I was able to cannulate the duodenum with ease. Dr. Kieth Brightly had placed saline in the upper abdomen. Upon further insufflation of the gastric sleeve there was no evidence of bubbles. GE junction located at 37 cm.  Upon further inspection of the gastric sleeve, the mucosa appeared normal. There is no evidence of any mucosal abnormality. The sleeve was widely patent at the angularis. There was no evidence of bleeding. The gastric sleeve was decompressed. The scope was withdrawn. The patient tolerated this portion of the procedure well. Please see Dr Amie Portland operative note for details regarding the laparoscopic gastric sleeve resection.   Leighton Ruff. Redmond Pulling, MD, FACS  General, Bariatric, & Minimally Invasive Surgery  [facility], [place]

## 2019-04-28 NOTE — Progress Notes (Signed)
Discussed post op day goals with patient including ambulation, IS, diet progression, pain, and nausea control.  BSTOP education provided including BSTOP information guide, "Guide for Pain Management after your Bariatric Procedure".  Questions answered. 

## 2019-04-28 NOTE — Anesthesia Preprocedure Evaluation (Addendum)
Anesthesia Evaluation  Patient identified by MRN, date of birth, ID band Patient awake    Reviewed: Allergy & Precautions, NPO status , Patient's Chart, lab work & pertinent test results  History of Anesthesia Complications (+) PONV  Airway Mallampati: II  TM Distance: >3 FB Neck ROM: Full    Dental  (+) Chipped, Dental Advisory Given   Pulmonary COPD (last inhaler needed 2-3 months ago),  COPD inhaler,  04/24/2019 SARS coronavirus NEG   breath sounds clear to auscultation       Cardiovascular negative cardio ROS   Rhythm:Regular Rate:Normal     Neuro/Psych  Headaches, Anxiety Depression    GI/Hepatic GERD  Controlled and Medicated,Fatty liver: elevated LFTs   Endo/Other  diabetes, Oral Hypoglycemic AgentsMorbid obesity  Renal/GU      Musculoskeletal   Abdominal (+) + obese,   Peds  Hematology negative hematology ROS (+)   Anesthesia Other Findings   Reproductive/Obstetrics                            Anesthesia Physical Anesthesia Plan  ASA: III  Anesthesia Plan: General   Post-op Pain Management:    Induction: Intravenous  PONV Risk Score and Plan: 4 or greater and Scopolamine patch - Pre-op, Dexamethasone and Ondansetron  Airway Management Planned: Oral ETT  Additional Equipment:   Intra-op Plan:   Post-operative Plan: Extubation in OR  Informed Consent: I have reviewed the patients History and Physical, chart, labs and discussed the procedure including the risks, benefits and alternatives for the proposed anesthesia with the patient or authorized representative who has indicated his/her understanding and acceptance.     Dental advisory given  Plan Discussed with: CRNA and Surgeon  Anesthesia Plan Comments:        Anesthesia Quick Evaluation

## 2019-04-28 NOTE — Discharge Instructions (Signed)
° ° ° °GASTRIC BYPASS/SLEEVE ° Home Care Instructions ° ° These instructions are to help you care for yourself when you go home. ° °Call: If you have any problems. °• Call [phone] and ask for the surgeon on call °• If you need immediate help, come to the ER at [facility].  °• Tell the ER staff that you are a new post-op gastric bypass or gastric sleeve patient °  °Signs and symptoms to report: • Severe vomiting or nausea °o If you cannot keep down clear liquids for longer than 1 day, call your surgeon  °• Abdominal pain that does not get better after taking your pain medication °• Fever over 100.4° F with chills °• Heart beating over 100 beats a minute °• Shortness of breath at rest °• Chest pain °•  Redness, swelling, drainage, or foul odor at incision (surgical) sites °•  If your incisions open or pull apart °• Swelling or pain in calf (lower leg) °• Diarrhea (Loose bowel movements that happen often), frequent watery, uncontrolled bowel movements °• Constipation, (no bowel movements for 3 days) if this happens: Pick one °o Milk of Magnesia, 2 tablespoons by mouth, 3 times a day for 2 days if needed °o Stop taking Milk of Magnesia once you have a bowel movement °o Call your doctor if constipation continues °Or °o Miralax  (instead of Milk of Magnesia) following the label instructions °o Stop taking Miralax once you have a bowel movement °o Call your doctor if constipation continues °• Anything you think is not normal °  °Normal side effects after surgery: • Unable to sleep at night or unable to focus °• Irritability or moody °• Being tearful (crying) or depressed °These are common complaints, possibly related to your anesthesia medications that put you to sleep, stress of surgery, and change in lifestyle.  This usually goes away a few weeks after surgery.  If these feelings continue, call your primary care doctor. °  °Wound Care: You may have surgical glue, steri-strips, or staples over your incisions after  surgery °• Surgical glue:  Looks like a clear film over your incisions and will wear off a little at a time °• Steri-strips: Strips of tape over your incisions. You may notice a yellowish color on the skin under the steri-strips. This is used to make the   steri-strips stick better. Do not pull the steri-strips off - let them fall off °• Staples: Staples may be removed before you leave the hospital °o If you go home with staples, call [facility], [phone] at for an appointment with your surgeon’s nurse to have staples removed 10 days after surgery. °• Showering: You may shower two (2) days after your surgery unless your surgeon tells you differently °o Wash gently around incisions with warm soapy water, rinse well, and gently pat dry  °o No tub baths until staples are removed, steri-strips fall off or glue is gone.  °  °Medications: • Medications should be liquid or crushed if larger than the size of a dime °• Extended release pills (medication that release a little bit at a time through the day) should NOT be crushed or cut. (examples include XL, ER, DR, SR) °• Depending on the size and number of medications you take, you may need to space (take a few throughout the day)/change the time you take your medications so that you do not over-fill your pouch (smaller stomach) °• Make sure you follow-up with your primary care doctor to   make medication changes needed during rapid weight loss and life-style changes °• If you have diabetes, follow up with the doctor that orders your diabetes medication(s) within one week after surgery and check your blood sugar regularly. °• Do not drive while taking prescription pain medication  °• It is ok to take Tylenol by the bottle instructions with your pain medicine or instead of your pain medicine as needed.  DO NOT TAKE NSAIDS (EXAMPLES OF NSAIDS:  IBUPROFREN/ NAPROXEN)  °Diet:                    First 2 Weeks ° You will see the dietician t about two (2) weeks  after your surgery. The dietician will increase the types of foods you can eat if you are handling liquids well: °• If you have severe vomiting or nausea and cannot keep down clear liquids lasting longer than 1 day, call your surgeon @ ([phone]) °Protein Shake °• Drink at least 2 ounces of shake 5-6 times per day °• Each serving of protein shakes (usually 8 - 12 ounces) should have: °o 15 grams of protein  °o And no more than 5 grams of carbohydrate  °• Goal for protein each day: °o Men = 80 grams per day °o Women = 60 grams per day °• Protein powder may be added to fluids such as non-fat milk or Lactaid milk or unsweetened Soy/Almond milk (limit to 35 grams added protein powder per serving) ° °Hydration °• Slowly increase the amount of water and other clear liquids as tolerated (See Acceptable Fluids) °• Slowly increase the amount of protein shake as tolerated  °•  Sip fluids slowly and throughout the day.  Do not use straws. °• May use sugar substitutes in small amounts (no more than 6 - 8 packets per day; i.e. Splenda) ° °Fluid Goal °• The first goal is to drink at least 8 ounces of protein shake/drink per day (or as directed by the nutritionist); some examples of protein shakes are Syntrax Nectar, Adkins Advantage, EAS Edge HP, and Unjury. See handout from pre-op Bariatric Education Class: °o Slowly increase the amount of protein shake you drink as tolerated °o You may find it easier to slowly sip shakes throughout the day °o It is important to get your proteins in first °• Your fluid goal is to drink 64 - 100 ounces of fluid daily °o It may take a few weeks to build up to this °• 32 oz (or more) should be clear liquids  °And  °• 32 oz (or more) should be full liquids (see below for examples) °• Liquids should not contain sugar, caffeine, or carbonation ° °Clear Liquids: °• Water or Sugar-free flavored water (i.e. Fruit H2O, Propel) °• Decaffeinated coffee or tea (sugar-free) °• Crystal Lite, Wyler’s Lite,  Minute Maid Lite °• Sugar-free Jell-O °• Bouillon or broth °• Sugar-free Popsicle:   *Less than 20 calories each; Limit 1 per day ° °Full Liquids: °Protein Shakes/Drinks + 2 choices per day of other full liquids °• Full liquids must be: °o No More Than 15 grams of Carbs per serving  °o No More Than 3 grams of Fat per serving °• Strained low-fat cream soup (except Cream of Potato or Tomato) °• Non-Fat milk °• Fat-free Lactaid Milk °• Unsweetened Soy Or Unsweetened Almond Milk °• Low Sugar yogurt (Dannon Lite & Fit, Greek yogurt; Oikos Triple Zero; Chobani Simply 100; Yoplait 100 calorie Greek - No Fruit on the Bottom) ° °  °Vitamins   and Minerals • Start 1 day after surgery unless otherwise directed by your surgeon °• 2 Chewable Bariatric Specific Multivitamin / Multimineral Supplement with iron (Example: Bariatric Advantage Multi EA) °• Chewable Calcium with Vitamin D-3 °(Example: 3 Chewable Calcium Plus 600 with Vitamin D-3) °o Take 500 mg three (3) times a day for a total of 1500 mg each day °o Do not take all 3 doses of calcium at one time as it may cause constipation, and you can only absorb 500 mg  at a time  °o Do not mix multivitamins containing iron with calcium supplements; take 2 hours apart °• Menstruating women and those with a history of anemia (a blood disease that causes weakness) may need extra iron °o Talk with your doctor to see if you need more iron °• Do not stop taking or change any vitamins or minerals until you talk to your dietitian or surgeon °• Your Dietitian and/or surgeon must approve all vitamin and mineral supplements °  °Activity and Exercise: Limit your physical activity as instructed by your doctor.  It is important to continue walking at home.  During this time, use these guidelines: °• Do not lift anything greater than ten (10) pounds for at least two (2) weeks °• Do not go back to work or drive until your surgeon says you can °• You may have sex when you feel comfortable  °o It is  VERY important for female patients to use a reliable birth control method; fertility often increases after surgery  °o All hormonal birth control will be ineffective for 30 days after surgery due to medications given during surgery a barrier method must be used. °o Do not get pregnant for at least 18 months °• Start exercising as soon as your doctor tells you that you can °o Make sure your doctor approves any physical activity °• Start with a simple walking program °• Walk 5-15 minutes each day, 7 days per week.  °• Slowly increase until you are walking 30-45 minutes per day °Consider joining our BELT program. [phone] or email [email or web address] °  °Special Instructions Things to remember: °• Use your CPAP when sleeping if this applies to you ° °• [facility] has two free Bariatric Surgery Support Groups that meet monthly °o The 3rd Thursday of each month, 6 pm, [facility]  °o The 2nd Friday of each month, 11:45 am in the private dining room in the basement of [facility] °• It is very important to keep all follow up appointments with your surgeon, dietitian, primary care physician, and behavioral health practitioner °• Routine follow up schedule with your surgeon include appointments at 2-3 weeks, 6-8 weeks, 6 months, and 1 year at a minimum.  Your surgeon may request to see you more often.   °o After the first year, please follow up with your bariatric surgeon and dietitian at least once a year in order to maintain best weight loss results °[facility]: [phone] °[facility]: [phone] °Bariatric Nurse Coordinator: [phone] °  °   Reviewed and Endorsed  °by [place] Patient Education Committee, June, 2016 °Edits Approved: Aug, 2018 ° ° ° °

## 2019-04-29 ENCOUNTER — Encounter (HOSPITAL_COMMUNITY): Payer: Self-pay | Admitting: General Surgery

## 2019-04-29 LAB — CBC WITH DIFFERENTIAL/PLATELET
Abs Immature Granulocytes: 0.07 10*3/uL (ref 0.00–0.07)
Basophils Absolute: 0 10*3/uL (ref 0.0–0.1)
Basophils Relative: 0 %
Eosinophils Absolute: 0 10*3/uL (ref 0.0–0.5)
Eosinophils Relative: 0 %
HCT: 38 % (ref 36.0–46.0)
Hemoglobin: 11.3 g/dL — ABNORMAL LOW (ref 12.0–15.0)
Immature Granulocytes: 1 %
Lymphocytes Relative: 14 %
Lymphs Abs: 1.9 10*3/uL (ref 0.7–4.0)
MCH: 20.9 pg — ABNORMAL LOW (ref 26.0–34.0)
MCHC: 29.7 g/dL — ABNORMAL LOW (ref 30.0–36.0)
MCV: 70.4 fL — ABNORMAL LOW (ref 80.0–100.0)
Monocytes Absolute: 1 10*3/uL (ref 0.1–1.0)
Monocytes Relative: 7 %
Neutro Abs: 11 10*3/uL — ABNORMAL HIGH (ref 1.7–7.7)
Neutrophils Relative %: 78 %
Platelets: 432 10*3/uL — ABNORMAL HIGH (ref 150–400)
RBC: 5.4 MIL/uL — ABNORMAL HIGH (ref 3.87–5.11)
RDW: 20.7 % — ABNORMAL HIGH (ref 11.5–15.5)
WBC: 13.9 10*3/uL — ABNORMAL HIGH (ref 4.0–10.5)
nRBC: 0 % (ref 0.0–0.2)

## 2019-04-29 LAB — COMPREHENSIVE METABOLIC PANEL
ALT: 95 U/L — ABNORMAL HIGH (ref 0–44)
AST: 53 U/L — ABNORMAL HIGH (ref 15–41)
Albumin: 3.7 g/dL (ref 3.5–5.0)
Alkaline Phosphatase: 41 U/L (ref 38–126)
Anion gap: 8 (ref 5–15)
BUN: 6 mg/dL (ref 6–20)
CO2: 24 mmol/L (ref 22–32)
Calcium: 8.7 mg/dL — ABNORMAL LOW (ref 8.9–10.3)
Chloride: 104 mmol/L (ref 98–111)
Creatinine, Ser: 0.55 mg/dL (ref 0.44–1.00)
GFR calc Af Amer: >60 mL/min (ref >60)
GFR calc non Af Amer: >60 mL/min (ref >60)
Glucose, Bld: 176 mg/dL — ABNORMAL HIGH (ref 70–99)
Potassium: 4 mmol/L (ref 3.5–5.1)
Sodium: 136 mmol/L (ref 135–145)
Total Bilirubin: 0.5 mg/dL (ref 0.3–1.2)
Total Protein: 7.3 g/dL (ref 6.5–8.1)

## 2019-04-29 LAB — GLUCOSE, CAPILLARY
Glucose-Capillary: 132 mg/dL — ABNORMAL HIGH (ref 70–99)
Glucose-Capillary: 115 mg/dL — ABNORMAL HIGH (ref 70–99)

## 2019-04-29 MED ORDER — GABAPENTIN 100 MG PO CAPS: 200.0000 mg | ORAL_CAPSULE | Freq: Two times a day (BID) | ORAL | 0 refills | Status: DC

## 2019-04-29 MED ORDER — ACETAMINOPHEN 500 MG PO TABS: 1000.0000 mg | ORAL_TABLET | Freq: Three times a day (TID) | ORAL | 0 refills | Status: AC

## 2019-04-29 MED ORDER — PANTOPRAZOLE SODIUM 40 MG PO TBEC: 40.0000 mg | DELAYED_RELEASE_TABLET | Freq: Every day | ORAL | 0 refills | Status: DC

## 2019-04-29 MED ORDER — ONDANSETRON 4 MG PO TBDP: 4.0000 mg | ORAL_TABLET | Freq: Four times a day (QID) | ORAL | 0 refills | Status: DC | PRN

## 2019-04-29 MED ORDER — OXYCODONE HCL 5 MG PO TABS: 5.0000 mg | ORAL_TABLET | Freq: Four times a day (QID) | ORAL | 0 refills | Status: DC | PRN

## 2019-04-29 MED FILL — PANTOPRAZOLE SOD DR 40 MG T: 40 | 90 days supply | Qty: 90 | Fill #0

## 2019-04-29 MED FILL — oxyCODONE HCL 5 MG TABS: 5 | 3 days supply | Qty: 10 | Fill #0

## 2019-04-29 MED FILL — ONDANSETRON ODT 4 MG TABLET: 4 | 5 days supply | Qty: 20 | Fill #0

## 2019-04-29 MED FILL — GABAPENTIN 100 MG CAPSULE: 100 | 5 days supply | Qty: 20 | Fill #0

## 2019-04-29 NOTE — Progress Notes (Signed)
Patient alert and oriented, Post op day 1.  Provided support and encouragement.  Encouraged pulmonary toilet, ambulation and small sips of liquids.  All questions answered.  Will continue to monitor. 

## 2019-04-29 NOTE — Discharge Summary (Signed)
Physician Discharge Summary  Martha Shannon [id number] DOB: [birth year] DOA: 04/28/2019  PCP: Shelda Pal, DO  Admit date: 04/28/2019 Discharge date: 04/29/2019  Recommendations for Outpatient Follow-up:  1.  (include homehealth, outpatient follow-up instructions, specific recommendations for PCP to follow-up on, etc.)  Follow-up Information    Kinsinger, Arta Bruce, MD. Go on 05/22/2019.   Specialty: General Surgery Why: at 910 am Contact information: [address] [place] [address] [phone]        Kinsinger, Arta Bruce, MD .   Specialty: General Surgery Contact information: [place] [address] [phone]          Discharge Diagnoses:  Active Problems:   Obesity   Surgical Procedure: laparoscopic sleeve gastrectomy, upper endoscopy  Discharge Condition: Good Disposition: Home  Diet recommendation: Postoperative sleeve gastrectomy diet (liquids only)  Filed Weights   04/28/19 0920  Weight: 103.5 kg     Hospital Course:  The patient was admitted after undergoing laparoscopic sleeve gastrectomy. POD 0 she ambulated well. POD 1 she was started on the water diet protocol and tolerated 500 ml in the first shift. Once meeting the water amount she was advanced to bariatric protein shakes which they tolerated and were discharged home POD 1.  Treatments: surgery: laparoscopic sleeve gastrectomy  Discharge Instructions  Discharge Instructions    Ambulate hourly while awake   Complete by: As directed    Call MD for:  difficulty breathing, headache or visual disturbances   Complete by: As directed    Call MD for:  persistant dizziness or light-headedness   Complete by: As directed    Call MD for:  persistant nausea and vomiting   Complete by: As directed    Call MD for:  redness, tenderness, or signs of infection (pain, swelling, redness, odor or green/yellow discharge around incision site)   Complete  by: As directed    Call MD for:  severe uncontrolled pain   Complete by: As directed    Call MD for:  temperature >101 F   Complete by: As directed    Diet bariatric full liquid   Complete by: As directed    Discharge wound care:   Complete by: As directed    Remove Bandaids tomorrow, ok to shower tomorrow. Steristrips may fall off in 1-3 weeks.   Incentive spirometry   Complete by: As directed    Perform hourly while awake     Allergies as of 04/29/2019      Reactions   Naproxen    Legs felt agitated, and restleness       Medication List    STOP taking these medications   scopolamine 1 MG/3DAYS Commonly known as: TRANSDERM-SCOP   SSD 1 % cream Generic drug: silver sulfADIAZINE     TAKE these medications   Accu-Chek FastClix Lancets Misc USE AS DIRECTED ONCE DAILY TO CHECK BLOOD SUGAR.   Accu-Chek Guide test strip Generic drug: glucose blood USE AS DIRECTED ONCE DAILY TO CHECK BLOOD SUGAR.   acetaminophen 500 MG tablet Commonly known as: TYLENOL Take 2 tablets (1,000 mg total) by mouth every 8 (eight) hours for 5 days.   albuterol 108 (90 Base) MCG/ACT inhaler Commonly known as: VENTOLIN HFA Inhale 2 puffs into the lungs every 4 (four) hours as needed for wheezing or shortness of breath.   ammonium lactate 12 % lotion Commonly known as: LAC-HYDRIN Apply 1 application topically as needed for dry skin.   busPIRone 7.5 MG  tablet Commonly known as: BUSPAR TAKE 1 TABLET (7.5 MG TOTAL) BY MOUTH 2 (TWO) TIMES DAILY. What changed: additional instructions   clonazePAM 0.5 MG tablet Commonly known as: KLONOPIN TAKE 1/2 - 1 TABLET BY MOUTH TWO TIMES DAILY AS NEEDED FOR ANXIETY What changed: See the new instructions.   gabapentin 100 MG capsule Commonly known as: NEURONTIN Take 2 capsules (200 mg total) by mouth every 12 (twelve) hours.   hydrOXYzine 50 MG tablet Commonly known as: ATARAX/VISTARIL TAKE 1 TABLET BY MOUTH THREE TIMES A DAY AS NEEDED FOR  ANXIETY What changed: See the new instructions.   Januvia 100 MG tablet Generic drug: sitaGLIPtin TAKE 1 TABLET BY MOUTH ONCE DAILY What changed:   how much to take  when to take this   metFORMIN 500 MG 24 hr tablet Commonly known as: GLUCOPHAGE-XR Take 2 tablets (1,000 mg total) by mouth at bedtime. Notes to patient: Monitor Blood Sugar Frequently and keep a log for primary care physician, you may need to adjust medication dosage with rapid weight loss.     ondansetron 4 MG disintegrating tablet Commonly known as: ZOFRAN-ODT Take 1 tablet (4 mg total) by mouth every 6 (six) hours as needed for nausea or vomiting.   oxyCODONE 5 MG immediate release tablet Commonly known as: Oxy IR/ROXICODONE Take 1 tablet (5 mg total) by mouth every 6 (six) hours as needed for severe pain.   pantoprazole 40 MG tablet Commonly known as: PROTONIX Take 1 tablet (40 mg total) by mouth daily.   sertraline 100 MG tablet Commonly known as: ZOLOFT Take 1 tablet (100 mg total) by mouth daily. What changed:   when to take this  Another medication with the same name was removed. Continue taking this medication, and follow the directions you see here.   traZODone 50 MG tablet Commonly known as: DESYREL Take 0.5-1 tablets (25-50 mg total) by mouth at bedtime as needed for sleep.            Discharge Care Instructions  (From admission, onward)         Start     Ordered   04/29/19 0000  Discharge wound care:    Comments: Remove Bandaids tomorrow, ok to shower tomorrow. Steristrips may fall off in 1-3 weeks.   04/29/19 5277         Follow-up Information    Kinsinger, De Blanch, MD. Go on 05/22/2019.   Specialty: General Surgery Why: at 910 am Contact information: [address] [place] [address] [phone]        Kinsinger, De Blanch, MD .   Specialty: General Surgery Contact information: [address] [place] [address] [phone]             The results of significant diagnostics from this hospitalization (including imaging, microbiology, ancillary and laboratory) are listed below for reference.    Significant Diagnostic Studies: No results found.  Labs: Basic Metabolic Panel: Recent Labs  Lab 04/27/19 1429 04/29/19 0337  NA 137 136  K 4.2 4.0  CL 101 104  CO2 24 24  GLUCOSE 96 176*  BUN 10 6  CREATININE 0.54 0.55  CALCIUM 9.3 8.7*   Liver Function Tests: Recent Labs  Lab 04/27/19 1429 04/29/19 0337  AST 64* 53*  ALT 84* 95*  ALKPHOS 49 41  BILITOT 0.5 0.5  PROT 7.7 7.3  ALBUMIN 3.9 3.7    CBC: Recent Labs  Lab 04/27/19 1429 04/28/19 1553 04/29/19 1245  WBC 10.6*  --  13.9*  NEUTROABS 6.6  --  11.0*  HGB 12.1 11.6* 11.3*  HCT 41.0 38.4 38.0  MCV 70.0*  --  70.4*  PLT 441*  --  432*    CBG: Recent Labs  Lab 04/27/19 1328 04/28/19 0910 04/28/19 1337  GLUCAP 104* 142* 114*    Active Problems:   Obesity   VTE plan: no chemical prophylaxis recommended ([email or web address]/)  Time coordinating discharge: 15min

## 2019-04-29 NOTE — Progress Notes (Signed)
Patient alert and oriented, pain is controlled. Patient is tolerating fluids, advanced to protein shake today, patient is tolerating well.  Reviewed Gastric sleeve discharge instructions with patient and patient is able to articulate understanding.  Provided information on BELT program, Support Group and [facility] outpatient pharmacy. All questions answered, will continue to monitor.  Total fluid 720 Per dehydration protocol call back one week post op

## 2019-04-29 NOTE — Plan of Care (Signed)
Pt was discharged home today. Instructions were reviewed with patient, and questions were answered. Pt was taken to main entrance via wheelchair by NT.  

## 2019-04-30 ENCOUNTER — Telehealth: Payer: Self-pay | Admitting: Family Medicine

## 2019-04-30 MED ORDER — PIOGLITAZONE HCL 30 MG PO TABS: 30.0000 mg | ORAL_TABLET | Freq: Every day | ORAL | 1 refills | Status: DC

## 2019-04-30 MED FILL — PIOGLITAZONE HCL 30 MG TAB: 30 | 30 days supply | Qty: 30 | Fill #0

## 2019-04-30 NOTE — Telephone Encounter (Signed)
Attempted to notify pt via phone and left message to check  mychart acct. Message sent. 

## 2019-04-30 NOTE — Telephone Encounter (Signed)
Please advise 

## 2019-04-30 NOTE — Telephone Encounter (Signed)
Stop it. Actos to replace. TY.

## 2019-04-30 NOTE — Telephone Encounter (Signed)
Pt states she recently had gastric sleeve surgery and metFORMIN (GLUCOPHAGE-XR) 500 MG 24 hr tablets are too large for her to swallow/digest.  Please advise.   Pt: [phone]  Preferred Pharm:  [place], [place] - [place] [phone] (Phone) [phone] (Fax)

## 2019-05-01 ENCOUNTER — Encounter: Payer: Self-pay | Admitting: *Deleted

## 2019-05-01 ENCOUNTER — Other Ambulatory Visit: Payer: Self-pay | Admitting: *Deleted

## 2019-05-01 DIAGNOSIS — Z9189 Other specified personal risk factors, not elsewhere classified: Secondary | ICD-10-CM | POA: Insufficient documentation

## 2019-05-01 NOTE — Patient Outreach (Addendum)
[facility]) Care Management  05/01/2019  Martha Shannon [birth year] [id number]  Transition of care call/case closure   Referral received: 04/17/19 Initial outreach: 04/27/19 for preoperative call, 05/01/19 for postoperative call Surgery/procedure date: 04/28/19 Insurance: [place] Focus/Save/Choice Plan   Subjective: Initial successful telephone call to patient's preferred number in order to complete transition of care assessment; 2 HIPAA identifiers verified. Explained purpose of call and completed transition of care assessment.  Martha Shannon states she is doing well, denies post-operative problems, says surgical incisions are unremarkable, states surgical pain well managed with prescribed medications, tolerating diet, denies bowel or bladder problems.  Says she is using her incentive spirometer every 2 hours and that she is coughing up clear phlegm. Spouse and daughter are assisting with her recovery.  She reports her fasting blood sugars since discharge have been 109 both mornings. She says she notified [facility] that her Metformin was discontinued and Actos was added to her DM treatment regimen. She says she uses a Ambulance person, [facility]. She denies educational needs related to staying safe during the COVID 19 pandemic.  Martha Shannon expressed appreciation of transition of care call.    Objective:  Gordie had laparoscopic gastric sleeve surgery on 04/28/19 at [facility]. Comorbidities include:  Migraines, asthma, fatty liver, Type 2 DM Hgb A1C= 7.4% on 08/28/18 and 6.8% on 04/27/19) , kidney stones, anxiety and depression, iron deficiency anemia, Vit D deficiency She was discharged to home on 04/29/19 without the need for home health services or DME.   Assessment:  Patient voices good understanding of all discharge instructions.  See transition of care flowsheet for assessment details.   Plan:  Reviewed hospital discharge diagnosis of laparoscopic  gastric sleeve surgery and treatment plan using hospital discharge instructions, assessing medication adherence, reviewing problems requiring provider notification, and discussing the importance of follow up with surgeon and primary care provider as directed. Reviewed [place]'s announcements that all [place] members will receive the Healthy Lifestyle Premium rate in 2021. Sent secure e-mail to  [facility] RN, CDE,  [employer], that patient is doing well post operatively and that her DM regimen includes  Januvia and Actos.  No ongoing care management needs identified so will close case to [facility] Management services and route successful outreach letter with [facility] Management pamphlet and 24 Hour Nurse Line Magnet to [facility] clinical pool to be mailed to patient's home address.   Barrington Ellison RN,CCM,CDE [facility] Management Coordinator Office Phone [phone] Office Fax [phone]

## 2019-05-05 ENCOUNTER — Telehealth (HOSPITAL_COMMUNITY): Payer: Self-pay

## 2019-05-05 NOTE — Telephone Encounter (Signed)
Patient called to discuss post bariatric surgery follow up questions.  See below:   1.  Tell me about your pain and pain management?denies taking tylenol   2.  Let's talk about fluid intake.  How much total fluid are you taking in?31 ounces of fluid, denies any symptoms of dehydration including headache, dizziness, decreased urine output, or darkening urine.  Patient expressed a fear of overdrinking and causing problems with surgery. We discussed increasing fluid amounts and record amounts. patient given goal to drink 20 ounces bottle of water, 11.5 ounces protein shake, 3.5 ounces popsicle/jello to total at least 35 ounces.  The goal to increase fluid amounts daily.  Recall 05/06/2019  3.  How much protein have you taken in the last 2 days?30 grams of protein, 15 grams of protein from Protein 20  4.  Have you had nausea?  Tell me about when have experienced nausea and what you did to help?had nausea once took medication  5.  Has the frequency or color changed with your urine?making urine light yellow, normal color  6.  Tell me what your incisions look like?steri strips are coming off, has some bruising, states she feels pulling at one incision especially at night  7.  Have you been passing gas? BM?passing gas had bm  8.  If a problem or question were to arise who would you call?  Do you know contact numbers for [facility], [facility], and [facility]?  9.  How has the walking going?ambulating a lot per patient  10.  How are your vitamins and calcium going?  How are you taking them?taking Celebrate  multivitamin and calcium x 3 per day

## 2019-05-06 ENCOUNTER — Telehealth (HOSPITAL_COMMUNITY): Payer: Self-pay

## 2019-05-07 NOTE — Telephone Encounter (Signed)
Patient returned call to [facility] 05/06/2019 at 1341.  Requested 05/05/2019 patient journal all fluid intake for 24 hours.  Fluid intake at 45 ounces the last 24 hours.  Patient continues to deny s/s of dehydration including decreased urine output, headache, or dizziness.  Advised patient to increase fluid amounts daily, record information, self monitor for dehydration.  Reminded of dehydration sticker on discharge folder to help with self monitoring.  States understanding, follow up with dietitian 9/15 or call should concerns arise.

## 2019-05-13 ENCOUNTER — Encounter: Payer: 59 | Attending: General Surgery | Admitting: Dietician

## 2019-05-13 ENCOUNTER — Encounter: Payer: Self-pay | Admitting: Dietician

## 2019-05-13 ENCOUNTER — Other Ambulatory Visit: Payer: Self-pay

## 2019-05-13 DIAGNOSIS — E669 Obesity, unspecified: Secondary | ICD-10-CM | POA: Insufficient documentation

## 2019-05-13 NOTE — Progress Notes (Signed)
Bariatric Nutrition Education Start Time: 7:35am   End Time: 8:10am  2 Week Post-Operative Nutrition Education  Patient was seen on 05/13/2019 for post-operative nutrition education at [facility]   Surgery date: 04/28/2019 Surgery type: Sleeve Gastrectomy  Start weight at [facility]: 242.5 lbs (date: 10/13/2018) Weight today: 222.6 lbs Weight change: -20 lbs total   Body Composition Scale 05/13/2019  Weight (lbs) 222.6  BMI 42.1  Total Body Fat % 44     Visceral Fat 14  Fat-Free Mass % 55.9     Total Body Water % 42.4     Muscle-Mass (lbs) 29.7  Body Fat Displacement ---         Torso  (lbs) 60.6         Left Leg  (lbs) 12.1         Right Leg  (lbs) 12.1         Left Arm  (lbs) 6         Right Arm  (lbs) 6   24 Hr Dietary Recall Premier Protein shake (30g) (12oz) Protein 2O (15g) (16oz) Chicken Broth (12oz) Water (20oz)   The following the learning objectives were met by the patient during this course:  Identifies Phase 3 (Soft, High Protein Foods) Dietary Goals and will begin from 2 weeks post-operatively to 2 months post-operatively  Identifies appropriate sources of fluids and proteins   States protein recommendations and appropriate sources post-operatively  Identifies the need for appropriate texture modifications, mastication, and bite sizes when consuming solids  Identifies appropriate multivitamin and calcium sources post-operatively  Describes the need for physical activity post-operatively and will follow MD recommendations  States when to call healthcare provider regarding medication questions or post-operative complications  Handouts given during class include:  Phase 3: Soft, High Protein Diet  Phase 3 Meal Ideas  Follow-Up Plan: Patient will follow-up at [facility] in 6 weeks for 2 month post-op nutrition visit for diet advancement per MD.

## 2019-05-18 ENCOUNTER — Other Ambulatory Visit: Payer: Self-pay | Admitting: Family Medicine

## 2019-05-18 ENCOUNTER — Telehealth: Payer: Self-pay | Admitting: Dietician

## 2019-05-18 DIAGNOSIS — F32A Depression, unspecified: Secondary | ICD-10-CM

## 2019-05-18 DIAGNOSIS — F329 Major depressive disorder, single episode, unspecified: Secondary | ICD-10-CM

## 2019-05-18 DIAGNOSIS — F419 Anxiety disorder, unspecified: Secondary | ICD-10-CM

## 2019-05-18 MED FILL — ARIPIPRAZOLE 2 MG TABS: 2 | 30 days supply | Qty: 30 | Fill #2

## 2019-05-18 MED FILL — clonazePAM 0.5 MG TABS: 0.5 | 15 days supply | Qty: 30 | Fill #2

## 2019-05-18 MED FILL — BUSPIRONE HCL 7.5 MG TABS: 7.5 | 90 days supply | Qty: 180 | Fill #0

## 2019-05-18 MED FILL — traZODone HCL 50 MG TABS: 50 | 30 days supply | Qty: 30 | Fill #2

## 2019-05-18 NOTE — Telephone Encounter (Signed)
I spoke with patient via telephone to assess fluid intake and food tolerance since diet advancement to solid protein foods on 05/13/2019.  Surgery Date: 04/28/2019 Surgery Type: Sleeve Gastrectomy   Daily fluid intake: 64 ounces Daily protein intake: 60 grams  Patient states she has had no issues tolerating foods or fluids. States protein drinks taste differently to her now so she is keeping up with options she likes. No questions or concerns per patient.     Nat Christen Kaanapali) Short, MS, RD, LDN

## 2019-05-22 MED FILL — IBUPROFEN 600 MG TABLET: 600 | 10 days supply | Qty: 30 | Fill #0

## 2019-05-25 MED FILL — PIOGLITAZONE HCL 30 MG TAB: 30 | 30 days supply | Qty: 30 | Fill #1

## 2019-06-11 MED FILL — clonazePAM 0.5 MG TABS: 0.5 | 15 days supply | Qty: 30 | Fill #3

## 2019-06-11 MED FILL — PIOGLITAZONE HCL 30 MG TAB: 30 | 30 days supply | Qty: 30 | Fill #1

## 2019-06-22 ENCOUNTER — Other Ambulatory Visit: Payer: Self-pay | Admitting: Family Medicine

## 2019-06-22 DIAGNOSIS — E669 Obesity, unspecified: Secondary | ICD-10-CM

## 2019-06-22 DIAGNOSIS — E1169 Type 2 diabetes mellitus with other specified complication: Secondary | ICD-10-CM

## 2019-06-22 MED FILL — ARIPIPRAZOLE 2 MG TABS: 2 | 30 days supply | Qty: 30 | Fill #3

## 2019-06-22 MED FILL — traZODone HCL 50 MG TABS: 50 | 30 days supply | Qty: 30 | Fill #3

## 2019-06-22 MED FILL — IBUPROFEN 600 MG TABLET: 600 | 10 days supply | Qty: 30 | Fill #1

## 2019-06-22 MED FILL — JANUVIA 100 MG TABLET: 100 | 90 days supply | Qty: 90 | Fill #0

## 2019-06-24 ENCOUNTER — Other Ambulatory Visit: Payer: Self-pay

## 2019-06-24 ENCOUNTER — Encounter: Payer: Self-pay | Admitting: Family Medicine

## 2019-06-24 ENCOUNTER — Encounter: Payer: 59 | Attending: General Surgery | Admitting: Skilled Nursing Facility1

## 2019-06-24 DIAGNOSIS — E669 Obesity, unspecified: Secondary | ICD-10-CM | POA: Diagnosis not present

## 2019-06-24 DIAGNOSIS — E1169 Type 2 diabetes mellitus with other specified complication: Secondary | ICD-10-CM

## 2019-06-24 NOTE — Progress Notes (Signed)
Bariatric Nutrition Follow-Up Visit Medical Nutrition Therapy  Appt Start Time: 7:37 End Time: 8:00  2 Months Post-Operative sleeve Surgery Surgery Date: 04/28/2019  Pt's Expectations of Surgery/ Goals:   NUTRITION ASSESSMENT    Anthropometrics  Start weight at [facility]: 242.5 lbs (date: 04/28/2019) Today's weight: 214.6 lbs Weight change:  lbs (since previous nutrition visit)  Body Composition Scale 05/13/2019 06/24/2019  Weight (lbs) 222.6 214.6  BMI 42.1 40.6  Total Body Fat % 44 43     Visceral Fat 14 13  Fat-Free Mass % 55.9 56.9     Total Body Water % 42.4 42.9     Muscle-Mass (lbs) 29.7 29.6  Body Fat Displacement ---          Torso  (lbs) 60.6 57.2         Left Leg  (lbs) 12.1 11.4         Right Leg  (lbs) 12.1 11.4         Left Arm  (lbs) 6 5.7         Right Arm  (lbs) 6 5.7   Clinical  Medical hx: DM, anxiety/depression, anemia Medications: januvia Labs: Blood sugars: bedtime 99-110   Lifestyle & Dietary Hx (including living situation, sleep regimen, functional ability, weight hx, current dietary patterns, fluid intake, supplements, physical activity, etc)  Pt states her depression kicked up last week (crying spells) so she thinks she needs to speak with her doctor about changing her mental health medication dosage.   Estimated daily fluid intake: 64+ oz Estimated daily protein intake: 60+ g Supplements: celebrate and calcium  Current average weekly physical activity: walking inconsistently   24-Hr Dietary Recall First Meal: boiled egg Snack 10-11: protein shake Second Meal: burger patty Snack:  Third Meal: protein shake  Snack:  Beverages: water with flavorings   Post-Op Goals/ Signs/ Symptoms Using straws: no Drinking while eating: no Chewing/swallowing difficulties: no Changes in vision: no Changes to mood/headaches: no Hair loss/changes to skin/nails: no Difficulty focusing/concentrating: no Sweating: no Dizziness/lightheadedness:  no Palpitations: no  Carbonated/caffeinated beverages: no N/V/D/C/Gas: no Abdominal pain: no Dumping syndrome: no    NUTRITION DIAGNOSIS  Overweight/obesity ([place]-3.3) related to past poor dietary habits and physical inactivity as evidenced by completed bariatric surgery and following dietary guidelines for continued weight loss and healthy nutrition status.     NUTRITION INTERVENTION Nutrition counseling (C-1) and education (E-2) to facilitate bariatric surgery goals, including: . Diet advancement to the next phase (phase non starchy vegetabkes)  . The importance of consuming adequate calories as well as certain nutrients daily due to the body's need for essential vitamins, minerals, and fats . The importance of daily physical activity and to reach a goal of at least 150 minutes of moderate to vigorous physical activity weekly (or as directed by their physician) due to benefits such as increased musculature and improved lab values  Handouts Provided Include   Non starchy vegetables  Learning Style & Readiness for Change Teaching method utilized: Visual & Auditory  Demonstrated degree of understanding via: Teach Back  Barriers to learning/adherence to lifestyle change: none identified   RD's Notes for Next Visit . Goals: -Continue to aim for a minimum of 64 fluid ounces 7 days a week with at least 30 ounces being plain water -Eat non-starchy vegetables 2 times a day 7 days a week -Start out with soft cooked vegetables today and tomorrow; if tolerated begin to eat raw vegetables or cooked including salads -Eat your 3 ounces of protein  first then start in on your non-starchy vegetables; once you understand how much of your meal leads to satisfaction and not full while still eating 3 ounces of protein and non-starchy vegetables you can eat them in any order  -Continue to aim for 30 minutes of activity at least 5 times a week -Do NOT cook with/add to your food: alfredo sauce, cheese  sauce, barbeque sauce, ketchup, fat back, butter, bacon grease, grease, Crisco  -Try alkaline water pH 8.8   MONITORING & EVALUATION Dietary intake, weekly physical activity, body weight  Next Steps Patient is to follow-up in 3 months

## 2019-06-24 NOTE — Patient Instructions (Addendum)
-  Continue to aim for a minimum of 64 fluid ounces 7 days a week with at least 30 ounces being plain water  -Eat non-starchy vegetables 2 times a day 7 days a week  -Start out with soft cooked vegetables today and tomorrow; if tolerated begin to eat raw vegetables or cooked including salads  -Eat your 3 ounces of protein first then start in on your non-starchy vegetables; once you understand how much of your meal leads to satisfaction and not full while still eating 3 ounces of protein and non-starchy vegetables you can eat them in any order   -Continue to aim for 30 minutes of activity at least 5 times a week  -Do NOT cook with/add to your food: alfredo sauce, cheese sauce, barbeque sauce, ketchup, fat back, butter, bacon grease, grease, Crisco   -Try alkaline water pH 8.8

## 2019-07-21 ENCOUNTER — Other Ambulatory Visit: Payer: Self-pay | Admitting: Family Medicine

## 2019-07-21 DIAGNOSIS — Z634 Disappearance and death of family member: Secondary | ICD-10-CM

## 2019-07-22 ENCOUNTER — Other Ambulatory Visit: Payer: Self-pay | Admitting: Family Medicine

## 2019-07-22 DIAGNOSIS — Z634 Disappearance and death of family member: Secondary | ICD-10-CM

## 2019-07-22 MED FILL — ARIPIPRAZOLE 2 MG TABS: 2 | 30 days supply | Qty: 30 | Fill #0

## 2019-07-22 MED FILL — traZODone HCL 50 MG TABS: 50 | 30 days supply | Qty: 30 | Fill #0

## 2019-07-22 MED FILL — clonazePAM 0.5 MG TABS: 0.5 | 15 days supply | Qty: 30 | Fill #0

## 2019-07-22 NOTE — Telephone Encounter (Signed)
Requesting:  Clonazepam and Trazodone Contract:     None [facility]DS:     None Last Visit:    04/17/2019 Next Visit:   none Last Refill:    Clonazepam---#30 with 3 refills on 03/04/2019                       Trazodone----#30 with 3 refills on 03/03/2019  Please Advise

## 2019-07-24 ENCOUNTER — Other Ambulatory Visit: Payer: Self-pay | Admitting: Family Medicine

## 2019-07-28 ENCOUNTER — Encounter: Payer: Self-pay | Admitting: Family Medicine

## 2019-07-29 ENCOUNTER — Encounter: Payer: Self-pay | Admitting: Family Medicine

## 2019-07-29 ENCOUNTER — Other Ambulatory Visit: Payer: Self-pay

## 2019-07-29 ENCOUNTER — Ambulatory Visit (INDEPENDENT_AMBULATORY_CARE_PROVIDER_SITE_OTHER): Payer: 59 | Admitting: Family Medicine

## 2019-07-29 DIAGNOSIS — F419 Anxiety disorder, unspecified: Secondary | ICD-10-CM | POA: Diagnosis not present

## 2019-07-29 DIAGNOSIS — F329 Major depressive disorder, single episode, unspecified: Secondary | ICD-10-CM | POA: Diagnosis not present

## 2019-07-29 DIAGNOSIS — E1169 Type 2 diabetes mellitus with other specified complication: Secondary | ICD-10-CM

## 2019-07-29 DIAGNOSIS — F32A Depression, unspecified: Secondary | ICD-10-CM

## 2019-07-29 DIAGNOSIS — E785 Hyperlipidemia, unspecified: Secondary | ICD-10-CM

## 2019-07-29 MED ORDER — BUSPIRONE HCL 10 MG PO TABS: 10.0000 mg | ORAL_TABLET | Freq: Two times a day (BID) | ORAL | 2 refills | Status: DC

## 2019-07-29 MED FILL — busPIRone HCL 10 MG TABS: 10 | 30 days supply | Qty: 60 | Fill #0

## 2019-07-29 NOTE — Progress Notes (Signed)
CC: Anxiety  Subjective Martha Shannon presents for f/u anxiety/depression. Due to COVID-19 pandemic, we are interacting via web portal for an electronic face-to-face visit. I verified patient's ID using 2 identifiers. Patient agreed to proceed with visit via this method. Patient is at home, I am at office. Patient and I are present for visit.   She is currently being treated with Zoloft 100 mg/d, BuSpar 7.5 mg bid, Klonopin prn, trazodone prn.  Reports overall improvement since treatment, but things have been getting worse and worse lately. Reports many social stressors such as stress at work, family deaths, etc. No thoughts of harming self or others. No self-medication with alcohol, prescription drugs or illicit drugs. Pt is not following with a counselor/psychologist.  Pt w hx of DM. Last A1c was 6.8, recently had bariatric surg and lost 30 lbs. No hypoglycemia. Sugars running in 90's/low 100's.  She is currently taking Januvia and Actos.  Reports compliance, no hypoglycemia.  ROS Psych: No homicidal or suicidal thoughts Endo: No wt gain  Past Medical History:  Diagnosis Date  . Anxiety and depression 09/24/2016  . Asthma     controlled   . Chicken pox    As a child.  . Depression   . Diabetes mellitus type 2 in obese ([place]) 09/24/2016  . Fatty liver   . Kidney stones   . Migraines   . PONV (postoperative nausea and vomiting)    vomiting with epidural during childbirth      Exam No conversational dyspnea Age appropriate judgment and insight Nml affect and mood  Assessment and Plan  Anxiety and depression - Plan: busPIRone (BUSPAR) 10 MG tablet, Ambulatory referral to Psychiatry  Type 2 diabetes mellitus with hyperlipidemia ([place]) - Plan: CBC, Comp Met (CMET), HgB A1c, Urine Microalbumin w/creat. ratio, Lipid Profile  1-refer to psychiatry.  Will send referral recommendations and counseling recommendations in her my chart.  Increase dosage of BuSpar to 10 mg twice  daily.  Continue Zoloft 100 mg daily and Klonopin as needed.  Recommended exercise. 2-very pleased with her fasting sugars and weight loss due to bariatric surgery.  We will check an A1c and stop her Actos.  We may take her off of the Januvia as well. F/u in 4 weeks. The patient voiced understanding and agreement to the plan.  [place], DO 07/29/19 4:20 PM

## 2019-08-01 ENCOUNTER — Ambulatory Visit (INDEPENDENT_AMBULATORY_CARE_PROVIDER_SITE_OTHER): Payer: 59 | Admitting: Psychiatry

## 2019-08-01 ENCOUNTER — Other Ambulatory Visit: Payer: Self-pay

## 2019-08-01 ENCOUNTER — Encounter (HOSPITAL_COMMUNITY): Payer: Self-pay | Admitting: Psychiatry

## 2019-08-01 VITALS — Wt 209.0 lb

## 2019-08-01 DIAGNOSIS — F331 Major depressive disorder, recurrent, moderate: Secondary | ICD-10-CM | POA: Diagnosis not present

## 2019-08-01 DIAGNOSIS — F419 Anxiety disorder, unspecified: Secondary | ICD-10-CM | POA: Diagnosis not present

## 2019-08-01 MED ORDER — LAMOTRIGINE 25 MG PO TABS: ORAL_TABLET | ORAL | 1 refills | Status: DC
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
## 2019-08-03 ENCOUNTER — Other Ambulatory Visit: Payer: Self-pay | Admitting: Family Medicine

## 2019-08-03 MED FILL — lamoTRIgine 25 MG TABS: 25 | 30 days supply | Qty: 60 | Fill #0

## 2019-08-04 MED ORDER — AMMONIUM LACTATE 12 % EX LOTN: 1.0000 "application " | TOPICAL_LOTION | CUTANEOUS | 1 refills | Status: DC | PRN

## 2019-08-04 MED FILL — AMMONIUM LACTATE 12% LOTION: 12 | 30 days supply | Qty: 226 | Fill #0

## 2019-08-04 MED FILL — SERTRALINE HCL 100 MG TABS: 100 | 90 days supply | Qty: 90 | Fill #1

## 2019-08-12 ENCOUNTER — Other Ambulatory Visit (INDEPENDENT_AMBULATORY_CARE_PROVIDER_SITE_OTHER): Payer: 59

## 2019-08-12 ENCOUNTER — Other Ambulatory Visit: Payer: Self-pay

## 2019-08-12 DIAGNOSIS — E1169 Type 2 diabetes mellitus with other specified complication: Secondary | ICD-10-CM | POA: Diagnosis not present

## 2019-08-12 DIAGNOSIS — E785 Hyperlipidemia, unspecified: Secondary | ICD-10-CM | POA: Diagnosis not present

## 2019-08-12 LAB — CBC
HCT: 34.5 % — ABNORMAL LOW (ref 36.0–46.0)
Hemoglobin: 11 g/dL — ABNORMAL LOW (ref 12.0–15.0)
MCHC: 31.9 g/dL (ref 30.0–36.0)
MCV: 72.7 fl — ABNORMAL LOW (ref 78.0–100.0)
Platelets: 379 10*3/uL (ref 150.0–400.0)
RBC: 4.75 Mil/uL (ref 3.87–5.11)
RDW: 18.9 % — ABNORMAL HIGH (ref 11.5–15.5)
WBC: 6.9 10*3/uL (ref 4.0–10.5)

## 2019-08-12 LAB — COMPREHENSIVE METABOLIC PANEL
ALT: 21 U/L (ref 0–35)
AST: 16 U/L (ref 0–37)
Albumin: 4 g/dL (ref 3.5–5.2)
Alkaline Phosphatase: 46 U/L (ref 39–117)
BUN: 10 mg/dL (ref 6–23)
CO2: 28 mEq/L (ref 19–32)
Calcium: 9.3 mg/dL (ref 8.4–10.5)
Chloride: 103 mEq/L (ref 96–112)
Creatinine, Ser: 0.69 mg/dL (ref 0.40–1.20)
GFR: 94.55 mL/min (ref >60.00)
Glucose, Bld: 120 mg/dL — ABNORMAL HIGH (ref 70–99)
Potassium: 3.9 mEq/L (ref 3.5–5.1)
Sodium: 139 mEq/L (ref 135–145)
Total Bilirubin: 0.3 mg/dL (ref 0.2–1.2)
Total Protein: 6.5 g/dL (ref 6.0–8.3)

## 2019-08-12 LAB — LIPID PANEL
Cholesterol: 202 mg/dL — ABNORMAL HIGH (ref 0–200)
HDL: 42.3 mg/dL (ref >39.00)
LDL Cholesterol: 144 mg/dL — ABNORMAL HIGH (ref 0–99)
NonHDL: 160.16
Total CHOL/HDL Ratio: 5
Triglycerides: 83 mg/dL (ref 0.0–149.0)
VLDL: 16.6 mg/dL (ref 0.0–40.0)

## 2019-08-12 LAB — MICROALBUMIN / CREATININE URINE RATIO
Creatinine,U: 193.5 mg/dL
Microalb Creat Ratio: 0.9 mg/g (ref 0.0–30.0)
Microalb, Ur: 1.7 mg/dL (ref 0.0–1.9)

## 2019-08-12 LAB — HEMOGLOBIN A1C: Hgb A1c MFr Bld: 6.3 % (ref 4.6–6.5)

## 2019-08-17 ENCOUNTER — Other Ambulatory Visit: Payer: Self-pay

## 2019-08-17 ENCOUNTER — Ambulatory Visit (INDEPENDENT_AMBULATORY_CARE_PROVIDER_SITE_OTHER): Payer: 59 | Admitting: Licensed Clinical Social Worker

## 2019-08-17 DIAGNOSIS — F331 Major depressive disorder, recurrent, moderate: Secondary | ICD-10-CM | POA: Diagnosis not present

## 2019-08-17 DIAGNOSIS — F411 Generalized anxiety disorder: Secondary | ICD-10-CM | POA: Diagnosis not present
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
## 2019-08-25 MED FILL — ARIPIPRAZOLE 2 MG TABS: 2 | 30 days supply | Qty: 30 | Fill #1

## 2019-08-25 MED FILL — traZODone HCL 50 MG TABS: 50 | 30 days supply | Qty: 30 | Fill #1

## 2019-08-25 MED FILL — clonazePAM 0.5 MG TABS: 0.5 | 15 days supply | Qty: 30 | Fill #1

## 2019-09-04 MED FILL — lamoTRIgine 25 MG TABS: 25 | 30 days supply | Qty: 60 | Fill #1

## 2019-09-08 ENCOUNTER — Encounter (HOSPITAL_COMMUNITY): Payer: Self-pay | Admitting: Psychiatry

## 2019-09-08 ENCOUNTER — Ambulatory Visit (INDEPENDENT_AMBULATORY_CARE_PROVIDER_SITE_OTHER): Payer: 59 | Admitting: Psychiatry

## 2019-09-08 ENCOUNTER — Other Ambulatory Visit: Payer: Self-pay

## 2019-09-08 DIAGNOSIS — F331 Major depressive disorder, recurrent, moderate: Secondary | ICD-10-CM

## 2019-09-08 DIAGNOSIS — F419 Anxiety disorder, unspecified: Secondary | ICD-10-CM

## 2019-09-08 MED ORDER — LAMOTRIGINE 100 MG PO TABS: 100.0000 mg | ORAL_TABLET | Freq: Every day | ORAL | 1 refills | Status: DC

## 2019-09-08 MED FILL — LAMOTRIGINE 100 MG TABS: 100 | 30 days supply | Qty: 30 | Fill #0

## 2019-09-08 NOTE — Progress Notes (Signed)
Virtual Visit via Video Note  I connected with Martha Shannon on 09/08/19 at  1:00 PM EST by a video enabled telemedicine application and verified that I am speaking with the correct person using two identifiers.   I discussed the limitations of evaluation and management by telemedicine and the availability of in person appointments. The patient expressed understanding and agreed to proceed.  History of Present Illness: Patient was evaluated through WebEx.  She is a 39 year old employed female who is referred from PCP.  She is struggled with anxiety and depression and tried medicine from PCP but lately her symptoms are not getting better.  She had gastric sleeve surgery and since then her emotions are absent.  She has highs and lows, irritability.  We started her on Lamictal to help her depression.  She noticed some improvement as she is not as tearful and she is more hopeful.  She denies any feeling of hopelessness or worthlessness but is still having ruminative and negative thoughts.  She started therapy with Georgina Snell.  She is tolerating Lamictal without any side effects.  She has no rash, itching, tremors or shakes.  She also taking trazodone as she tried to stop but she could not sleep.  She also taking Klonopin 0.5 mg as needed twice a day and Zoloft 100 mg daily.  She is concerned about her father who is in [facility] home and recently diagnosed with Covid.  She talked to her father last Thursday.  She still have vivid dreams about her best friend's son died many years ago and motor vehicle accident.  However she feels talking to therapist had help these dreams.  Her level is slowly improving.  She denies any suicidal thoughts or homicidal thought.  The job is sometimes very stressful.  [facility]  Patient lives with her husband and 88 year old daughter.  Patient is willing to try higher dose of Lamictal.  Patient denies drinking or using any illegal substances.   Patient recently had blood work.  Past Psychiatric History: H/O depression anxiety since age 55.  No h/o inpatient treatment, suicidal attempt, psychosis, abuse.  PCP tried Lexapro that stop working after 1 year, Abilify make mood worst. Buspar did not work.     Recent Results (from the past 2160 hour(s))  Lipid Profile     Status: Abnormal   Collection Time: 08/12/19  7:42 AM  Result Value Ref Range   Cholesterol 202 (H) 0 - 200 mg/dL    Comment: ATP III Classification       Desirable:  < 200 mg/dL               Borderline High:  200 - 239 mg/dL          High:  > = 240 mg/dL   Triglycerides 83.0 0.0 - 149.0 mg/dL    Comment: Normal:  <150 mg/dLBorderline High:  150 - 199 mg/dL   HDL 42.30 >39.00 mg/dL   VLDL 16.6 0.0 - 40.0 mg/dL   LDL Cholesterol 144 (H) 0 - 99 mg/dL   Total CHOL/HDL Ratio 5     Comment:                Men          Women1/2 Average Risk     3.4          3.3Average Risk          5.0  4.42X Average Risk          9.6          7.13X Average Risk          15.0          11.0                       NonHDL 160.16     Comment: NOTE:  Non-HDL goal should be 30 mg/dL higher than patient's LDL goal (i.e. LDL goal of < 70 mg/dL, would have non-HDL goal of < 100 mg/dL)  Urine Microalbumin w/creat. ratio     Status: None   Collection Time: 08/12/19  7:42 AM  Result Value Ref Range   Microalb, Ur 1.7 0.0 - 1.9 mg/dL   Creatinine,U 193.5 mg/dL   Microalb Creat Ratio 0.9 0.0 - 30.0 mg/g  HgB A1c     Status: None   Collection Time: 08/12/19  7:42 AM  Result Value Ref Range   Hgb A1c MFr Bld 6.3 4.6 - 6.5 %    Comment: Glycemic Control Guidelines for People with Diabetes:Non Diabetic:  <6%Goal of Therapy: <7%Additional Action Suggested:  >8%   Comp Met (CMET)     Status: Abnormal   Collection Time: 08/12/19  7:42 AM  Result Value Ref Range   Sodium 139 135 - 145 mEq/L   Potassium 3.9 3.5 - 5.1 mEq/L   Chloride 103 96 - 112 mEq/L   CO2 28 19 - 32 mEq/L   Glucose, Bld 120  (H) 70 - 99 mg/dL   BUN 10 6 - 23 mg/dL   Creatinine, Ser 0.69 0.40 - 1.20 mg/dL   Total Bilirubin 0.3 0.2 - 1.2 mg/dL   Alkaline Phosphatase 46 39 - 117 U/L   AST 16 0 - 37 U/L   ALT 21 0 - 35 U/L   Total Protein 6.5 6.0 - 8.3 g/dL   Albumin 4.0 3.5 - 5.2 g/dL   GFR 94.55 >60.00 mL/min   Calcium 9.3 8.4 - 10.5 mg/dL  CBC     Status: Abnormal   Collection Time: 08/12/19  7:42 AM  Result Value Ref Range   WBC 6.9 4.0 - 10.5 K/uL   RBC 4.75 3.87 - 5.11 Mil/uL   Platelets 379.0 150.0 - 400.0 K/uL   Hemoglobin 11.0 (L) 12.0 - 15.0 g/dL   HCT 34.5 (L) 36.0 - 46.0 %   MCV 72.7 (L) 78.0 - 100.0 fl   MCHC 31.9 30.0 - 36.0 g/dL   RDW 18.9 (H) 11.5 - 15.5 %     Psychiatric Specialty Exam: Physical Exam  Review of Systems  There were no vitals taken for this visit.There is no height or weight on file to calculate BMI.  General Appearance: Casual  Eye Contact:  Good  Speech:  Clear and Coherent and Slow  Volume:  Normal  Mood:  Anxious and Dysphoric  Affect:  Congruent  Thought Process:  Goal Directed  Orientation:  Full (Time, Place, and Person)  Thought Content:  Rumination  Suicidal Thoughts:  No  Homicidal Thoughts:  No  Memory:  Immediate;   Good Recent;   Good Remote;   Good  Judgement:  Intact  Insight:  Present  Psychomotor Activity:  Normal  Concentration:  Concentration: Good and Attention Span: Good  Recall:  Good  Fund of Knowledge:  Fair  Language:  Good  Akathisia:  No  Handed:  Right  AIMS (if indicated):  Assets:  Communication Skills Desire for [place] Talents/Skills Transportation  ADL's:  Intact  Cognition:  WNL  Sleep:   ok with trazadone      Assessment and Plan: Major depressive disorder, recurrent.  Anxiety.  I reviewed her blood work results.  Her hemoglobin A1c is 6.3.  Her CBC and chemistries is also reviewed.  So far patient is tolerating Lamictal without any side effects.  I recommend to try  Lamictal 75 mg for 2 weeks and then 100 mg daily.  She is no longer taking BuSpar.  Recommend to take Klonopin 0.5 mg only as needed for severe anxiety.  Continue trazodone 50 mg at bedtime and Zoloft 100 mg daily.  Patient has another refill for Klonopin, trazodone and Zoloft prescribed by PCP.  Encouraged to continue therapy with Georgina Snell.  Recommended to call us back if she is any question or any concern.  Time spent 25 minutes.  More than 50% of the time spent in psychoeducation, counseling, coronation of care and reviewing her chart.  Follow-up in 2 months.  Follow Up Instructions:    I discussed the assessment and treatment plan with the patient. The patient was provided an opportunity to ask questions and all were answered. The patient agreed with the plan and demonstrated an understanding of the instructions.   The patient was advised to call back or seek an in-person evaluation if the symptoms worsen or if the condition fails to improve as anticipated.  I provided 25 minutes of non-face-to-face time during this encounter.   Kathlee Nations, MD

## 2019-09-15 MED FILL — busPIRone HCL 10 MG TABS: 10 | 30 days supply | Qty: 60 | Fill #1

## 2019-09-15 MED FILL — clonazePAM 0.5 MG TABS: 0.5 | 15 days supply | Qty: 30 | Fill #2

## 2019-09-22 ENCOUNTER — Ambulatory Visit: Payer: 59 | Admitting: Skilled Nursing Facility1

## 2019-09-25 MED FILL — traZODone HCL 50 MG TABS: 50 | 30 days supply | Qty: 30 | Fill #2

## 2019-09-28 MED FILL — clonazePAM 0.5 MG TABS: 0.5 | 15 days supply | Qty: 30 | Fill #3

## 2019-09-29 ENCOUNTER — Ambulatory Visit: Payer: Self-pay | Admitting: Skilled Nursing Facility1

## 2019-10-27 ENCOUNTER — Ambulatory Visit (HOSPITAL_COMMUNITY): Payer: Self-pay | Admitting: Psychiatry

## 2019-10-30 ENCOUNTER — Encounter: Payer: Self-pay | Admitting: Family Medicine

## 2019-10-30 MED ORDER — ONDANSETRON 4 MG PO TBDP: 4.0000 mg | ORAL_TABLET | Freq: Three times a day (TID) | ORAL | 0 refills | Status: DC | PRN

## 2019-10-30 MED FILL — LAMOTRIGINE 100 MG TABS: 100 | 30 days supply | Qty: 30 | Fill #1

## 2019-11-02 MED FILL — ONDANSETRON ODT 4 MG TABLET: 4 | 7 days supply | Qty: 20 | Fill #0

## 2019-11-09 MED FILL — SERTRALINE HCL 100 MG TABS: 100 | 90 days supply | Qty: 90 | Fill #2

## 2019-11-09 MED FILL — traZODone HCL 50 MG TABS: 50 | 30 days supply | Qty: 30 | Fill #3

## 2019-11-09 MED FILL — busPIRone HCL 7.5 MG TABS: 7.5 | 90 days supply | Qty: 180 | Fill #1

## 2019-11-18 ENCOUNTER — Encounter: Payer: Self-pay | Admitting: Family Medicine

## 2019-11-18 ENCOUNTER — Telehealth: Payer: Self-pay | Admitting: Family

## 2019-11-18 DIAGNOSIS — M5441 Lumbago with sciatica, right side: Secondary | ICD-10-CM

## 2019-11-18 MED ORDER — BACLOFEN 10 MG PO TABS: 10.0000 mg | ORAL_TABLET | Freq: Three times a day (TID) | ORAL | 0 refills | Status: DC

## 2019-11-18 MED ORDER — GABAPENTIN 100 MG PO CAPS: 100.0000 mg | ORAL_CAPSULE | Freq: Three times a day (TID) | ORAL | 3 refills | Status: DC

## 2019-11-18 MED FILL — BACLOFEN 10 MG TABS: 10 | 10 days supply | Qty: 30 | Fill #0

## 2019-11-18 MED FILL — GABAPENTIN 100 MG CAPSULE: 100 | 30 days supply | Qty: 90 | Fill #0

## 2019-11-18 NOTE — Telephone Encounter (Signed)
Spoke with patient.  She is unable to come in today or get off, she does work for [employer]  I did offer her the option of doing an E-Visit thru her mychart which they do address back pain.  She will try that.  If that does not work then to call to get scheduled.

## 2019-11-18 NOTE — Progress Notes (Signed)
We are sorry that you are not feeling well.  Here is how we plan to help!  Based on what you have shared with me it looks like you mostly have acute back pain.  Acute back pain is defined as musculoskeletal pain that can resolve in 1-3 weeks with conservative treatment.  I have prescribed Gabapentin 100 mg that you can take three times a day for pain as well as Baclofen 10 mg every eight hours as needed which is a muscle relaxer. You can continue the motrin.   Some patients experience stomach irritation or in increased heartburn with anti-inflammatory drugs.  Please keep in mind that muscle relaxer's can cause fatigue and should not be taken while at work or driving.  Back pain is very common.  The pain often gets better over time.  The cause of back pain is usually not dangerous.  Most people can learn to manage their back pain on their own.  Home Care  Stay active.  Start with short walks on flat ground if you can.  Try to walk farther each day.  Do not sit, drive or stand in one place for more than 30 minutes.  Do not stay in bed.  Do not avoid exercise or work.  Activity can help your back heal faster.  Be careful when you bend or lift an object.  Bend at your knees, keep the object close to you, and do not twist.  Sleep on a firm mattress.  Lie on your side, and bend your knees.  If you lie on your back, put a pillow under your knees.  Only take medicines as told by your doctor.  Put ice on the injured area.  Put ice in a plastic bag  Place a towel between your skin and the bag  Leave the ice on for 15-20 minutes, 3-4 times a day for the first 2-3 days. 210 After that, you can switch between ice and heat packs.  Ask your doctor about back exercises or massage.  Avoid feeling anxious or stressed.  Find good ways to deal with stress, such as exercise.  Get Help Right Way If:  Your pain does not go away with rest or medicine.  Your pain does not go away in 1 week.  You have  new problems.  You do not feel well.  The pain spreads into your legs.  You cannot control when you poop (bowel movement) or pee (urinate)  You feel sick to your stomach (nauseous) or throw up (vomit)  You have belly (abdominal) pain.  You feel like you may pass out (faint).  If you develop a fever.  Make Sure you:  Understand these instructions.  Will watch your condition  Will get help right away if you are not doing well or get worse.  Your e-visit answers were reviewed by a board certified advanced clinical practitioner to complete your personal care plan.  Depending on the condition, your plan could have included both over the counter or prescription medications.  If there is a problem please reply  once you have received a response from your provider.  Your safety is important to [place].  If you have drug allergies check your prescription carefully.    You can use MyChart to ask questions about today's visit, request a non-urgent call back, or ask for a work or school excuse for 24 hours related to this e-Visit. If it has been greater than 24 hours you will need to follow up  with your provider, or enter a new e-Visit to address those concerns.  You will get an e-mail in the next two days asking about your experience.  I hope that your e-visit has been valuable and will speed your recovery. Thank you for using e-visits.  Approximately 5 minutes was spent documenting and reviewing patient's chart.

## 2019-11-30 ENCOUNTER — Other Ambulatory Visit (HOSPITAL_COMMUNITY): Payer: Self-pay | Admitting: Psychiatry

## 2019-11-30 ENCOUNTER — Other Ambulatory Visit: Payer: Self-pay | Admitting: Family Medicine

## 2019-11-30 DIAGNOSIS — F331 Major depressive disorder, recurrent, moderate: Secondary | ICD-10-CM

## 2019-11-30 DIAGNOSIS — F419 Anxiety disorder, unspecified: Secondary | ICD-10-CM

## 2019-11-30 MED FILL — busPIRone HCL 10 MG TABS: 10 | 30 days supply | Qty: 60 | Fill #2

## 2019-11-30 MED FILL — clonazePAM 0.5 MG TABS: 0.5 | 15 days supply | Qty: 30 | Fill #0

## 2019-11-30 NOTE — Telephone Encounter (Signed)
Refill Request of clonazepam Last OV---  07/29/2019 No upcoming appointment is scheduled. Last Refill----  #30 with 3 refills on 07/22/2019 No UDS///No CSC

## 2019-12-05 ENCOUNTER — Ambulatory Visit (INDEPENDENT_AMBULATORY_CARE_PROVIDER_SITE_OTHER): Payer: Self-pay | Admitting: Psychiatry

## 2019-12-05 ENCOUNTER — Other Ambulatory Visit: Payer: Self-pay

## 2019-12-05 ENCOUNTER — Encounter (HOSPITAL_COMMUNITY): Payer: Self-pay | Admitting: Psychiatry

## 2019-12-05 DIAGNOSIS — F331 Major depressive disorder, recurrent, moderate: Secondary | ICD-10-CM

## 2019-12-05 DIAGNOSIS — F419 Anxiety disorder, unspecified: Secondary | ICD-10-CM

## 2019-12-05 MED ORDER — LAMOTRIGINE 150 MG PO TABS: 150.0000 mg | ORAL_TABLET | Freq: Every day | ORAL | 0 refills | Status: DC

## 2019-12-05 NOTE — Progress Notes (Signed)
Virtual Visit via Telephone Note  I connected with Martha Shannon on 12/05/19 at  8:00 AM EDT by telephone and verified that I am speaking with the correct person using two identifiers.   I discussed the limitations, risks, security and privacy concerns of performing an evaluation and management service by telephone and the availability of in person appointments. I also discussed with the patient that there may be a patient responsible charge related to this service. The patient expressed understanding and agreed to proceed.   History of Present Illness: Patient was evaluated through phone session.  She is taking the Lamictal 100 mg which we increased on the last dose.  She noticed improvement in her mood depression and anxiety.  She has no longer crying spells or depressive thoughts but is still gets sometimes irritable and feeling overwhelmed.  Patient told her father got second Covid infection but now he is doing better.  Father is a nursing home in [place].  She reported her job is stressful but she is handling better.  So far she is tolerating Lamictal and noticed improvement in her mood.  She has no rash, itching, tremors or shakes.  Her biggest concern is sleep issues.  She is taking trazodone and she tried up to 100 mg but it make her tired and sometimes giving her vivid dreams.  Recently she hurt her back and PCP given her gabapentin and baclofen which she now stopped as pain is better.  She takes Klonopin in the morning along with Zoloft prescribed by PCP to help with anxiety.  She is happy that she lost weight and she is no longer taking any diabetes medication.  She lives with her husband and 88 year old daughter.  She was at medical record and [facility].  She does not want to add more medication but open to try higher dose of Lamictal to help her residual mood lability.  Past Psychiatric History: H/O depression anxiety since age 39. No h/o inpatient treatment, suicidal attempt,  psychosis, abuse. PCP tried Lexapro that stop working after 1 year, Abilify make mood worst. Buspar did not work.   Psychiatric Specialty Exam: Physical Exam  Review of Systems  Weight 190 lb (86.2 kg).There is no height or weight on file to calculate BMI.  General Appearance: NA  Eye Contact:  NA  Speech:  Clear and Coherent and Normal Rate  Volume:  Normal  Mood:  Dysphoric  Affect:  NA  Thought Process:  Goal Directed  Orientation:  Full (Time, Place, and Person)  Thought Content:  Rumination  Suicidal Thoughts:  No  Homicidal Thoughts:  No  Memory:  Immediate;   Good Recent;   Good Remote;   Good  Judgement:  Intact  Insight:  Present  Psychomotor Activity:  NA  Concentration:  Concentration: Good and Attention Span: Good  Recall:  Good  Fund of Knowledge:  Good  Language:  Good  Akathisia:  No  Handed:  Right  AIMS (if indicated):     Assets:  Communication Skills Desire for Improvement Housing Resilience Social Support Talents/Skills Transportation  ADL's:  Intact  Cognition:  WNL  Sleep:   fair      Assessment and Plan: Major depressive disorder, recurrent.  Anxiety.  Patient doing better on Lamictal.  She still have residual mood lability.  I recommend to try Lamictal 150 mg since she does not have any side effects.  She does not want to try a different medication for sleep.  She still takes  trazodone up to 100 mg when needed even though it make her tired.  She does not take trazodone every night so it usually helps when she does not have work next day.  Her Klonopin, trazodone is prescribed by PCP.  She has still refill remaining.  We will try Lamictal 150 mg daily.  She has not seen Denyse Amass but like to reschedule appointment.  Recommended to call us back if she has any question or any concern.  Follow-up in 3 months.  Follow Up Instructions:    I discussed the assessment and treatment plan with the patient. The patient was provided an opportunity to ask  questions and all were answered. The patient agreed with the plan and demonstrated an understanding of the instructions.   The patient was advised to call back or seek an in-person evaluation if the symptoms worsen or if the condition fails to improve as anticipated.  I provided 20 minutes of non-face-to-face time during this encounter.   Cleotis Nipper, MD

## 2019-12-07 MED FILL — lamoTRIgine 150 MG TABS: 150 | 90 days supply | Qty: 90 | Fill #0

## 2020-01-05 ENCOUNTER — Other Ambulatory Visit: Payer: Self-pay | Admitting: Family Medicine

## 2020-01-06 ENCOUNTER — Other Ambulatory Visit: Payer: Self-pay | Admitting: Internal Medicine

## 2020-01-06 NOTE — Telephone Encounter (Signed)
In the absence of PCP. Last OV---07/29/2019 Last RF---07/22/2019---#30 with 3 refills

## 2020-01-06 NOTE — Telephone Encounter (Signed)
Chart reviewed, prescription sent

## 2020-01-21 ENCOUNTER — Telehealth (HOSPITAL_COMMUNITY): Payer: Self-pay | Admitting: *Deleted

## 2020-01-21 ENCOUNTER — Other Ambulatory Visit: Payer: Self-pay

## 2020-01-21 DIAGNOSIS — M5441 Lumbago with sciatica, right side: Secondary | ICD-10-CM

## 2020-01-21 NOTE — Telephone Encounter (Signed)
Pt called requesting to try something other than Trazodone for sleep. C/o that is not effective. Pt has upcoming appointment on 03/01/20. Please review and advise.

## 2020-01-21 NOTE — Telephone Encounter (Signed)
Opened in error

## 2020-01-22 ENCOUNTER — Telehealth (HOSPITAL_COMMUNITY): Payer: Self-pay | Admitting: *Deleted

## 2020-01-22 ENCOUNTER — Other Ambulatory Visit: Payer: Self-pay | Admitting: Family Medicine

## 2020-01-22 MED ORDER — QUETIAPINE FUMARATE 25 MG PO TABS: 25.0000 mg | ORAL_TABLET | Freq: Every day | ORAL | 3 refills | Status: DC

## 2020-01-22 MED FILL — QUETIAPINE 25 MG TABLET: 25 | 30 days supply | Qty: 30 | Fill #0

## 2020-01-22 MED FILL — clonazePAM 0.5 MG TABS: 0.5 | 15 days supply | Qty: 30 | Fill #2

## 2020-01-22 NOTE — Telephone Encounter (Signed)
Writer spoke with pt regarding her asking for something other than Trazodone for sleep and shared that Dr. Raelene Bott OTC Melatonin 3-6 mg. Pt states that she doesn't feel like her medication regime in general is working. Pt describes her mood as "all over the place" with increased agitation, anger, and irritability. FYI.

## 2020-01-22 NOTE — Telephone Encounter (Signed)
She is not getting trazodone from [place].  She can try over-the-counter melatonin 3 to 6 mg for insomnia.

## 2020-01-26 ENCOUNTER — Other Ambulatory Visit (HOSPITAL_COMMUNITY): Payer: Self-pay | Admitting: *Deleted

## 2020-01-26 ENCOUNTER — Telehealth (HOSPITAL_COMMUNITY): Payer: Self-pay | Admitting: *Deleted

## 2020-01-26 MED ORDER — MIRTAZAPINE 15 MG PO TABS
15.0000 mg | ORAL_TABLET | Freq: Every day | ORAL | 1 refills | Status: DC
Start: 2020-01-26 — End: 2020-03-01

## 2020-01-26 MED FILL — MIRTAZAPINE 15 MG TABLET: 15 | 30 days supply | Qty: 30 | Fill #0

## 2020-01-26 NOTE — Telephone Encounter (Signed)
She is given klonopin and trazodone by PCP. If she stopped Trazodone since not working than she can try Remeron 15 mg at bed time to help mood and insomnia. If she agree than please call her pharmacy.

## 2020-01-26 NOTE — Telephone Encounter (Signed)
Writer spoke with pt about starting Remeron 15 mg HS for sleep. Pt agrees and states she is not sleeping well despite the Seroquel 25mg  provided by PCP. Med ed initiated. Rx sent to [facility] per pt request.

## 2020-02-24 MED FILL — traZODone HCL 50 MG TABS: 50 | 30 days supply | Qty: 30 | Fill #1

## 2020-02-24 MED FILL — clonazePAM 0.5 MG TABS: 0.5 | 15 days supply | Qty: 30 | Fill #3

## 2020-02-24 MED FILL — GABAPENTIN 100 MG CAPSULE: 100 | 30 days supply | Qty: 90 | Fill #2

## 2020-02-24 MED FILL — MIRTAZAPINE 15 MG TABLET: 15 | 30 days supply | Qty: 30 | Fill #1

## 2020-02-24 MED FILL — QUETIAPINE 25 MG TABLET: 25 | 30 days supply | Qty: 30 | Fill #1

## 2020-03-01 ENCOUNTER — Telehealth (INDEPENDENT_AMBULATORY_CARE_PROVIDER_SITE_OTHER): Payer: Self-pay | Admitting: Psychiatry

## 2020-03-01 ENCOUNTER — Other Ambulatory Visit: Payer: Self-pay

## 2020-03-01 ENCOUNTER — Other Ambulatory Visit (HOSPITAL_COMMUNITY): Payer: Self-pay | Admitting: Psychiatry

## 2020-03-01 ENCOUNTER — Encounter (HOSPITAL_COMMUNITY): Payer: Self-pay | Admitting: Psychiatry

## 2020-03-01 VITALS — Wt 190.0 lb

## 2020-03-01 DIAGNOSIS — F331 Major depressive disorder, recurrent, moderate: Secondary | ICD-10-CM

## 2020-03-01 DIAGNOSIS — F419 Anxiety disorder, unspecified: Secondary | ICD-10-CM

## 2020-03-01 MED ORDER — LAMOTRIGINE 150 MG PO TABS: 150.0000 mg | ORAL_TABLET | Freq: Every morning | ORAL | 0 refills | Status: DC

## 2020-03-01 MED ORDER — SERTRALINE HCL 100 MG PO TABS: 100.0000 mg | ORAL_TABLET | Freq: Every morning | ORAL | 0 refills | Status: DC

## 2020-03-01 MED ORDER — QUETIAPINE FUMARATE 50 MG PO TABS: 50.0000 mg | ORAL_TABLET | Freq: Every day | ORAL | 2 refills | Status: DC

## 2020-03-01 MED FILL — SERTRALINE HCL 100 MG TABS: 100 | 90 days supply | Qty: 90 | Fill #0

## 2020-03-01 MED FILL — QUETIAPINE FUMARATE 50 MG T: 50 | 30 days supply | Qty: 30 | Fill #0

## 2020-03-01 MED FILL — lamoTRIgine 150 MG TABS: 150 | 90 days supply | Qty: 90 | Fill #0

## 2020-03-01 NOTE — Progress Notes (Signed)
Virtual Visit via Telephone Note  I connected with Martha Shannon on 03/01/20 at  1:20 PM EDT by telephone and verified that I am speaking with the correct person using two identifiers.  Location: Patient: work Provider: home office   I discussed the limitations, risks, security and privacy concerns of performing an evaluation and management service by telephone and the availability of in person appointments. I also discussed with the patient that there may be a patient responsible charge related to this service. The patient expressed understanding and agreed to proceed.   History of Present Illness: Patient is evaluated by phone session.  She is still struggling with insomnia.  She had tried melatonin, trazodone, recently tried Remeron and PCP provided Seroquel.  Despite taking all these medication she had trouble sleeping all night.  She also reported sleeping during the day.  She is no longer taking gabapentin and baclofen.  She is taking Klonopin prescribed by PCP which she takes when she has a lot of anxiety.  She is disappointed because her weight loss is not happening anymore and she is stable on 190 pounds.  She lives with her husband and 45 year old daughter.  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  Her job is going well.  She has no rash, itching tremors or shakes.  She denies any depression or mood swings since she feels Lamictal helping but she is concerned about insomnia.    Past Psychiatric History: H/Odepression anxiety since age 55.No h/oinpatient treatment, suicidal attempt, psychosis, abuse. PCP tried Lexapro that stop working after 1 year, Abilify make mood worst.Buspar did not work.    Psychiatric Specialty Exam: Physical Exam  Review of Systems  Weight 190 lb (86.2 kg).There is no height or weight on file to calculate BMI.  General Appearance: NA  Eye Contact:  NA  Speech:  Clear and Coherent  Volume:  Normal  Mood:  Anxious and Irritable   Affect:  NA  Thought Process:  Goal Directed  Orientation:  Full (Time, Place, and Person)  Thought Content:  Rumination  Suicidal Thoughts:  No  Homicidal Thoughts:  No  Memory:  Immediate;   Good Recent;   Good Remote;   Good  Judgement:  Intact  Insight:  Present  Psychomotor Activity:  NA  Concentration:  Concentration: Fair and Attention Span: Fair  Recall:  Good  Fund of Knowledge:  Good  Language:  Good  Akathisia:  No  Handed:  Right  AIMS (if indicated):     Assets:  Communication Skills Desire for Improvement Housing Resilience Social Support  ADL's:  Intact  Cognition:  WNL  Sleep:   fair      Assessment and Plan: Major depressive disorder, recurrent.  Anxiety.  I reviewed her medication.  Most of her psychotropic medication given by her PCP.  Upon questioning she informed that she is taking Lamictal and Zoloft at night.  I recommend she should be taking Zoloft and Lamictal in the morning because it could be causing insomnia.  She told her PCP never mentioned that she has to take Zoloft during the day.  I also suggested that she should take psych medicine from one provider to avoid confusion.  She agreed with the plan.  We will continue Zoloft 100 mg but she will take in the morning, she will continue Lamictal 150 mg in the morning and I recommend to try Seroquel 50 mg at bedtime and she can take half to 1 tablet as she can  tolerate.  She will use the Klonopin only for severe anxiety and she has enough refills.  I will discontinue mirtazapine since it did not help the insomnia.  She has no rash, itching tremors or shakes.  Recommended to call us back if she has any question or any concern.  She has not seen Denyse Amass in a while.  Follow-up in 3 months.  Follow Up Instructions:    I discussed the assessment and treatment plan with the patient. The patient was provided an opportunity to ask questions and all were answered. The patient agreed with the plan and  demonstrated an understanding of the instructions.   The patient was advised to call back or seek an in-person evaluation if the symptoms worsen or if the condition fails to improve as anticipated.  I provided 20 minutes of non-face-to-face time during this encounter.   Cleotis Nipper, MD

## 2020-03-24 ENCOUNTER — Telehealth: Payer: Self-pay | Admitting: Emergency Medicine

## 2020-03-24 DIAGNOSIS — M542 Cervicalgia: Secondary | ICD-10-CM

## 2020-03-24 MED ORDER — CYCLOBENZAPRINE HCL 10 MG PO TABS
10.0000 mg | ORAL_TABLET | Freq: Three times a day (TID) | ORAL | 0 refills | Status: DC | PRN
Start: 2020-03-24 — End: 2020-11-17

## 2020-03-24 MED FILL — CYCLOBENZAPRINE HCL 10 MG T: 10 | 10 days supply | Qty: 30 | Fill #0

## 2020-03-24 NOTE — Progress Notes (Signed)
Addendum in response to patient's MyChart message:  Mayme Genta,   I have reviewed your chart. In the past you have been seen for back pain and been prescribed a muscle relaxer.  I have sent a prescription for this in case this helps with your neck discomfort.  Again, please seek medical attention as soon as possible to rule out other causes of your neck pain. I hope you are able to do this soon.  Go to the emergency room or urgent care immediately if you have worsening neck pain with stiffness or rigidity, fever, headache, swelling, numbness or weakness to your extremities, swelling or pain in your throat, shortness of breath.   Sharen Heck, PA-C [facility]

## 2020-03-24 NOTE — Addendum Note (Signed)
Addended by: Liberty Handy on: 03/24/2020 10:21 AM   Modules accepted: Orders

## 2020-03-24 NOTE — Progress Notes (Signed)
Time spent 5 minutes  Patient used incorrect e-visit survey. Reporting neck pain persistent despite several reasonable OTC medicines including tylenol, gabapentin, motrin, ice.  "hard to move neck". Recommending face to face evaluation.   Based on what you shared with me, I feel your condition warrants further evaluation and I recommend that you be seen for a face to face visit.  Neck pain can be from many different causes and some can be serious.  Additionally, uncomplicated muscular neck pain would improve slightly with the treatments you have tried.  Because of these reasons, I think it is best that you contact your primary care physician practice to be seen. Many offices offer virtual options to be seen via video if you are not comfortable going in person to a medical facility at this time.   If you do not have a PCP, [facility] offers a free physician referral service available at [phone]. Our trained staff has the experience, knowledge and resources to put you in touch with a physician who is right for you.   You also have the option of a video visit through [email or web address]  If you are having a true medical emergency please call 911.  NOTE: If you entered your credit card information for this eVisit, you will not be charged. You may see a "hold" on your card for the $35 but that hold will drop off and you will not have a charge processed.  Your e-visit answers were reviewed by a board certified advanced clinical practitioner to complete your personal care plan.  Thank you for using e-Visits.

## 2020-03-28 ENCOUNTER — Ambulatory Visit (INDEPENDENT_AMBULATORY_CARE_PROVIDER_SITE_OTHER): Payer: Self-pay | Admitting: Medical

## 2020-03-28 ENCOUNTER — Other Ambulatory Visit: Payer: Self-pay

## 2020-03-28 ENCOUNTER — Encounter: Payer: Self-pay | Admitting: Medical

## 2020-03-28 VITALS — BP 143/70 | HR 62 | Temp 97.4°F | Resp 18 | Ht 61.0 in | Wt 196.0 lb

## 2020-03-28 DIAGNOSIS — M542 Cervicalgia: Secondary | ICD-10-CM

## 2020-03-28 MED ORDER — KETOROLAC TROMETHAMINE 60 MG/2ML IM SOLN
60.0000 mg | Freq: Once | INTRAMUSCULAR | Status: AC
  Administered 2020-03-28: 60 mg via INTRAMUSCULAR

## 2020-03-28 NOTE — Progress Notes (Signed)
Subjective:    Patient ID: Martha Shannon, female    DOB: [birth year], 39 y.o.   MRN: [id number]  HPI  Pt in for neck pain for over 3 weeks. Pain initially just woke in morning and felt pain. Since then pain is just not easing up. Pt had some gabapentin for back pain and seems not to help neck pain. She had gabapentin 100 mg tid. Just started this again.  She also uses flexeril but has not helped even if she used 3 times a day which she tried over the weekend.  lmp- one week ago.  Pt has tried ibuprofen 600 mg every 8 hours with tylenol but did not help much.     Review of Systems  Constitutional: Negative for chills, fatigue and fever.  Respiratory: Negative for chest tightness, shortness of breath and wheezing.   Cardiovascular: Negative for chest pain and palpitations.  Gastrointestinal: Negative for abdominal pain, constipation and vomiting.  Musculoskeletal: Negative for back pain and neck pain.  Skin: Negative for rash.  Neurological: Negative for dizziness, speech difficulty, weakness and headaches.  Hematological: Negative for adenopathy. Does not bruise/bleed easily.  Psychiatric/Behavioral: Negative for behavioral problems, confusion, hallucinations and sleep disturbance. The patient is not nervous/anxious.     Past Medical History:  Diagnosis Date  . Anxiety and depression 09/24/2016  . Asthma     controlled   . Chicken pox    As a child.  . Depression   . Diabetes mellitus type 2 in obese (HCC) 09/24/2016  . Fatty liver   . Kidney stones   . Migraines   . PONV (postoperative nausea and vomiting)    vomiting with epidural during childbirth      Social History   Socioeconomic History  . Marital status: Married    Spouse name: Not on file  . Number of children: Not on file  . Years of education: Not on file  . Highest education level: Not on file  Occupational History  . Not on file  Tobacco Use  . Smoking status: Never Smoker  . Smokeless tobacco:  Never Used  Substance and Sexual Activity  . Alcohol use: Not Currently  . Drug use: No  . Sexual activity: Yes    Birth control/protection: None    Comment: 1ST INTERCOURSE- 14, PARTNERS -1  Other Topics Concern  . Not on file  Social History Narrative  . Not on file   Social Determinants of Health   Financial Resource Strain:   . Difficulty of Paying Living Expenses:   Food Insecurity:   . Worried About Programme researcher, broadcasting/film/video in the Last Year:   . Barista in the Last Year:   Transportation Needs:   . Freight forwarder (Medical):   Marland Kitchen Lack of Transportation (Non-Medical):   Physical Activity:   . Days of Exercise per Week:   . Minutes of Exercise per Session:   Stress:   . Feeling of Stress :   Social Connections:   . Frequency of Communication with Friends and Family:   . Frequency of Social Gatherings with Friends and Family:   . Attends Religious Services:   . Active Member of Clubs or Organizations:   . Attends Banker Meetings:   Marland Kitchen Marital Status:   Intimate Partner Violence:   . Fear of Current or Ex-Partner:   . Emotionally Abused:   Marland Kitchen Physically Abused:   . Sexually Abused:     Past Surgical History:  Procedure Laterality Date  . CESAREAN SECTION     2006.  Marland Kitchen LAPAROSCOPIC GASTRIC SLEEVE RESECTION N/A 04/28/2019   Procedure: LAPAROSCOPIC GASTRIC SLEEVE RESECTION, Upper Endo, ERAS Pathway;  Surgeon: Kinsinger, De Blanch, MD;  Location: [facility];  Service: General;  Laterality: N/A;    Family History  Problem Relation Age of Onset  . Diabetes Mother   . Diabetes Father   . Hypertension Father   . Heart disease Father   . Diabetes Sister   . Alzheimer's disease Paternal Grandmother   . Alzheimer's disease Paternal Grandfather     Allergies  Allergen Reactions  . Naproxen     Legs felt agitated, and restleness     Current Outpatient Medications on File Prior to Visit  Medication Sig Dispense Refill  . albuterol (PROVENTIL  HFA;VENTOLIN HFA) 108 (90 Base) MCG/ACT inhaler Inhale 2 puffs into the lungs every 4 (four) hours as needed for wheezing or shortness of breath. 18 g 5  . ammonium lactate (LAC-HYDRIN) 12 % lotion Apply 1 application topically as needed for dry skin. 400 g 1  . clonazePAM (KLONOPIN) 0.5 MG tablet TAKE 1 TABLET (0.5 MG TOTAL) BY MOUTH 2 (TWO) TIMES DAILY AS NEEDED FOR ANXIETY. 30 tablet 3  . cyclobenzaprine (FLEXERIL) 10 MG tablet Take 1 tablet (10 mg total) by mouth 3 (three) times daily as needed for muscle spasms. 30 tablet 0  . lamoTRIgine (LAMICTAL) 150 MG tablet Take 1 tablet (150 mg total) by mouth in the morning. 90 tablet 0  . QUEtiapine (SEROQUEL) 50 MG tablet Take 1 tablet (50 mg total) by mouth at bedtime. 30 tablet 2  . sertraline (ZOLOFT) 100 MG tablet Take 1 tablet (100 mg total) by mouth in the morning. 90 tablet 0  . ACCU-CHEK FASTCLIX LANCETS MISC USE AS DIRECTED ONCE DAILY TO CHECK BLOOD SUGAR. 102 each 6  . ACCU-CHEK GUIDE test strip USE AS DIRECTED ONCE DAILY TO CHECK BLOOD SUGAR. 100 each 6   No current facility-administered medications on file prior to visit.    BP (!) 143/70   Pulse 62   Temp (!) 97.4 F (36.3 C) (Oral)   Resp 18   Ht 5\' 1"  (1.549 m)   Wt 196 lb (88.9 kg)   BMI 37.03 kg/m       Objective:   Physical Exam   General Mental Status- Alert. General Appearance- Not in acute distress.   Skin General: Color- Normal Color. Moisture- Normal Moisture.  Neck Carotid Arteries- Normal color. Moisture- Normal Moisture. No carotid bruits. No JVD. Mild mid cspine tenderness to palpation and left trap tenderness.  Chest and Lung Exam Auscultation: Breath Sounds:-Normal.  Cardiovascular Auscultation:Rythm- Regular. Murmurs & Other Heart Sounds:Auscultation of the heart reveals- No Murmurs.  Abdomen Inspection:-Inspeection Normal. Palpation/Percussion:Note:No mass. Palpation and Percussion of the abdomen reveal- Non Tender, Non Distended + BS, no  rebound or guarding.   Neurologic Cranial Nerve exam:- CN III-XII intact(No nystagmus), symmetric smile. Strength:- 5/5 equal and symmetric strength both upper and lower extremities.     Assessment & Plan:  For neck pain will give you toradol 60 mg im. Want to see what your xray shows.  Can continue flexeril and gabapentin.  Will decide on rx nsaid or taper prednisone after xray.  May go ahead and refer you to sports med after xray review and assessing response to treatment.  , PA-C   Time spent with patient today was  25 minutes which consisted of chart review, discussing diagnosis,  work up treatment and documentation.

## 2020-03-28 NOTE — Patient Instructions (Addendum)
For neck pain will give you toradol 60 mg im. Want to see what your xray shows.  Can continue flexeril and gabapentin.  Will decide on rx nsaid or taper prednisone after xray.  May go ahead and refer you to sports med after xray review and assessing response to treatment.

## 2020-03-28 NOTE — Addendum Note (Signed)
Addended by: Steve Rattler A on: 03/28/2020 01:54 PM   Modules accepted: Orders

## 2020-04-27 MED FILL — QUETIAPINE FUMARATE 50 MG T: 50 | 30 days supply | Qty: 30 | Fill #1

## 2020-05-05 ENCOUNTER — Other Ambulatory Visit: Payer: Self-pay | Admitting: Family Medicine

## 2020-05-05 MED FILL — traZODone HCL 50 MG TABS: 50 | 30 days supply | Qty: 30 | Fill #2

## 2020-05-05 MED FILL — QUETIAPINE FUMARATE 50 MG T: 50 | 30 days supply | Qty: 30 | Fill #1

## 2020-05-05 MED FILL — GABAPENTIN 100 MG CAPSULE: 100 | 30 days supply | Qty: 90 | Fill #3

## 2020-05-05 NOTE — Telephone Encounter (Signed)
Requesting: klonpnin Contract:n/a UDS:n/a Last Visit:03/28/20 Next Visit:n/a Last Refill:11/30/19  Please Advise

## 2020-05-09 ENCOUNTER — Encounter (HOSPITAL_COMMUNITY): Payer: Self-pay | Admitting: Psychiatry

## 2020-05-09 ENCOUNTER — Other Ambulatory Visit: Payer: Self-pay

## 2020-05-09 ENCOUNTER — Ambulatory Visit (INDEPENDENT_AMBULATORY_CARE_PROVIDER_SITE_OTHER): Payer: Self-pay | Admitting: Psychiatry

## 2020-05-09 VITALS — Wt 195.0 lb

## 2020-05-09 DIAGNOSIS — F331 Major depressive disorder, recurrent, moderate: Secondary | ICD-10-CM

## 2020-05-09 DIAGNOSIS — F419 Anxiety disorder, unspecified: Secondary | ICD-10-CM

## 2020-05-09 MED ORDER — BUPROPION HCL ER (XL) 150 MG PO TB24: 150.0000 mg | ORAL_TABLET | Freq: Every day | ORAL | 0 refills | Status: DC

## 2020-05-09 MED FILL — buPROPion HCL ER (XL) 150 M: 150 | 30 days supply | Qty: 30 | Fill #0
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
## 2020-05-10 ENCOUNTER — Ambulatory Visit (INDEPENDENT_AMBULATORY_CARE_PROVIDER_SITE_OTHER): Payer: Self-pay | Admitting: Clinical

## 2020-05-10 ENCOUNTER — Other Ambulatory Visit: Payer: Self-pay

## 2020-05-10 DIAGNOSIS — F331 Major depressive disorder, recurrent, moderate: Secondary | ICD-10-CM
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
## 2020-05-12 ENCOUNTER — Other Ambulatory Visit: Payer: Self-pay | Admitting: Family Medicine

## 2020-05-12 NOTE — Telephone Encounter (Signed)
Last written: 11/30/19 Last ov: 07/29/19 Next ov: none Contract: none UDS: none

## 2020-05-13 MED ORDER — ONDANSETRON 4 MG PO TBDP
4.0000 mg | ORAL_TABLET | Freq: Three times a day (TID) | ORAL | 0 refills | Status: DC | PRN
Start: 2020-05-13 — End: 2020-07-08

## 2020-05-13 MED FILL — ONDANSETRON ODT 4 MG TABLET: 4 | 6 days supply | Qty: 20 | Fill #0

## 2020-05-13 NOTE — Telephone Encounter (Signed)
Pt is due for CPE. Please schedule. I sent in more Zofran. Ty.

## 2020-06-01 ENCOUNTER — Encounter (HOSPITAL_COMMUNITY): Payer: Self-pay | Admitting: Psychiatry

## 2020-06-01 ENCOUNTER — Telehealth (INDEPENDENT_AMBULATORY_CARE_PROVIDER_SITE_OTHER): Payer: Self-pay | Admitting: Psychiatry

## 2020-06-01 ENCOUNTER — Other Ambulatory Visit: Payer: Self-pay

## 2020-06-01 DIAGNOSIS — F419 Anxiety disorder, unspecified: Secondary | ICD-10-CM

## 2020-06-01 DIAGNOSIS — F331 Major depressive disorder, recurrent, moderate: Secondary | ICD-10-CM

## 2020-06-01 MED ORDER — BUPROPION HCL ER (XL) 300 MG PO TB24: 300.0000 mg | ORAL_TABLET | Freq: Every day | ORAL | 1 refills | Status: DC

## 2020-06-01 MED ORDER — LAMOTRIGINE 150 MG PO TABS: 150.0000 mg | ORAL_TABLET | Freq: Every morning | ORAL | 0 refills | Status: DC
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
## 2020-06-06 ENCOUNTER — Telehealth (HOSPITAL_COMMUNITY): Payer: Self-pay | Admitting: *Deleted

## 2020-06-06 NOTE — Telephone Encounter (Signed)
Referral sent to [facility] for r/o ADD.

## 2020-06-07 ENCOUNTER — Other Ambulatory Visit (HOSPITAL_BASED_OUTPATIENT_CLINIC_OR_DEPARTMENT_OTHER): Payer: Self-pay | Admitting: Psychiatry

## 2020-06-07 MED FILL — buPROPion HCL ER (XL) 300 M: 300 | 30 days supply | Qty: 30 | Fill #0

## 2020-06-14 ENCOUNTER — Other Ambulatory Visit (HOSPITAL_BASED_OUTPATIENT_CLINIC_OR_DEPARTMENT_OTHER): Payer: Self-pay

## 2020-06-14 MED FILL — IBUPROFEN 800 MG TAB: 800 | 5 days supply | Qty: 15 | Fill #0

## 2020-06-14 MED FILL — CHLORHEXIDINE 0.12% RINSE: 0.12 | 30 days supply | Qty: 473 | Fill #0

## 2020-06-14 MED FILL — ACETAMINOPHEN-COD #3 TABLET: 300-30 | 2 days supply | Qty: 15 | Fill #0

## 2020-06-21 ENCOUNTER — Other Ambulatory Visit (HOSPITAL_BASED_OUTPATIENT_CLINIC_OR_DEPARTMENT_OTHER): Payer: Self-pay

## 2020-06-21 MED FILL — FLUARIX QUADRIVALENT 0.5 ML: 0.5 | 1 days supply | Qty: 1 | Fill #0

## 2020-06-21 MED FILL — IBUPROFEN 800 MG TAB: 800 | 5 days supply | Qty: 15 | Fill #0

## 2020-06-21 MED FILL — AMOXICILLIN 500 MG CAPSULE: 500 | 6 days supply | Qty: 25 | Fill #0

## 2020-06-21 MED FILL — ACETAMINOPHEN-COD #3 TABLET: 300-30 | 3 days supply | Qty: 15 | Fill #0

## 2020-06-24 ENCOUNTER — Other Ambulatory Visit (HOSPITAL_BASED_OUTPATIENT_CLINIC_OR_DEPARTMENT_OTHER): Payer: Self-pay

## 2020-06-24 MED FILL — AMOX-CLAV 875-125 MG TABLET: 875-125 | 8 days supply | Qty: 25 | Fill #0

## 2020-06-27 MED FILL — IBUPROFEN 800 MG TAB: 800 | 5 days supply | Qty: 15 | Fill #0

## 2020-07-04 ENCOUNTER — Encounter: Payer: Self-pay | Admitting: Family Medicine

## 2020-07-08 ENCOUNTER — Other Ambulatory Visit: Payer: Self-pay | Admitting: Family Medicine

## 2020-07-08 ENCOUNTER — Other Ambulatory Visit: Payer: Self-pay

## 2020-07-08 MED ORDER — ONDANSETRON 4 MG PO TBDP: 4.0000 mg | ORAL_TABLET | Freq: Three times a day (TID) | ORAL | 0 refills | Status: DC | PRN

## 2020-07-08 MED FILL — ONDANSETRON ODT 4 MG TABLET: 4 | 7 days supply | Qty: 20 | Fill #0

## 2020-07-11 ENCOUNTER — Other Ambulatory Visit: Payer: Self-pay | Admitting: Family

## 2020-07-11 ENCOUNTER — Other Ambulatory Visit (HOSPITAL_COMMUNITY): Payer: Self-pay | Admitting: Psychiatry

## 2020-07-11 DIAGNOSIS — M5441 Lumbago with sciatica, right side: Secondary | ICD-10-CM

## 2020-07-11 DIAGNOSIS — F331 Major depressive disorder, recurrent, moderate: Secondary | ICD-10-CM

## 2020-07-11 DIAGNOSIS — F419 Anxiety disorder, unspecified: Secondary | ICD-10-CM

## 2020-07-11 MED FILL — QUETIAPINE FUMARATE 50 MG T: 50 | 30 days supply | Qty: 30 | Fill #2

## 2020-07-11 MED FILL — traZODone HCL 50 MG TABS: 50 | 30 days supply | Qty: 30 | Fill #3

## 2020-07-11 MED FILL — buPROPion HCL ER (XL) 300 M: 300 | 30 days supply | Qty: 30 | Fill #1

## 2020-07-12 ENCOUNTER — Encounter: Payer: Self-pay | Admitting: Family Medicine

## 2020-07-12 ENCOUNTER — Telehealth (INDEPENDENT_AMBULATORY_CARE_PROVIDER_SITE_OTHER): Payer: No Typology Code available for payment source | Admitting: Psychiatry

## 2020-07-12 ENCOUNTER — Other Ambulatory Visit: Payer: Self-pay

## 2020-07-12 ENCOUNTER — Other Ambulatory Visit (HOSPITAL_COMMUNITY): Payer: Self-pay | Admitting: Psychiatry

## 2020-07-12 ENCOUNTER — Encounter (HOSPITAL_COMMUNITY): Payer: Self-pay | Admitting: Psychiatry

## 2020-07-12 DIAGNOSIS — F419 Anxiety disorder, unspecified: Secondary | ICD-10-CM | POA: Diagnosis not present

## 2020-07-12 DIAGNOSIS — F331 Major depressive disorder, recurrent, moderate: Secondary | ICD-10-CM | POA: Diagnosis not present

## 2020-07-12 MED ORDER — LORAZEPAM 0.5 MG PO TABS: 0.5000 mg | ORAL_TABLET | Freq: Two times a day (BID) | ORAL | 1 refills | Status: DC

## 2020-07-12 MED ORDER — LAMOTRIGINE 150 MG PO TABS: 150.0000 mg | ORAL_TABLET | Freq: Every morning | ORAL | 0 refills | Status: DC

## 2020-07-12 MED ORDER — BUPROPION HCL ER (XL) 300 MG PO TB24: 300.0000 mg | ORAL_TABLET | Freq: Every day | ORAL | 0 refills | Status: DC

## 2020-07-12 MED FILL — LORazepam 0.5 MG TABS: 0.5 | 30 days supply | Qty: 60 | Fill #0

## 2020-07-12 MED FILL — LAMOTRIGINE 150 MG TABS: 150 | 90 days supply | Qty: 90 | Fill #0

## 2020-07-12 NOTE — Progress Notes (Signed)
Virtual Visit via Telephone Note  I connected with [facility] on 07/12/20 at  1:40 PM EST by telephone and verified that I am speaking with the correct person using two identifiers.  Location: Patient: Work Provider: Home Work   I discussed the limitations, risks, security and privacy concerns of performing an evaluation and management service by telephone and the availability of in person appointments. I also discussed with the patient that there may be a patient responsible charge related to this service. The patient expressed understanding and agreed to proceed.   History of Present Illness: Patient is evaluated by phone session.  She is taking Wellbutrin and we have increased the dose of Wellbutrin on the last visit to help her residual depression and helping her focus and attention.  However she noticed feeling more anxious nervous.  She is getting more emotional and sometimes she does not describe what is the reason.  She is taking Klonopin to keep her anxiety under control but during the day it is difficult.  She does not want to change the medication because she feels it helps the depression and anger but she continued to feel anxious.  We have recommended psychological testing to rule out ADD but patient has been very busy and not able to schedule appointment.  She has no tremors, shakes or any EPS.  She lost weight as she is trying to lose weight and she feels the medicine helping and she also trying to watch her calorie intake.  She likes Wellbutrin better than Zoloft and like to keep it.  She has no rash, itching tremors or shakes.  Past Psychiatric History: H/Odepression anxiety since age 39.No h/oinpatient treatment, suicidal attempt, psychosis, abuse. PCP tried Lexapro that stop working after 1 year, Abilify make mood worst.Buspar did not work, Seroquel and Zoloft did not work.   Psychiatric Specialty Exam: Physical Exam  Review of Systems  Weight 185 lb (83.9  kg).There is no height or weight on file to calculate BMI.  General Appearance: NA  Eye Contact:  NA  Speech:  Normal Rate  Volume:  Normal  Mood:  Irritable  Affect:  NA  Thought Process:  Goal Directed  Orientation:  Full (Time, Place, and Person)  Thought Content:  Rumination  Suicidal Thoughts:  No  Homicidal Thoughts:  No  Memory:  Immediate;   Good Recent;   Good Remote;   Good  Judgement:  Intact  Insight:  Present  Psychomotor Activity:  NA  Concentration:  Concentration: Fair and Attention Span: Fair  Recall:  Good  Fund of Knowledge:  Good  Language:  Good  Akathisia:  No  Handed:  Right  AIMS (if indicated):     Assets:  Communication Skills Desire for Improvement Housing Resilience Social Support Talents/Skills Transportation  ADL's:  Intact  Cognition:  WNL  Sleep:         Assessment and Plan: Major depressive disorder, recurrent.  Anxiety.  Discuss her anxiety.  Recommend to discontinue Klonopin and try lorazepam 0.5 mg to take twice a day to help with anxiety.  She feels her anxiety is more during the day.  In the past she had tried Seroquel and Zoloft and Lexapro but she felt that Wellbutrin helped the most.  She does not want to change the medication.  I encouraged that she should see a coping skills and she agreed with the plan.  She will contact her PCP however if she cannot find a therapist appointment and she will contact  us.  Continue Wellbutrin XL 300 mg daily, Lamictal 150 mg daily and she will try lorazepam 0.5 mg twice a day.  Discontinue Klonopin.  Follow-up in 6 weeks.  Follow Up Instructions:    I discussed the assessment and treatment plan with the patient. The patient was provided an opportunity to ask questions and all were answered. The patient agreed with the plan and demonstrated an understanding of the instructions.   The patient was advised to call back or seek an in-person evaluation if the symptoms worsen or if the condition  fails to improve as anticipated.  I provided 18 minutes of non-face-to-face time during this encounter.   Cleotis Nipper, MD

## 2020-07-14 ENCOUNTER — Telehealth (HOSPITAL_COMMUNITY): Payer: Self-pay | Admitting: *Deleted

## 2020-07-14 NOTE — Telephone Encounter (Signed)
Pt called c/o the Ativan being too sedating. States that she can't take it at work and needs an alternative. Pt did not mention Klonopin. Vistaril? Please advise.

## 2020-07-14 NOTE — Telephone Encounter (Signed)
She can try Xanax 0.25 mg to take as needed.  If she agree please call to her local pharmacy [id number].  She did not discontinue Ativan.  She was taking Klonopin which we discontinued as it did not help.

## 2020-07-15 ENCOUNTER — Other Ambulatory Visit (HOSPITAL_COMMUNITY): Payer: Self-pay | Admitting: *Deleted

## 2020-07-15 MED ORDER — ALPRAZOLAM 0.25 MG PO TABS: 0.2500 mg | ORAL_TABLET | Freq: Every day | ORAL | 0 refills | Status: DC | PRN

## 2020-07-15 MED FILL — ALPRAZolam 0.25 MG TABS: 0.25 | 30 days supply | Qty: 30 | Fill #0

## 2020-07-20 ENCOUNTER — Encounter: Payer: Self-pay | Admitting: Family Medicine

## 2020-07-27 ENCOUNTER — Other Ambulatory Visit: Payer: Self-pay | Admitting: Family Medicine

## 2020-07-27 ENCOUNTER — Other Ambulatory Visit (HOSPITAL_COMMUNITY): Payer: Self-pay | Admitting: Psychiatry

## 2020-07-27 DIAGNOSIS — F32A Depression, unspecified: Secondary | ICD-10-CM

## 2020-07-27 DIAGNOSIS — F419 Anxiety disorder, unspecified: Secondary | ICD-10-CM

## 2020-07-27 DIAGNOSIS — F331 Major depressive disorder, recurrent, moderate: Secondary | ICD-10-CM

## 2020-07-27 MED FILL — SERTRALINE HCL 100 MG TABS: 100 | 90 days supply | Qty: 90 | Fill #0

## 2020-08-25 ENCOUNTER — Encounter (HOSPITAL_COMMUNITY): Payer: Self-pay | Admitting: Psychiatry

## 2020-08-25 ENCOUNTER — Other Ambulatory Visit (HOSPITAL_COMMUNITY): Payer: Self-pay | Admitting: Psychiatry

## 2020-08-25 ENCOUNTER — Telehealth (INDEPENDENT_AMBULATORY_CARE_PROVIDER_SITE_OTHER): Payer: No Typology Code available for payment source | Admitting: Psychiatry

## 2020-08-25 ENCOUNTER — Other Ambulatory Visit: Payer: Self-pay

## 2020-08-25 DIAGNOSIS — F419 Anxiety disorder, unspecified: Secondary | ICD-10-CM

## 2020-08-25 DIAGNOSIS — F331 Major depressive disorder, recurrent, moderate: Secondary | ICD-10-CM | POA: Diagnosis not present

## 2020-08-25 MED ORDER — BUPROPION HCL ER (XL) 300 MG PO TB24: 300.0000 mg | ORAL_TABLET | Freq: Every day | ORAL | 0 refills | Status: DC

## 2020-08-25 MED ORDER — LAMOTRIGINE 150 MG PO TABS: 150.0000 mg | ORAL_TABLET | Freq: Every morning | ORAL | 0 refills | Status: DC

## 2020-08-25 MED ORDER — LORAZEPAM 0.5 MG PO TABS: 0.5000 mg | ORAL_TABLET | Freq: Every day | ORAL | 2 refills | Status: DC | PRN

## 2020-08-25 MED FILL — buPROPion HCL ER (XL) 300 M: 300 | 90 days supply | Qty: 90 | Fill #0

## 2020-08-25 MED FILL — LORazepam 0.5 MG TABS: 0.5 | 30 days supply | Qty: 30 | Fill #0

## 2020-08-25 NOTE — Progress Notes (Signed)
Virtual Visit via Telephone Note  I connected with [facility] on 08/25/20 at  4:00 PM EST by telephone and verified that I am speaking with the correct person using two identifiers.  Location: Patient: Work Provider: Economist   I discussed the limitations, risks, security and privacy concerns of performing an evaluation and management service by telephone and the availability of in person appointments. I also discussed with the patient that there may be a patient responsible charge related to this service. The patient expressed understanding and agreed to proceed.   History of Present Illness: Patient is evaluated by phone session.  She is on the phone by herself.  She tried Xanax but did not like and she tried again Ativan and she feels it is working better than before.  She believes her body is now adjusting and she is not sleeping too much when she takes the Ativan during the day.  Sometimes she feel overwhelmed because of the job.  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  Her Christmas was okay.  She is pleased that her daughter who is in 10th grade has a time off from school and her husband was also at home.  She is planning to go to [place] to visit her sister who had a stroke.  She still sometimes struggles with attention and concentration but overall things are going well.  She is not taking Zoloft which was given by her PCP.  Sometimes she takes Klonopin left over however recommended not to take Klonopin if she is taking lorazepam.  She agreed with that.  Overall she feels her depression and anxiety is better.  She has no rash, itching, tremors or shakes.  She forgot to reschedule appointment with her therapist but like to schedule with [facility] again for therapy.  She denies drinking or using any illegal substances.  Past Psychiatric History: H/Odepression anxiety since age  39.No h/oinpatient treatment, suicidal attempt, psychosis, abuse. PCP tried Lexapro that stop working after 1 year, Abilify make mood worst.Buspar did not work, Seroquel and Zoloft did not work.   Psychiatric Specialty Exam: Physical Exam  Review of Systems  There were no vitals taken for this visit.There is no height or weight on file to calculate BMI.  General Appearance: NA  Eye Contact:  NA  Speech:  Normal Rate  Volume:  Normal  Mood:  Anxious  Affect:  NA  Thought Process:  Descriptions of Associations: Intact  Orientation:  Full (Time, Place, and Person)  Thought Content:  Rumination  Suicidal Thoughts:  No  Homicidal Thoughts:  No  Memory:  Immediate;   Good Recent;   Good Remote;   Good  Judgement:  Intact  Insight:  Fair  Psychomotor Activity:  NA  Concentration:  Concentration: Fair and Attention Span: Fair  Recall:  Fair  Fund of Knowledge:  Good  Language:  Good  Akathisia:  No  Handed:  Right  AIMS (if indicated):     Assets:  Communication Skills Desire for Improvement Housing Resilience Social Support Talents/Skills Transportation  ADL's:  Intact  Cognition:  WNL  Sleep:   ok      Assessment and Plan: Major depressive disorder, recurrent.  Anxiety.  Discontinue Xanax as patient did not like and is using Ativan which is now working well for her.  She denies any panic attack.  She is also not taking  Wellbutrin prescribed by her PCP.  Discussed medication side effects and benefits.  Encouraged to keep appointment with her therapist [facility].  We will inform front desk to schedule the patient so she can keep a therapy.  Continue lorazepam 0.5 mg at bedtime, recommend not to take Klonopin since taking the lorazepam.  Continue Lamictal 150 mg daily and Wellbutrin XL 300 mg daily.  Recommended to call us back if is any question or any concern.  Follow-up in 3 months.  Follow Up Instructions:    I discussed the assessment and treatment plan  with the patient. The patient was provided an opportunity to ask questions and all were answered. The patient agreed with the plan and demonstrated an understanding of the instructions.   The patient was advised to call back or seek an in-person evaluation if the symptoms worsen or if the condition fails to improve as anticipated.  I provided 15 minutes of non-face-to-face time during this encounter.   Cleotis Nipper, MD

## 2020-09-12 ENCOUNTER — Other Ambulatory Visit: Payer: Self-pay

## 2020-09-12 ENCOUNTER — Telehealth (HOSPITAL_COMMUNITY): Payer: Self-pay

## 2020-09-12 ENCOUNTER — Ambulatory Visit (HOSPITAL_COMMUNITY): Payer: Self-pay | Admitting: Clinical

## 2020-09-12 DIAGNOSIS — F489 Nonpsychotic mental disorder, unspecified: Secondary | ICD-10-CM

## 2020-09-12 MED FILL — SERTRALINE HCL 100 MG TABS: 100 | 90 days supply | Qty: 90 | Fill #0

## 2020-09-12 NOTE — Telephone Encounter (Signed)
Medication management - Patient left a message her power was out and her phone was not charged so she was not sure she would be able to keep therapy appointment today.  Requests to reschedule if cannot connect.

## 2020-09-12 NOTE — Progress Notes (Signed)
Patient ID: [facility], female   DOB: Jun 23, 1981, 40 y.o.   MRN: [id number] CSW made two attempts to reach pt for scheduled visit. CSW lvm on mobile number listed in chart at 309pm and 314pm, unable to reach pt via phone.

## 2020-09-22 MED FILL — LAMOTRIGINE 150 MG TABS: 150 | 90 days supply | Qty: 90 | Fill #0

## 2020-09-29 MED FILL — LORAZEPAM 0.5 MG TABS: 0.5 | 30 days supply | Qty: 30 | Fill #1

## 2020-09-30 ENCOUNTER — Other Ambulatory Visit: Payer: Self-pay | Admitting: Family Medicine

## 2020-09-30 DIAGNOSIS — Z1339 Encounter for screening examination for other mental health and behavioral disorders: Secondary | ICD-10-CM

## 2020-10-03 ENCOUNTER — Ambulatory Visit (HOSPITAL_COMMUNITY): Payer: Self-pay | Admitting: Clinical

## 2020-10-03 ENCOUNTER — Other Ambulatory Visit: Payer: Self-pay

## 2020-10-05 ENCOUNTER — Other Ambulatory Visit: Payer: Self-pay

## 2020-10-05 ENCOUNTER — Ambulatory Visit (INDEPENDENT_AMBULATORY_CARE_PROVIDER_SITE_OTHER): Payer: No Typology Code available for payment source | Admitting: Clinical

## 2020-10-05 DIAGNOSIS — F419 Anxiety disorder, unspecified: Secondary | ICD-10-CM

## 2020-10-05 DIAGNOSIS — F331 Major depressive disorder, recurrent, moderate: Secondary | ICD-10-CM | POA: Diagnosis not present

## 2020-10-05 DIAGNOSIS — Z566 Other physical and mental strain related to work: Secondary | ICD-10-CM | POA: Diagnosis not present

## 2020-10-05 NOTE — Progress Notes (Signed)
   THERAPIST PROGRESS NOTE  Session Time: 1pm  Participation Level: Active  Behavioral Response: CasualAlertDysphoric  Type of Therapy: Individual Therapy  Treatment Goals addressed: Coping  Interventions: CBT  Virtual Visit via Video Note  I connected with [facility] on 10/05/20 at  1:00 PM EST by a video enabled telemedicine application and verified that I am speaking with the correct person using two identifiers.  Location: Patient: Home Provider: office   I discussed the limitations of evaluation and management by telemedicine and the availability of in person appointments. The patient expressed understanding and agreed to proceed.   I discussed the assessment and treatment plan with the patient. The patient was provided an opportunity to ask questions and all were answered. The patient agreed with the plan and demonstrated an understanding of the instructions.   The patient was advised to call back or seek an in-person evaluation if the symptoms worsen or if the condition fails to improve as anticipated.  I provided 50 minutes of non-face-to-face time during this encounter.  Summary: [facility] is a 40 y.o. female who presents in a dysphoric mood. Pt became tearful during session as she discussed the stressors she has been experiencing. Pt reports work related stressors and having difficulty concentrating. Pt states her PCP has referred her to have an evaluation to be assessed for ADHD. Pt states she is waiting on the appointment to be scheduled. Pt discussed experiencing challenges at home where she feels overwhelmed by some of her responsibilities and financial strain. Pt states she is considering speaking with PCP and taking leave of absence from work.   Suicidal/Homicidal:pt denies SI/HI no intent or plan reported. Pt provided with crisis resources(national suicide prevention lifeline, [facility], [facility]) and encouraged to call 911 in the event of an emergency.    Therapist Response: CSW assisted pt in the completion of treatment plan. Discussed with pt self care tips to assist with managing stressors. Actively listened as pt discussed stressors at home and work. Provided pt with stress management techniques.  Plan: Return again in 1 weeks.  Diagnosis: Axis I: Major depressive disorder, recurrent, moderate    Anxiety    Work related stress    Axis II: No diagnosis    Suzan Slick, LCSW 10/05/2020

## 2020-10-06 ENCOUNTER — Telehealth (HOSPITAL_COMMUNITY): Payer: Self-pay | Admitting: *Deleted

## 2020-10-06 NOTE — Telephone Encounter (Signed)
Referrals made for ADD/ADHD evaluation to [facility] and [facility].

## 2020-10-12 ENCOUNTER — Other Ambulatory Visit: Payer: Self-pay

## 2020-10-12 ENCOUNTER — Encounter: Payer: Self-pay | Admitting: Family Medicine

## 2020-10-12 ENCOUNTER — Other Ambulatory Visit: Payer: Self-pay | Admitting: Family Medicine

## 2020-10-12 ENCOUNTER — Ambulatory Visit (INDEPENDENT_AMBULATORY_CARE_PROVIDER_SITE_OTHER): Payer: No Typology Code available for payment source | Admitting: Family Medicine

## 2020-10-12 VITALS — BP 138/86 | HR 96 | Temp 98.7°F | Ht 61.0 in | Wt 189.2 lb

## 2020-10-12 DIAGNOSIS — E669 Obesity, unspecified: Secondary | ICD-10-CM

## 2020-10-12 MED ORDER — PHENTERMINE HCL 37.5 MG PO TABS: 37.5000 mg | ORAL_TABLET | Freq: Every day | ORAL | 0 refills | Status: DC

## 2020-10-12 MED FILL — PHENTERMINE 37.5 MG TABLET: 37.5 | 30 days supply | Qty: 30 | Fill #0

## 2020-10-12 NOTE — Patient Instructions (Addendum)
Try to get around 60-70 g of protein daily.  Take monthly selfies of yourself to measure your body changes.  Aim to do some physical exertion for 150 minutes per week. This is typically divided into 5 days per week, 30 minutes per day. The activity should be enough to get your heart rate up. Anything is better than nothing if you have time constraints. Please consider weight resistance training.   Let me know if there are cost issues.  Let us know if you need anything.

## 2020-10-12 NOTE — Progress Notes (Signed)
Chief Complaint  Patient presents with  . Anxiety    Subjective: Patient is a 40 y.o. female here for f/u.  Patient has a history of anxiety.  She is currently following with the psychiatry team.  She is on Lamictal, Ativan, and Wellbutrin.  She is also following with a therapist.  Life has been stressful with family.  No homicidal or suicidal ideation.  The patient has a history of obesity.  She has undergone bariatric surgery with good results overall.  She walks daily.  She has plateaued with her weight loss and is currently hovering in the 180 pound range.  She does not do much weight resistance exercise.  Maintains a healthy diet overall.  She has never been on any weight loss medication in the past.  Past Medical History:  Diagnosis Date  . Anxiety and depression 09/24/2016  . Asthma     controlled   . Chicken pox    As a child.  . Depression   . Diabetes mellitus type 2 in obese (HCC) 09/24/2016  . Fatty liver   . Kidney stones   . Migraines   . PONV (postoperative nausea and vomiting)    vomiting with epidural during childbirth     Objective: BP 138/86 (BP Location: Left Arm, Patient Position: Sitting, Cuff Size: Normal)   Pulse 96   Temp 98.7 F (37.1 C) (Oral)   Ht 5\' 1"  (1.549 m)   Wt 189 lb 4 oz (85.8 kg)   SpO2 99%   BMI 35.76 kg/m  General: Awake, appears stated age HEENT: MMM, EOMi Heart: RRR, no murmurs Lungs: CTAB, no rales, wheezes or rhonchi. No accessory muscle use Psych: Age appropriate judgment and insight, normal affect and mood  Assessment and Plan: Obesity (BMI 30-39.9) - Plan: phentermine (ADIPEX-P) 37.5 MG tablet  Chronic, uncontrolled.  Trial phentermine.  Counseled the patient on this being a stimulant and it may affect her anxiety.  Unfortunate this is likely the cheapest option given her insurance.  We did discuss GLP-1's.  Counseled on diet and exercise.  I would like her to increase her weight resistance exercise. The patient voiced  understanding and agreement to the plan.  [place], DO 10/12/20  9:56 AM

## 2020-10-13 ENCOUNTER — Telehealth (HOSPITAL_COMMUNITY): Payer: Self-pay

## 2020-10-13 NOTE — Telephone Encounter (Signed)
Patient called and stated that she has FMLA paperwork that she wants [place] to fill out. The last appointment she had with you was on 08/25/20. If you agree to do the paperwork, do you want her to make an appointment to discuss it with you so we have updated information? Please review and advise. Thank you

## 2020-10-14 NOTE — Telephone Encounter (Signed)
If it is not urgent than she she can wait until her next appointment.

## 2020-10-18 ENCOUNTER — Telehealth (HOSPITAL_COMMUNITY): Payer: Self-pay | Admitting: *Deleted

## 2020-10-18 ENCOUNTER — Other Ambulatory Visit (HOSPITAL_COMMUNITY): Payer: Self-pay | Admitting: *Deleted

## 2020-10-18 MED ORDER — HYDROXYZINE HCL 10 MG PO TABS: 10.0000 mg | ORAL_TABLET | Freq: Every evening | ORAL | 0 refills | Status: DC | PRN

## 2020-10-18 MED FILL — hydrOXYzine HCL 10 MG TABS: 10 | 30 days supply | Qty: 60 | Fill #0

## 2020-10-18 NOTE — Telephone Encounter (Signed)
She can try low-dose hydroxyzine 10 to 20 mg at bedtime to help the itching.  If she agree please call to her local pharmacy.

## 2020-10-18 NOTE — Telephone Encounter (Signed)
Pt called with c/o "every night' having extreme "itching". No new meds started. Pt states that she has not started the Phentermine and that this has been happening for several months although she just thought it was anxiety but now says it is not related. Please review and advise.

## 2020-10-19 ENCOUNTER — Telehealth (HOSPITAL_COMMUNITY): Payer: Self-pay | Admitting: *Deleted

## 2020-10-19 NOTE — Telephone Encounter (Signed)
Received fax from [facility] regarding referral for ADD/ADHD assessment stating that they do not do that testing, despite what it says on the website. si Clinical research associate will refer to another provider.

## 2020-10-20 NOTE — Telephone Encounter (Signed)
Relayed message to patient. She will talk with him about it at her appointment on 3/24. I informed her I can do her paperwork for her then after she has her visit with the dr. Rock Nephew was agreeable

## 2020-10-28 ENCOUNTER — Ambulatory Visit: Payer: Self-pay | Admitting: Family Medicine

## 2020-11-09 ENCOUNTER — Encounter: Payer: Self-pay | Admitting: Family Medicine

## 2020-11-09 ENCOUNTER — Ambulatory Visit (INDEPENDENT_AMBULATORY_CARE_PROVIDER_SITE_OTHER): Payer: No Typology Code available for payment source | Admitting: Family Medicine

## 2020-11-09 ENCOUNTER — Other Ambulatory Visit: Payer: Self-pay

## 2020-11-09 ENCOUNTER — Telehealth: Payer: Self-pay

## 2020-11-09 ENCOUNTER — Other Ambulatory Visit (HOSPITAL_BASED_OUTPATIENT_CLINIC_OR_DEPARTMENT_OTHER): Payer: Self-pay | Admitting: Family Medicine

## 2020-11-09 VITALS — BP 132/88 | HR 79 | Temp 97.8°F | Resp 17 | Ht 61.0 in | Wt 188.0 lb

## 2020-11-09 DIAGNOSIS — E669 Obesity, unspecified: Secondary | ICD-10-CM | POA: Diagnosis not present

## 2020-11-09 MED ORDER — SAXENDA 18 MG/3ML ~~LOC~~ SOPN: 0.6000 mg | PEN_INJECTOR | Freq: Every day | SUBCUTANEOUS | 0 refills | Status: DC

## 2020-11-09 NOTE — Telephone Encounter (Signed)
Initiated PA for patient's Saxenda via cover my meds.   KEY:  [id number]  Sent to plan. Waiting on determination.

## 2020-11-09 NOTE — Progress Notes (Signed)
Chief Complaint  Patient presents with  . Follow-up    Follow up phentermine    Subjective: Patient is a 40 y.o. female here for obesity follow-up.  This patient has a history of bariatric surgery and is down around 40 pounds.  She has since plateaued and was curious if any medication could help her further.  She was trialed on 37.5 mg of phentermine daily.  She reports compliance and an adverse effect of increasing her appetite.  She feels she cannot further lose weight.  She has been doing well with her dieting and exercising otherwise.  She is interested in trying something else.  Past Medical History:  Diagnosis Date  . Anxiety and depression 09/24/2016  . Asthma     controlled   . Chicken pox    As a child.  . Depression   . Diabetes mellitus type 2 in obese (HCC) 09/24/2016  . Fatty liver   . Kidney stones   . Migraines   . PONV (postoperative nausea and vomiting)    vomiting with epidural during childbirth     Objective: BP 132/88 (BP Location: Left Arm, Patient Position: Sitting, Cuff Size: Normal)   Pulse 79   Temp 97.8 F (36.6 C)   Resp 17   Ht 5\' 1"  (1.549 m)   Wt 188 lb (85.3 kg)   SpO2 98%   BMI 35.52 kg/m  General: Awake, appears stated age Lungs: No accessory muscle use Psych: Age appropriate judgment and insight, normal affect and mood  Assessment and Plan: Obesity, unspecified classification, unspecified obesity type, unspecified whether serious comorbidity present - Plan: Liraglutide -Weight Management (SAXENDA) 18 MG/3ML SOPN  Status: Chronic, uncontrolled.  Stop phentermine as it was not effective and even increased her appetite.  We will start Saxenda daily.  She is not currently planning on getting pregnant.  Counseled on diet and exercise.  I will see her as originally scheduled for her annual visit in around 6 weeks. The patient voiced understanding and agreement to the plan.  [place], DO 11/09/20  8:26 AM

## 2020-11-09 NOTE — Patient Instructions (Addendum)
Let me know if there are any cost issues with the new medication.  Keep the diet clean and stay active.   Let us know if you need anything.

## 2020-11-15 MED FILL — SAXENDA 18 MG/3 ML PEN: 18 | 30 days supply | Qty: 6 | Fill #0

## 2020-11-16 ENCOUNTER — Encounter (HOSPITAL_COMMUNITY): Payer: Self-pay | Admitting: *Deleted

## 2020-11-16 MED FILL — TECHLITE PEN NDL 32GX1/4: 32G X 6 MM | 90 days supply | Qty: 100 | Fill #0

## 2020-11-17 ENCOUNTER — Other Ambulatory Visit (HOSPITAL_COMMUNITY): Payer: Self-pay | Admitting: Psychiatry

## 2020-11-17 ENCOUNTER — Telehealth (INDEPENDENT_AMBULATORY_CARE_PROVIDER_SITE_OTHER): Payer: No Typology Code available for payment source | Admitting: Psychiatry

## 2020-11-17 ENCOUNTER — Other Ambulatory Visit: Payer: Self-pay

## 2020-11-17 VITALS — Wt 182.0 lb

## 2020-11-17 DIAGNOSIS — F419 Anxiety disorder, unspecified: Secondary | ICD-10-CM

## 2020-11-17 DIAGNOSIS — F331 Major depressive disorder, recurrent, moderate: Secondary | ICD-10-CM | POA: Diagnosis not present

## 2020-11-17 MED ORDER — LAMOTRIGINE 150 MG PO TABS: 150.0000 mg | ORAL_TABLET | Freq: Every morning | ORAL | 0 refills | Status: DC

## 2020-11-17 MED ORDER — HYDROXYZINE HCL 10 MG PO TABS
10.0000 mg | ORAL_TABLET | Freq: Every day | ORAL | 0 refills | Status: DC | PRN
Start: 2020-11-17 — End: 2020-11-17

## 2020-11-17 MED ORDER — BUPROPION HCL ER (XL) 300 MG PO TB24: 300.0000 mg | ORAL_TABLET | Freq: Every day | ORAL | 0 refills | Status: DC

## 2020-11-17 MED ORDER — LORAZEPAM 0.5 MG PO TABS: ORAL_TABLET | ORAL | 1 refills | Status: DC

## 2020-11-17 MED FILL — hydrOXYzine HCL 10 MG TABS: 10 | 30 days supply | Qty: 30 | Fill #0

## 2020-11-17 MED FILL — LORAZEPAM 0.5 MG TABS: 0.5 | 30 days supply | Qty: 30 | Fill #2

## 2020-11-17 NOTE — Progress Notes (Signed)
Virtual Visit via Telephone Note  I connected with [facility] on 11/17/20 at  4:00 PM EDT by telephone and verified that I am speaking with the correct person using two identifiers.  Location: Patient: Work Provider: Economist   I discussed the limitations, risks, security and privacy concerns of performing an evaluation and management service by telephone and the availability of in person appointments. I also discussed with the patient that there may be a patient responsible charge related to this service. The patient expressed understanding and agreed to proceed.   History of Present Illness: Patient is evaluated by phone session.  She is taking Ativan and hydroxyzine which is helping her sleep but she still feels anxious and nervous.  Sometimes she feels the medicine is not working but she also like to keep the current medicine because she is not sure if she stopped the medicine her symptoms may get worse.  She has virtual evaluation for ADD next week.  She admitted struggle with attention, concentration but her biggest concern is anxiety.  Patient did not specify that triggers anxiety but she feels her work and family situation making her more anxious.  She had changed the office and she is struggling with change.  Her daughter who is on 10th grade seeing a therapist and she is concerned about her.  We have recommended to see a therapist and she did had a 2 visits with therapist but then she decided to continue family therapy with her daughter.  Her physician started her on phentermine for weight loss but patient felt that it did not work even though she lost 3 pounds since the last visit.  She had stopped taking phentermine.  She denies any crying spells or any suicidal thoughts.  However she endorsed ruminative thoughts, regret that she is not helpful for her family.  She did go to visit her sister in [place] who had a stroke but she gets very sad after visiting the sister.  She sleeps 4  to 5 hours.  Her appetite is okay.  She denies drinking or using any illegal substances.  She has no rash, tremors but takes hydroxyzine to help the itching.   Past Psychiatric History: H/Odepression anxiety since age 83.No h/oinpatient treatment, suicidal attempt, psychosis, abuse. PCP tried Lexapro that stop working after 1 year, Abilify make mood worst.Buspar did not work,Seroquel and Zoloft did not work.  Klonopin, Xanax and gabapentin did not work  Psychiatric Specialty Exam: Physical Exam  Review of Systems  Weight 182 lb (82.6 kg).There is no height or weight on file to calculate BMI.  General Appearance: NA  Eye Contact:  NA  Speech:  Slow  Volume:  Normal  Mood:  Anxious  Affect:  NA  Thought Process:  Goal Directed  Orientation:  Full (Time, Place, and Person)  Thought Content:  Rumination  Suicidal Thoughts:  No  Homicidal Thoughts:  No  Memory:  Immediate;   Good Recent;   Good Remote;   Good  Judgement:  Fair  Insight:  Fair  Psychomotor Activity:  NA  Concentration:  Concentration: Fair and Attention Span: Fair  Recall:  Fair  Fund of Knowledge:  Good  Language:  Good  Akathisia:  No  Handed:  Right  AIMS (if indicated):     Assets:  Communication Skills Desire for Improvement Housing Transportation  ADL's:  Intact  Cognition:  WNL  Sleep:   4-5 hrs      Assessment and Plan: Major depressive disorder,  recurrent.  Anxiety.  I discussed to try different medication but patient reluctant to try different dose or different medication.  She felt that her symptoms are not as bad before the medication and she is worried if she changed the medication then the new medicine may not work.  After some discussion she agreed to try extra Ativan during the day if she feels very nervous and anxious.  I encouraged to keep appointment for virtual evaluation to rule out ADHD.  However we discussed most of the ADHD medication may cause worsening of anxiety.  Patient  recall taking Klonopin, gabapentin, Xanax in the past that did not work.  She also tried Zoloft, Lexapro, Abilify, Seroquel, BuSpar which was ineffective.  Currently she like to keep the current medication.  I strongly encourage to consider individual therapy to help her coping skills.  She promised that she will give [place] a call to schedule appointment with therapist.  For now continue Lamictal 150 mg daily, Wellbutrin XL 300 mg daily, hydroxyzine 10 mg 1 to 2 tablet to help the anxiety and itching and we will adjust the dose of Ativan 0.5 mg at bedtime and second during the day if needed for anxiety.  Recommended to call us back if she has any question or any concern.  Follow-up in 3 months.  Follow Up Instructions:    I discussed the assessment and treatment plan with the patient. The patient was provided an opportunity to ask questions and all were answered. The patient agreed with the plan and demonstrated an understanding of the instructions.   The patient was advised to call back or seek an in-person evaluation if the symptoms worsen or if the condition fails to improve as anticipated.  I provided 19 minutes of non-face-to-face time during this encounter.   Cleotis Nipper, MD

## 2020-11-26 ENCOUNTER — Other Ambulatory Visit (HOSPITAL_BASED_OUTPATIENT_CLINIC_OR_DEPARTMENT_OTHER): Payer: Self-pay

## 2020-12-12 ENCOUNTER — Ambulatory Visit (INDEPENDENT_AMBULATORY_CARE_PROVIDER_SITE_OTHER): Payer: No Typology Code available for payment source | Admitting: Psychology

## 2020-12-12 DIAGNOSIS — F89 Unspecified disorder of psychological development: Secondary | ICD-10-CM | POA: Diagnosis not present

## 2020-12-14 ENCOUNTER — Other Ambulatory Visit: Payer: Self-pay

## 2020-12-14 ENCOUNTER — Other Ambulatory Visit (HOSPITAL_COMMUNITY)
Admission: RE | Admit: 2020-12-14 | Discharge: 2020-12-14 | Disposition: A | Discharge status: Home / Self Care | Payer: No Typology Code available for payment source | Source: Ambulatory Visit | Attending: Family Medicine | Admitting: Family Medicine

## 2020-12-14 ENCOUNTER — Encounter: Payer: Self-pay | Admitting: Family Medicine

## 2020-12-14 ENCOUNTER — Ambulatory Visit (INDEPENDENT_AMBULATORY_CARE_PROVIDER_SITE_OTHER): Payer: No Typology Code available for payment source | Admitting: Family Medicine

## 2020-12-14 VITALS — BP 116/68 | HR 80 | Ht 61.0 in | Wt 185.0 lb

## 2020-12-14 DIAGNOSIS — Z01419 Encounter for gynecological examination (general) (routine) without abnormal findings: Secondary | ICD-10-CM | POA: Insufficient documentation

## 2020-12-14 NOTE — Progress Notes (Signed)
GYNECOLOGY ANNUAL PREVENTATIVE CARE ENCOUNTER NOTE  Subjective:   Martha Shannon is a 40 y.o. G1P1 female here for a routine annual gynecologic exam.  Current complaints: none.   Denies abnormal vaginal bleeding, discharge, pelvic pain, problems with intercourse or other gynecologic concerns.    Gynecologic History Patient's last menstrual period was 11/22/2020. Patient is sexually active  Contraception: none Last Pap: 3-4 years ago. Results were: normal Last mammogram: n/a  Obstetric History OB History  Gravida Para Term Preterm AB Living  1 1       1   SAB IAB Ectopic Multiple Live Births               # Outcome Date GA Lbr Len/2nd Weight Sex Delivery Anes PTL Lv  1 Para             Past Medical History:  Diagnosis Date  . Anxiety and depression 09/24/2016  . Asthma     controlled   . Chicken pox    As a child.  . Depression   . Diabetes mellitus type 2 in obese (HCC) 09/24/2016  . Fatty liver   . Kidney stones   . Migraines   . PONV (postoperative nausea and vomiting)    vomiting with epidural during childbirth     Past Surgical History:  Procedure Laterality Date  . CESAREAN SECTION     2006.  2007 LAPAROSCOPIC GASTRIC SLEEVE RESECTION N/A 04/28/2019   Procedure: LAPAROSCOPIC GASTRIC SLEEVE RESECTION, Upper Endo, ERAS Pathway;  Surgeon: Kinsinger, 06/28/2019, MD;  Location: [facility];  Service: General;  Laterality: N/A;    Current Outpatient Medications on File Prior to Visit  Medication Sig Dispense Refill  . buPROPion (WELLBUTRIN XL) 300 MG 24 hr tablet TAKE 1 TABLET BY MOUTH ONCE DAILY 90 tablet 0  . hydrOXYzine (ATARAX/VISTARIL) 10 MG tablet TAKE 1 TABLET BY MOUTH ONCE DAILY AS NEEDED FOR ITCHING 30 tablet 0  . Insulin Pen Needle 32G X 6 MM MISC USE T0 INJECT SAXENDA 100 each 0  . lamoTRIgine (LAMICTAL) 150 MG tablet TAKE 1 TABLET BY MOUTH IN THE MORNING 90 tablet 0  . Liraglutide -Weight Management 18 MG/3ML SOPN INJECT 0.6 MG INTO THE SKIN DAILY. THEN  1.2 MG/DAY FOR 7 DAYS, THEN 1.8 MG/DAY FOR 7 DAYS, THEN 2.4 MG/D FOR 7 DAYS, THEN 3.0 MG/DAY. 6 mL 0  . LORazepam (ATIVAN) 0.5 MG tablet TAKE 1 TABLET BY MOUTH ONCE DAILY AS NEEDED FOR SEVERE ANXIETY AND 1 TAB AT BEDTIME 45 tablet 1  . ondansetron (ZOFRAN-ODT) 4 MG disintegrating tablet DISSOLVE 1 TABLET (4 MG TOTAL) BY MOUTH EVERY 8 (EIGHT) HOURS AS NEEDED FOR NAUSEA OR VOMITING. 20 tablet 0  . ibuprofen (ADVIL) 800 MG tablet TAKE 1 TABLET BY MOUTH THREE TIMES DAILY AS NEEDED FOR PAIN 15 tablet 0  . [DISCONTINUED] phentermine (ADIPEX-P) 37.5 MG tablet Take 1 tablet (37.5 mg total) by mouth daily before breakfast. 30 tablet 0  . [DISCONTINUED] QUEtiapine (SEROQUEL) 50 MG tablet Take 1 tablet (50 mg total) by mouth at bedtime. (Patient not taking: Reported on 05/09/2020) 30 tablet 2  . [DISCONTINUED] sertraline (ZOLOFT) 100 MG tablet TAKE 1 TABLET BY MOUTH ONCE DAILY 90 tablet 2  . [DISCONTINUED] traZODone (DESYREL) 50 MG tablet TAKE 1/2-1 TABLET (25-50 MG TOTAL) BY MOUTH AT BEDTIME AS NEEDED FOR SLEEP. 30 tablet 3   No current facility-administered medications on file prior to visit.    Allergies  Allergen Reactions  . Naproxen  Legs felt agitated, and restleness     Social History   Socioeconomic History  . Marital status: Married    Spouse name: Not on file  . Number of children: Not on file  . Years of education: Not on file  . Highest education level: Not on file  Occupational History  . Not on file  Tobacco Use  . Smoking status: Never Smoker  . Smokeless tobacco: Never Used  Vaping Use  . Vaping Use: Never used  Substance and Sexual Activity  . Alcohol use: Not Currently  . Drug use: No  . Sexual activity: Yes    Birth control/protection: None    Comment: 1ST INTERCOURSE- 14, PARTNERS -1  Other Topics Concern  . Not on file  Social History Narrative  . Not on file   Social Determinants of Health   Financial Resource Strain: Not on file  Food Insecurity: Not on  file  Transportation Needs: Not on file  Physical Activity: Not on file  Stress: Not on file  Social Connections: Not on file  Intimate Partner Violence: Not on file    Family History  Problem Relation Age of Onset  . Diabetes Mother   . Diabetes Father   . Hypertension Father   . Heart disease Father   . Diabetes Sister   . Alzheimer's disease Paternal Grandmother   . Alzheimer's disease Paternal Grandfather     The following portions of the patient's history were reviewed and updated as appropriate: allergies, current medications, past family history, past medical history, past social history, past surgical history and problem list.  Review of Systems Pertinent items are noted in HPI.   Objective:  BP 116/68   Pulse 80   Ht 5\' 1"  (1.549 m)   Wt 185 lb (83.9 kg)   LMP 11/22/2020   BMI 34.96 kg/m  Wt Readings from Last 3 Encounters:  12/14/20 185 lb (83.9 kg)  11/09/20 188 lb (85.3 kg)  10/12/20 189 lb 4 oz (85.8 kg)     Chaperone present during exam  CONSTITUTIONAL: Well-developed, well-nourished female in no acute distress.  HENT:  Normocephalic, atraumatic, External right and left ear normal. Oropharynx is clear and moist EYES: Conjunctivae and EOM are normal. Pupils are equal, round, and reactive to light. No scleral icterus.  NECK: Normal range of motion, supple, no masses.  Normal thyroid.   CARDIOVASCULAR: Normal heart rate noted, regular rhythm RESPIRATORY: Clear to auscultation bilaterally. Effort and breath sounds normal, no problems with respiration noted. BREASTS: Symmetric in size. No masses, skin changes, nipple drainage, or lymphadenopathy. ABDOMEN: Soft, normal bowel sounds, no distention noted.  No tenderness, rebound or guarding.  PELVIC: Normal appearing external genitalia; normal appearing vaginal mucosa and cervix.  No abnormal discharge noted.  Normal uterine size, no other palpable masses, no uterine or adnexal tenderness. MUSCULOSKELETAL:  Normal range of motion. No tenderness.  No cyanosis, clubbing, or edema.  2+ distal pulses. SKIN: Skin is warm and dry. No rash noted. Not diaphoretic. No erythema. No pallor. NEUROLOGIC: Alert and oriented to person, place, and time. Normal reflexes, muscle tone coordination. No cranial nerve deficit noted. PSYCHIATRIC: Normal mood and affect. Normal behavior. Normal judgment and thought content.  Assessment:  Annual gynecologic examination with pap smear   Plan:  1. Well Woman Exam Will follow up results of pap smear and manage accordingly. Mammogram scheduled    Routine preventative health maintenance measures emphasized. Please refer to After Visit Summary for other counseling recommendations.  Candelaria Celeste, DO [facility]

## 2020-12-16 LAB — CYTOLOGY - PAP
Comment: NEGATIVE
Diagnosis: NEGATIVE
Diagnosis: REACTIVE
High risk HPV: NEGATIVE

## 2020-12-17 ENCOUNTER — Other Ambulatory Visit (HOSPITAL_BASED_OUTPATIENT_CLINIC_OR_DEPARTMENT_OTHER): Payer: Self-pay | Admitting: Family Medicine

## 2020-12-17 ENCOUNTER — Other Ambulatory Visit (HOSPITAL_COMMUNITY): Payer: Self-pay | Admitting: Psychiatry

## 2020-12-17 ENCOUNTER — Other Ambulatory Visit: Payer: Self-pay | Admitting: Family Medicine

## 2020-12-17 DIAGNOSIS — E669 Obesity, unspecified: Secondary | ICD-10-CM

## 2020-12-17 DIAGNOSIS — Z1231 Encounter for screening mammogram for malignant neoplasm of breast: Secondary | ICD-10-CM

## 2020-12-17 DIAGNOSIS — F419 Anxiety disorder, unspecified: Secondary | ICD-10-CM

## 2020-12-17 MED FILL — Bupropion HCl Tab ER 24HR 300 MG: ORAL | 90 days supply | Qty: 90 | Fill #0 | Status: CN

## 2020-12-17 MED FILL — Lorazepam Tab 0.5 MG: ORAL | 23 days supply | Qty: 45 | Fill #0 | Status: AC

## 2020-12-17 MED FILL — Lamotrigine Tab 150 MG: ORAL | 90 days supply | Qty: 90 | Fill #0 | Status: AC

## 2020-12-19 ENCOUNTER — Other Ambulatory Visit (HOSPITAL_BASED_OUTPATIENT_CLINIC_OR_DEPARTMENT_OTHER): Payer: Self-pay

## 2020-12-19 MED ORDER — SAXENDA 18 MG/3ML ~~LOC~~ SOPN
PEN_INJECTOR | SUBCUTANEOUS | 0 refills | Status: DC
  Filled 2020-12-19: qty 6, 30d supply, fill #0

## 2020-12-21 ENCOUNTER — Other Ambulatory Visit (HOSPITAL_COMMUNITY): Payer: Self-pay | Admitting: Psychiatry

## 2020-12-21 ENCOUNTER — Other Ambulatory Visit (HOSPITAL_BASED_OUTPATIENT_CLINIC_OR_DEPARTMENT_OTHER): Payer: Self-pay

## 2020-12-21 DIAGNOSIS — F419 Anxiety disorder, unspecified: Secondary | ICD-10-CM

## 2020-12-21 MED ORDER — HYDROXYZINE HCL 10 MG PO TABS
ORAL_TABLET | ORAL | 0 refills | Status: DC
  Filled 2020-12-21: qty 30, fill #0

## 2020-12-22 ENCOUNTER — Other Ambulatory Visit (HOSPITAL_BASED_OUTPATIENT_CLINIC_OR_DEPARTMENT_OTHER): Payer: Self-pay

## 2020-12-22 ENCOUNTER — Other Ambulatory Visit (HOSPITAL_COMMUNITY): Payer: Self-pay | Admitting: Psychiatry

## 2020-12-22 DIAGNOSIS — F419 Anxiety disorder, unspecified: Secondary | ICD-10-CM

## 2020-12-24 ENCOUNTER — Inpatient Hospital Stay (HOSPITAL_BASED_OUTPATIENT_CLINIC_OR_DEPARTMENT_OTHER): Admission: RE | Admit: 2020-12-24 | Payer: No Typology Code available for payment source | Source: Ambulatory Visit

## 2020-12-24 DIAGNOSIS — Z1231 Encounter for screening mammogram for malignant neoplasm of breast: Secondary | ICD-10-CM

## 2020-12-26 ENCOUNTER — Encounter: Payer: No Typology Code available for payment source | Admitting: Family Medicine

## 2020-12-26 ENCOUNTER — Other Ambulatory Visit (HOSPITAL_BASED_OUTPATIENT_CLINIC_OR_DEPARTMENT_OTHER): Payer: Self-pay

## 2020-12-27 ENCOUNTER — Other Ambulatory Visit (HOSPITAL_COMMUNITY): Payer: Self-pay | Admitting: Psychiatry

## 2020-12-27 ENCOUNTER — Other Ambulatory Visit (HOSPITAL_BASED_OUTPATIENT_CLINIC_OR_DEPARTMENT_OTHER): Payer: Self-pay

## 2020-12-27 DIAGNOSIS — F419 Anxiety disorder, unspecified: Secondary | ICD-10-CM

## 2020-12-29 ENCOUNTER — Ambulatory Visit: Payer: No Typology Code available for payment source | Admitting: Psychology

## 2020-12-31 ENCOUNTER — Ambulatory Visit: Payer: No Typology Code available for payment source | Admitting: Psychology

## 2021-01-03 ENCOUNTER — Other Ambulatory Visit (HOSPITAL_BASED_OUTPATIENT_CLINIC_OR_DEPARTMENT_OTHER): Payer: Self-pay

## 2021-01-03 ENCOUNTER — Other Ambulatory Visit (HOSPITAL_COMMUNITY): Payer: Self-pay | Admitting: Psychiatry

## 2021-01-03 DIAGNOSIS — F419 Anxiety disorder, unspecified: Secondary | ICD-10-CM

## 2021-01-06 ENCOUNTER — Other Ambulatory Visit (HOSPITAL_COMMUNITY): Payer: Self-pay | Admitting: Psychiatry

## 2021-01-06 ENCOUNTER — Other Ambulatory Visit (HOSPITAL_BASED_OUTPATIENT_CLINIC_OR_DEPARTMENT_OTHER): Payer: Self-pay

## 2021-01-06 DIAGNOSIS — F419 Anxiety disorder, unspecified: Secondary | ICD-10-CM

## 2021-01-09 ENCOUNTER — Other Ambulatory Visit (HOSPITAL_BASED_OUTPATIENT_CLINIC_OR_DEPARTMENT_OTHER): Payer: Self-pay

## 2021-01-11 ENCOUNTER — Ambulatory Visit (INDEPENDENT_AMBULATORY_CARE_PROVIDER_SITE_OTHER): Payer: No Typology Code available for payment source | Admitting: Psychology

## 2021-01-11 ENCOUNTER — Telehealth (HOSPITAL_COMMUNITY): Payer: Self-pay | Admitting: *Deleted

## 2021-01-11 ENCOUNTER — Other Ambulatory Visit (HOSPITAL_BASED_OUTPATIENT_CLINIC_OR_DEPARTMENT_OTHER): Payer: Self-pay

## 2021-01-11 DIAGNOSIS — F4310 Post-traumatic stress disorder, unspecified: Secondary | ICD-10-CM

## 2021-01-11 DIAGNOSIS — F902 Attention-deficit hyperactivity disorder, combined type: Secondary | ICD-10-CM

## 2021-01-11 NOTE — Telephone Encounter (Signed)
Yes will try to contact her again

## 2021-01-11 NOTE — Telephone Encounter (Signed)
Pt called to inform that she has completed ADHD PTSD assessment and it was "positive for both". Pt would like to know if she will need medication adjustment or "how to proceed". Writer returned pt call however did not receive and answer nor VM. Writer will confirm this was done at [facility] and call for report.

## 2021-01-11 NOTE — Telephone Encounter (Signed)
Can you call patient to get consent and assessment which was done.

## 2021-01-12 ENCOUNTER — Telehealth (HOSPITAL_COMMUNITY): Payer: Self-pay

## 2021-01-12 ENCOUNTER — Other Ambulatory Visit (HOSPITAL_BASED_OUTPATIENT_CLINIC_OR_DEPARTMENT_OTHER): Payer: Self-pay

## 2021-01-12 DIAGNOSIS — F419 Anxiety disorder, unspecified: Secondary | ICD-10-CM

## 2021-01-12 MED ORDER — HYDROXYZINE HCL 10 MG PO TABS
ORAL_TABLET | ORAL | 0 refills | Status: DC
  Filled 2021-01-12: qty 30, 30d supply, fill #0

## 2021-01-12 NOTE — Telephone Encounter (Signed)
Done

## 2021-01-12 NOTE — Telephone Encounter (Signed)
Received fax from [facility] requesting a refill  On patient's Hydroxyzine 10mg . Followup appointment scheduled for 6/22. Please review and advise. Thank you

## 2021-01-16 ENCOUNTER — Other Ambulatory Visit (HOSPITAL_BASED_OUTPATIENT_CLINIC_OR_DEPARTMENT_OTHER): Payer: Self-pay

## 2021-01-17 ENCOUNTER — Other Ambulatory Visit (HOSPITAL_BASED_OUTPATIENT_CLINIC_OR_DEPARTMENT_OTHER): Payer: Self-pay | Admitting: Family Medicine

## 2021-01-17 DIAGNOSIS — Z1231 Encounter for screening mammogram for malignant neoplasm of breast: Secondary | ICD-10-CM

## 2021-01-19 ENCOUNTER — Ambulatory Visit (HOSPITAL_BASED_OUTPATIENT_CLINIC_OR_DEPARTMENT_OTHER)
Admission: RE | Admit: 2021-01-19 | Discharge: 2021-01-19 | Disposition: A | Discharge status: Home / Self Care | Payer: No Typology Code available for payment source | Source: Ambulatory Visit | Attending: Family Medicine | Admitting: Family Medicine

## 2021-01-19 ENCOUNTER — Other Ambulatory Visit: Payer: Self-pay

## 2021-01-19 ENCOUNTER — Other Ambulatory Visit (HOSPITAL_BASED_OUTPATIENT_CLINIC_OR_DEPARTMENT_OTHER): Payer: Self-pay | Admitting: Family Medicine

## 2021-01-19 ENCOUNTER — Other Ambulatory Visit (HOSPITAL_BASED_OUTPATIENT_CLINIC_OR_DEPARTMENT_OTHER): Payer: Self-pay

## 2021-01-19 ENCOUNTER — Encounter (HOSPITAL_BASED_OUTPATIENT_CLINIC_OR_DEPARTMENT_OTHER): Payer: Self-pay

## 2021-01-19 DIAGNOSIS — Z1231 Encounter for screening mammogram for malignant neoplasm of breast: Secondary | ICD-10-CM | POA: Diagnosis present

## 2021-01-24 ENCOUNTER — Other Ambulatory Visit: Payer: Self-pay | Admitting: Family Medicine

## 2021-01-24 ENCOUNTER — Encounter: Payer: No Typology Code available for payment source | Admitting: Family Medicine

## 2021-01-24 DIAGNOSIS — R928 Other abnormal and inconclusive findings on diagnostic imaging of breast: Secondary | ICD-10-CM

## 2021-02-13 ENCOUNTER — Ambulatory Visit: Payer: No Typology Code available for payment source | Admitting: Family Medicine

## 2021-02-15 ENCOUNTER — Encounter (HOSPITAL_COMMUNITY): Payer: Self-pay | Admitting: Psychiatry

## 2021-02-15 ENCOUNTER — Other Ambulatory Visit: Payer: Self-pay

## 2021-02-15 ENCOUNTER — Telehealth (INDEPENDENT_AMBULATORY_CARE_PROVIDER_SITE_OTHER): Payer: No Typology Code available for payment source | Admitting: Psychiatry

## 2021-02-15 ENCOUNTER — Other Ambulatory Visit (HOSPITAL_BASED_OUTPATIENT_CLINIC_OR_DEPARTMENT_OTHER): Payer: Self-pay

## 2021-02-15 VITALS — Wt 180.0 lb

## 2021-02-15 DIAGNOSIS — F419 Anxiety disorder, unspecified: Secondary | ICD-10-CM | POA: Diagnosis not present

## 2021-02-15 DIAGNOSIS — F331 Major depressive disorder, recurrent, moderate: Secondary | ICD-10-CM

## 2021-02-15 MED ORDER — HYDROXYZINE PAMOATE 25 MG PO CAPS
25.0000 mg | ORAL_CAPSULE | Freq: Every evening | ORAL | 1 refills | Status: DC | PRN
  Filled 2021-02-15: qty 45, 22d supply, fill #0
  Filled 2021-03-27: qty 45, 22d supply, fill #1

## 2021-02-15 MED ORDER — LORAZEPAM 0.5 MG PO TABS
ORAL_TABLET | ORAL | 1 refills | Status: DC
  Filled 2021-02-15: qty 45, 23d supply, fill #0
  Filled 2021-03-27: qty 45, 23d supply, fill #1

## 2021-02-15 MED ORDER — BUPROPION HCL ER (XL) 300 MG PO TB24
ORAL_TABLET | Freq: Every day | ORAL | 0 refills | Status: DC
  Filled 2021-02-15: qty 90, 90d supply, fill #0

## 2021-02-15 MED ORDER — LAMOTRIGINE 150 MG PO TABS
ORAL_TABLET | Freq: Every morning | ORAL | 0 refills | Status: DC
  Filled 2021-02-15: qty 90, fill #0
  Filled 2021-03-27: qty 90, 90d supply, fill #0

## 2021-02-15 MED ORDER — BUPROPION HCL ER (XL) 150 MG PO TB24
150.0000 mg | ORAL_TABLET | Freq: Every day | ORAL | 0 refills | Status: DC
  Filled 2021-02-15: qty 90, 90d supply, fill #0

## 2021-02-15 NOTE — Progress Notes (Signed)
Virtual Visit via Telephone Note  I connected with Martha Shannon on 02/15/21 at  4:20 PM EDT by telephone and verified that I am speaking with the correct person using two identifiers.  Location: Patient: In Car Provider: Home Office   I discussed the limitations, risks, security and privacy concerns of performing an evaluation and management service by telephone and the availability of in person appointments. I also discussed with the patient that there may be a patient responsible charge related to this service. The patient expressed understanding and agreed to proceed.   History of Present Illness: Patient is evaluated by phone session.  She is taking Ativan as needed and she feels it does help her a lot and she is anxious.  She told family she had a testing at [facility] but we do not have the results.  Patient told that she was diagnosed with mild case of ADHD and PTSD.  The patient denies any nightmares or flashbacks.  She feels her depression is much better and she has no more crying spells, feeling of hopelessness or worthlessness.  She is taking Wellbutrin but still struggle some days with focus, attention.  She is sleeping good.  She denies any suicidal thoughts or homicidal thoughts.  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  Her daughter is seeing therapist.  Patient is excited about going to the [place] on a cruise trip with the family.  Patient like to keep the current medication since it is working better than she thought.  However she like to increase the hydroxyzine as some nights difficulty keeping her sleep.  She has no tremors, shakes, rash or any itching.  Past Psychiatric History: H/O depression anxiety since age 40.  No h/o inpatient treatment, suicidal attempt, psychosis, abuse.  PCP tried Lexapro that stop working after 1 year, Abilify make mood worst. Buspar did not work, Seroquel and Zoloft did not work.  Klonopin,  Xanax and gabapentin did not work   Psychiatric Specialty Exam: Physical Exam  Review of Systems  Weight 180 lb (81.6 kg).Body mass index is 34.01 kg/m.  General Appearance: NA  Eye Contact:  NA  Speech:  Normal Rate  Volume:  Normal  Mood:  Euthymic  Affect:  NA  Thought Process:  Goal Directed  Orientation:  Full (Time, Place, and Person)  Thought Content:  WDL  Suicidal Thoughts:  No  Homicidal Thoughts:  No  Memory:  Immediate;   Good Recent;   Good Remote;   Good  Judgement:  Intact  Insight:  Present  Psychomotor Activity:  NA  Concentration:  Concentration: Good and Attention Span: Good  Recall:  Good  Fund of Knowledge:  Good  Language:  Good  Akathisia:  No  Handed:  Right  AIMS (if indicated):     Assets:  Communication Skills Desire for Improvement Housing Resilience Social Support Talents/Skills Transportation  ADL's:  Intact  Cognition:  WNL  Sleep:   ok with hydroxyzine      Assessment and Plan: Major depressive disorder, recurrent.  Anxiety.  Discuss her current medication.  We do not have results from her psych testing however I explained most of the ADD medication can cause increased anxiety.  I recommend if she would want to try higher dose of Wellbutrin we can try to see if that helps her focus her attention but if she noticed it is causing worsening of anxiety then she should stay on just 300  mg daily.  She agreed with the plan.  We will try to get the testing results.  Continue Ativan 0.5 mg daily and second if needed, increase Wellbutrin to XL 450 mg daily, Lamictal 150 mg daily and increase hydroxyzine from 10mg  to 25 to 50 mg as needed for insomnia.  Recommended to call back if she is any question or any concern.  Follow-up in 3 months.  Follow Up Instructions:    I discussed the assessment and treatment plan with the patient. The patient was provided an opportunity to ask questions and all were answered. The patient agreed with the plan  and demonstrated an understanding of the instructions.   The patient was advised to call back or seek an in-person evaluation if the symptoms worsen or if the condition fails to improve as anticipated.  I provided 19 minutes of non-face-to-face time during this encounter.   [place], MD

## 2021-02-22 ENCOUNTER — Ambulatory Visit: Payer: No Typology Code available for payment source | Admitting: Family Medicine

## 2021-03-01 ENCOUNTER — Telehealth: Payer: Self-pay | Admitting: *Deleted

## 2021-03-01 NOTE — Telephone Encounter (Signed)
Prior auth started via cover my meds.  Awaiting determination.  Key: [id number]

## 2021-03-01 NOTE — Telephone Encounter (Signed)
This letter is to notify you that your prior authorization request for SAXENDA 18 MG/3 ML PEN has been approved based upon the information we received from your healthcare practitioner. Benefits for all services are subject to terms, conditions and eligibility as outlined in the benefit documentation in effect at the time services are provided. The authorization is effective for a maximum of 12 fill(s) from 03/01/2021 to 02/28/2022, as long as you are enrolled as a member of your current health plan.

## 2021-03-07 ENCOUNTER — Other Ambulatory Visit (HOSPITAL_BASED_OUTPATIENT_CLINIC_OR_DEPARTMENT_OTHER): Payer: Self-pay

## 2021-03-07 ENCOUNTER — Encounter: Payer: Self-pay | Admitting: Family Medicine

## 2021-03-07 ENCOUNTER — Other Ambulatory Visit: Payer: Self-pay

## 2021-03-07 ENCOUNTER — Ambulatory Visit (INDEPENDENT_AMBULATORY_CARE_PROVIDER_SITE_OTHER): Payer: No Typology Code available for payment source | Admitting: Family Medicine

## 2021-03-07 VITALS — BP 120/78 | HR 78 | Temp 98.0°F | Ht 61.0 in | Wt 186.0 lb

## 2021-03-07 DIAGNOSIS — E1169 Type 2 diabetes mellitus with other specified complication: Secondary | ICD-10-CM | POA: Diagnosis not present

## 2021-03-07 DIAGNOSIS — F902 Attention-deficit hyperactivity disorder, combined type: Secondary | ICD-10-CM | POA: Diagnosis not present

## 2021-03-07 DIAGNOSIS — E785 Hyperlipidemia, unspecified: Secondary | ICD-10-CM

## 2021-03-07 LAB — LIPID PANEL
Cholesterol: 171 mg/dL (ref 0–200)
HDL: 48.3 mg/dL (ref >39.00)
LDL Cholesterol: 108 mg/dL — ABNORMAL HIGH (ref 0–99)
NonHDL: 122.57
Total CHOL/HDL Ratio: 4
Triglycerides: 75 mg/dL (ref 0.0–149.0)
VLDL: 15 mg/dL (ref 0.0–40.0)

## 2021-03-07 LAB — COMPREHENSIVE METABOLIC PANEL
ALT: 10 U/L (ref 0–35)
AST: 12 U/L (ref 0–37)
Albumin: 4.1 g/dL (ref 3.5–5.2)
Alkaline Phosphatase: 51 U/L (ref 39–117)
BUN: 8 mg/dL (ref 6–23)
CO2: 28 mEq/L (ref 19–32)
Calcium: 9.3 mg/dL (ref 8.4–10.5)
Chloride: 102 mEq/L (ref 96–112)
Creatinine, Ser: 0.57 mg/dL (ref 0.40–1.20)
GFR: 113.28 mL/min (ref >60.00)
Glucose, Bld: 114 mg/dL — ABNORMAL HIGH (ref 70–99)
Potassium: 4.4 mEq/L (ref 3.5–5.1)
Sodium: 138 mEq/L (ref 135–145)
Total Bilirubin: 0.4 mg/dL (ref 0.2–1.2)
Total Protein: 6.4 g/dL (ref 6.0–8.3)

## 2021-03-07 LAB — CBC
HCT: 29.8 % — ABNORMAL LOW (ref 36.0–46.0)
Hemoglobin: 9.2 g/dL — ABNORMAL LOW (ref 12.0–15.0)
MCHC: 30.8 g/dL (ref 30.0–36.0)
MCV: 60.9 fl — ABNORMAL LOW (ref 78.0–100.0)
Platelets: 476 10*3/uL — ABNORMAL HIGH (ref 150.0–400.0)
RBC: 4.89 Mil/uL (ref 3.87–5.11)
RDW: 19.8 % — ABNORMAL HIGH (ref 11.5–15.5)
WBC: 6.2 10*3/uL (ref 4.0–10.5)

## 2021-03-07 LAB — MICROALBUMIN / CREATININE URINE RATIO
Creatinine,U: 112.2 mg/dL
Microalb Creat Ratio: 0.6 mg/g (ref 0.0–30.0)
Microalb, Ur: <0.7 mg/dL (ref 0.0–1.9)

## 2021-03-07 LAB — HEMOGLOBIN A1C: Hgb A1c MFr Bld: 6 % (ref 4.6–6.5)

## 2021-03-07 MED ORDER — GUANFACINE HCL ER 1 MG PO TB24
1.0000 mg | ORAL_TABLET | Freq: Every day | ORAL | 2 refills | Status: DC
  Filled 2021-03-07: qty 30, 30d supply, fill #0
  Filled 2021-03-27: qty 30, 30d supply, fill #1
  Filled 2021-05-09: qty 30, 30d supply, fill #2

## 2021-03-07 NOTE — Progress Notes (Signed)
Chief Complaint  Patient presents with   ADHD    Martha Shannon is 40 y.o. female here for ADHD evaluation.  Patient has a formal diagnosis with a psychology team and was diagnosed with ADHD combined type, mild.  She is taking Wellbutrin XL 450 mg daily, Vistaril as needed, and Lamictal 150 mg daily for anxiety.  This is prescribed by the psychiatry team.  Her psychiatrist did not want to start her on a stimulant as he worries this could worsen her anxiety.  She has never been on a medication for ADHD before.  Past Medical History:  Diagnosis Date   Anxiety and depression 09/24/2016   Asthma     controlled    Chicken pox    As a child.   Depression    Diabetes mellitus type 2 in obese (HCC) 09/24/2016   Fatty liver    Kidney stones    Migraines    PONV (postoperative nausea and vomiting)    vomiting with epidural during childbirth     BP 120/78   Pulse 78   Temp 98 F (36.7 C) (Oral)   Ht 5\' 1"  (1.549 m)   Wt 186 lb (84.4 kg)   SpO2 99%   BMI 35.14 kg/m  Gen- awake, alert, appearing stated age Lungs- no accessory muscle use Neuro- no facial tics Psych- age appropriate judgment and insight, normal mood and affect  Attention deficit hyperactivity disorder (ADHD), combined type - Plan: guanFACINE (INTUNIV) 1 MG TB24 ER tablet  Type 2 diabetes mellitus with hyperlipidemia (HCC) - Plan: CBC, Comprehensive metabolic panel, Lipid panel, Hemoglobin A1c, Microalbumin / creatinine urine ratio  New, uncontrolled.  Trial Intuniv 1 mg nightly.  As her anxiety improves with the psychiatry team, I would not be opposed to trying a low-dose stimulant in the future. F/u in 1 mo for CPE, check labs today. Pt voiced understanding and agreement to the plan.  [place], DO 03/07/21 9:07 AM

## 2021-03-07 NOTE — Patient Instructions (Signed)
Give [place] 2-3 business days to get the results of your labs back.   Keep the diet clean and stay active.  Let me know if there are any issues with the medication.  Let us know if you need anything.

## 2021-03-10 ENCOUNTER — Other Ambulatory Visit: Payer: Self-pay | Admitting: Family Medicine

## 2021-03-10 ENCOUNTER — Other Ambulatory Visit (HOSPITAL_BASED_OUTPATIENT_CLINIC_OR_DEPARTMENT_OTHER): Payer: Self-pay

## 2021-03-10 MED ORDER — BENZONATATE 100 MG PO CAPS
100.0000 mg | ORAL_CAPSULE | Freq: Three times a day (TID) | ORAL | 0 refills | Status: DC | PRN
  Filled 2021-03-10: qty 30, 10d supply, fill #0

## 2021-03-10 MED ORDER — PREDNISONE 20 MG PO TABS
40.0000 mg | ORAL_TABLET | Freq: Every day | ORAL | 0 refills | Status: AC
  Filled 2021-03-10: qty 10, 5d supply, fill #0

## 2021-03-14 ENCOUNTER — Other Ambulatory Visit: Payer: No Typology Code available for payment source

## 2021-03-15 ENCOUNTER — Other Ambulatory Visit (HOSPITAL_BASED_OUTPATIENT_CLINIC_OR_DEPARTMENT_OTHER): Payer: Self-pay

## 2021-03-17 ENCOUNTER — Other Ambulatory Visit (HOSPITAL_BASED_OUTPATIENT_CLINIC_OR_DEPARTMENT_OTHER): Payer: Self-pay

## 2021-03-27 ENCOUNTER — Other Ambulatory Visit: Payer: Self-pay

## 2021-03-27 ENCOUNTER — Ambulatory Visit
Admission: RE | Admit: 2021-03-27 | Discharge: 2021-03-27 | Disposition: A | Discharge status: Home / Self Care | Payer: No Typology Code available for payment source | Source: Ambulatory Visit | Attending: Family Medicine | Admitting: Family Medicine

## 2021-03-27 ENCOUNTER — Other Ambulatory Visit: Payer: Self-pay | Admitting: Family Medicine

## 2021-03-27 DIAGNOSIS — R928 Other abnormal and inconclusive findings on diagnostic imaging of breast: Secondary | ICD-10-CM

## 2021-03-28 ENCOUNTER — Other Ambulatory Visit (HOSPITAL_BASED_OUTPATIENT_CLINIC_OR_DEPARTMENT_OTHER): Payer: Self-pay

## 2021-03-29 ENCOUNTER — Other Ambulatory Visit (HOSPITAL_BASED_OUTPATIENT_CLINIC_OR_DEPARTMENT_OTHER): Payer: Self-pay

## 2021-03-31 ENCOUNTER — Other Ambulatory Visit (HOSPITAL_BASED_OUTPATIENT_CLINIC_OR_DEPARTMENT_OTHER): Payer: Self-pay

## 2021-04-17 ENCOUNTER — Encounter: Payer: No Typology Code available for payment source | Admitting: Family Medicine

## 2021-05-10 ENCOUNTER — Other Ambulatory Visit (HOSPITAL_BASED_OUTPATIENT_CLINIC_OR_DEPARTMENT_OTHER): Payer: Self-pay

## 2021-05-17 ENCOUNTER — Other Ambulatory Visit: Payer: Self-pay

## 2021-05-17 ENCOUNTER — Other Ambulatory Visit (HOSPITAL_BASED_OUTPATIENT_CLINIC_OR_DEPARTMENT_OTHER): Payer: Self-pay

## 2021-05-17 ENCOUNTER — Telehealth (INDEPENDENT_AMBULATORY_CARE_PROVIDER_SITE_OTHER): Payer: No Typology Code available for payment source | Admitting: Psychiatry

## 2021-05-17 ENCOUNTER — Encounter (HOSPITAL_COMMUNITY): Payer: Self-pay | Admitting: Psychiatry

## 2021-05-17 DIAGNOSIS — F419 Anxiety disorder, unspecified: Secondary | ICD-10-CM | POA: Diagnosis not present

## 2021-05-17 DIAGNOSIS — F331 Major depressive disorder, recurrent, moderate: Secondary | ICD-10-CM | POA: Diagnosis not present

## 2021-05-17 MED ORDER — BUPROPION HCL ER (XL) 300 MG PO TB24
ORAL_TABLET | Freq: Every day | ORAL | 0 refills | Status: DC
  Filled 2021-05-17: qty 90, 90d supply, fill #0

## 2021-05-17 MED ORDER — LORAZEPAM 0.5 MG PO TABS
0.5000 mg | ORAL_TABLET | Freq: Every day | ORAL | 0 refills | Status: DC | PRN
Start: 2021-05-17 — End: 2021-09-09
  Filled 2021-05-17: qty 45, 45d supply, fill #0

## 2021-05-17 MED ORDER — HYDROXYZINE PAMOATE 25 MG PO CAPS
25.0000 mg | ORAL_CAPSULE | Freq: Every evening | ORAL | 0 refills | Status: DC | PRN
  Filled 2021-05-17: qty 90, 90d supply, fill #0

## 2021-05-17 MED ORDER — BUPROPION HCL ER (XL) 150 MG PO TB24
150.0000 mg | ORAL_TABLET | Freq: Every day | ORAL | 0 refills | Status: DC
  Filled 2021-05-17: qty 90, 90d supply, fill #0

## 2021-05-17 NOTE — Progress Notes (Signed)
Virtual Visit via Telephone Note  I connected with Martha Shannon on 05/17/21 at  2:20 PM EDT by telephone and verified that I am speaking with the correct person using two identifiers.  Location: Patient: In Car Provider: Home Office   I discussed the limitations, risks, security and privacy concerns of performing an evaluation and management service by telephone and the availability of in person appointments. I also discussed with the patient that there may be a patient responsible charge related to this service. The patient expressed understanding and agreed to proceed.   History of Present Illness: Patient is evaluated by phone session.  On the last visit we increased the Wellbutrin to 450 mg a day.  She was complaining of struggling with attention, focus and difficulty completing her task.  Her PCP also started her on Intuniv.  She noticed improvement in her mood, depression, anxiety and she feels much better.  However she started to have tremors and shakes in her hand.  She is concerned about these shakes.  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.  She reported job sometimes stressful but she is happy lately things are manageable.  She is taking Ativan but cut down from the past and usually takes 1-2 times a week.  She takes hydroxyzine at bedtime that is helping her sleep and anxiety.  Recently she had a blood work and her hemoglobin A1c is 6 and her cholesterol is improved from the past.  Besides the tremors and sometimes itching she has no concern about the medication.  Past Psychiatric History: H/O depression anxiety since age 31.  No h/o inpatient treatment, suicidal attempt, psychosis, abuse.  PCP tried Lexapro that stop working after 1 year, Abilify make mood worst. Buspar did not work, Seroquel and Zoloft did not work.  Klonopin, Xanax and gabapentin did not work.  Recent Results (from the past 2160 hour(s))  CBC     Status: Abnormal   Collection Time: 03/07/21   8:18 AM  Result Value Ref Range   WBC 6.2 4.0 - 10.5 K/uL   RBC 4.89 3.87 - 5.11 Mil/uL   Platelets 476.0 (H) 150.0 - 400.0 K/uL   Hemoglobin 9.2 (L) 12.0 - 15.0 g/dL   HCT 81.8 (L) 56.3 - 14.9 %   MCV 60.9 (L) 78.0 - 100.0 fl    Comment: Rechecked and verified result.   MCHC 30.8 30.0 - 36.0 g/dL   RDW 70.2 (H) 63.7 - 85.8 %  Comprehensive metabolic panel     Status: Abnormal   Collection Time: 03/07/21  8:18 AM  Result Value Ref Range   Sodium 138 135 - 145 mEq/L   Potassium 4.4 3.5 - 5.1 mEq/L   Chloride 102 96 - 112 mEq/L   CO2 28 19 - 32 mEq/L   Glucose, Bld 114 (H) 70 - 99 mg/dL   BUN 8 6 - 23 mg/dL   Creatinine, Ser 8.50 0.40 - 1.20 mg/dL   Total Bilirubin 0.4 0.2 - 1.2 mg/dL   Alkaline Phosphatase 51 39 - 117 U/L   AST 12 0 - 37 U/L   ALT 10 0 - 35 U/L   Total Protein 6.4 6.0 - 8.3 g/dL   Albumin 4.1 3.5 - 5.2 g/dL   GFR 277.41 >28.78 mL/min    Comment: Calculated using the CKD-EPI Creatinine Equation (2021)   Calcium 9.3 8.4 - 10.5 mg/dL  Lipid panel     Status: Abnormal   Collection Time: 03/07/21  8:18 AM  Result  Value Ref Range   Cholesterol 171 0 - 200 mg/dL    Comment: ATP III Classification       Desirable:  < 200 mg/dL               Borderline High:  200 - 239 mg/dL          High:  > = 194 mg/dL   Triglycerides 17.4 0.0 - 149.0 mg/dL    Comment: Normal:  <081 mg/dLBorderline High:  150 - 199 mg/dL   HDL 44.81 >85.63 mg/dL   VLDL 14.9 0.0 - 70.2 mg/dL   LDL Cholesterol 637 (H) 0 - 99 mg/dL   Total CHOL/HDL Ratio 4     Comment:                Men          Women1/2 Average Risk     3.4          3.3Average Risk          5.0          4.42X Average Risk          9.6          7.13X Average Risk          15.0          11.0                       NonHDL 122.57     Comment: NOTE:  Non-HDL goal should be 30 mg/dL higher than patient's LDL goal (i.e. LDL goal of < 70 mg/dL, would have non-HDL goal of < 100 mg/dL)  Hemoglobin C5Y     Status: None   Collection Time:  03/07/21  8:18 AM  Result Value Ref Range   Hgb A1c MFr Bld 6.0 4.6 - 6.5 %    Comment: Glycemic Control Guidelines for People with Diabetes:Non Diabetic:  <6%Goal of Therapy: <7%Additional Action Suggested:  >8%   Microalbumin / creatinine urine ratio     Status: None   Collection Time: 03/07/21  8:18 AM  Result Value Ref Range   Microalb, Ur <0.7 0.0 - 1.9 mg/dL   Creatinine,U 850.2 mg/dL   Microalb Creat Ratio 0.6 0.0 - 30.0 mg/g      Psychiatric Specialty Exam: Physical Exam  Review of Systems  Weight 186 lb (84.4 kg).There is no height or weight on file to calculate BMI.  General Appearance: NA  Eye Contact:  NA  Speech:  Clear and Coherent  Volume:  Normal  Mood:  Euthymic  Affect:  NA  Thought Process:  Goal Directed  Orientation:  Full (Time, Place, and Person)  Thought Content:  Logical  Suicidal Thoughts:  No  Homicidal Thoughts:  No  Memory:  Immediate;   Good Recent;   Good Remote;   Good  Judgement:  Intact  Insight:  Good  Psychomotor Activity:  NA  Concentration:  Concentration: Good and Attention Span: Good  Recall:  Good  Fund of Knowledge:  Good  Language:  Good  Akathisia:  No  Handed:  Right  AIMS (if indicated):     Assets:  Communication Skills Desire for Improvement Housing Resilience Talents/Skills Transportation  ADL's:  Intact  Cognition:  WNL  Sleep:   good with hydroxyzine      Assessment and Plan: Major depressive disorder, recurrent.  Anxiety.  I reviewed her current medication and blood work results.  She is now taking  Intuniv prescribed by PCP which is helping her attention and focus.  I recommend she should go back to Wellbutrin only 300 since extra 150 may be contributing to tremors.  She agreed to give a try.  However I recommend if she noticed that her depression and anxiety getting worse then she should call us back.  Continue Ativan 0.5 mg to take as needed for severe anxiety.  She usually takes 1-2 times per week.  Continue  hydroxyzine 25 mg at bedtime and lamotrigine 150 mg every morning.  Recommended to call us back if she has any question or any concern.  Follow-up in 3 months.  Follow Up Instructions:    I discussed the assessment and treatment plan with the patient. The patient was provided an opportunity to ask questions and all were answered. The patient agreed with the plan and demonstrated an understanding of the instructions.   The patient was advised to call back or seek an in-person evaluation if the symptoms worsen or if the condition fails to improve as anticipated.  I provided 19 minutes of non-face-to-face time during this encounter.   Cleotis Nipper, MD

## 2021-06-06 ENCOUNTER — Other Ambulatory Visit: Payer: Self-pay | Admitting: Family Medicine

## 2021-06-06 DIAGNOSIS — F902 Attention-deficit hyperactivity disorder, combined type: Secondary | ICD-10-CM

## 2021-06-07 ENCOUNTER — Other Ambulatory Visit (HOSPITAL_BASED_OUTPATIENT_CLINIC_OR_DEPARTMENT_OTHER): Payer: Self-pay

## 2021-06-15 ENCOUNTER — Encounter: Payer: Self-pay | Admitting: Family Medicine

## 2021-06-15 ENCOUNTER — Other Ambulatory Visit (HOSPITAL_BASED_OUTPATIENT_CLINIC_OR_DEPARTMENT_OTHER): Payer: Self-pay

## 2021-06-15 ENCOUNTER — Other Ambulatory Visit: Payer: Self-pay

## 2021-06-15 ENCOUNTER — Ambulatory Visit: Payer: No Typology Code available for payment source | Admitting: Family Medicine

## 2021-06-15 ENCOUNTER — Ambulatory Visit (INDEPENDENT_AMBULATORY_CARE_PROVIDER_SITE_OTHER): Payer: No Typology Code available for payment source | Admitting: Family Medicine

## 2021-06-15 VITALS — BP 108/78 | HR 88 | Temp 98.1°F | Resp 18 | Ht 61.0 in | Wt 184.8 lb

## 2021-06-15 DIAGNOSIS — F331 Major depressive disorder, recurrent, moderate: Secondary | ICD-10-CM | POA: Diagnosis not present

## 2021-06-15 DIAGNOSIS — Z0000 Encounter for general adult medical examination without abnormal findings: Secondary | ICD-10-CM | POA: Diagnosis not present

## 2021-06-15 DIAGNOSIS — F902 Attention-deficit hyperactivity disorder, combined type: Secondary | ICD-10-CM

## 2021-06-15 DIAGNOSIS — E119 Type 2 diabetes mellitus without complications: Secondary | ICD-10-CM

## 2021-06-15 DIAGNOSIS — F419 Anxiety disorder, unspecified: Secondary | ICD-10-CM | POA: Diagnosis not present

## 2021-06-15 LAB — COMPREHENSIVE METABOLIC PANEL
ALT: 10 U/L (ref 0–35)
AST: 11 U/L (ref 0–37)
Albumin: 4.2 g/dL (ref 3.5–5.2)
Alkaline Phosphatase: 49 U/L (ref 39–117)
BUN: 8 mg/dL (ref 6–23)
CO2: 29 mEq/L (ref 19–32)
Calcium: 9.5 mg/dL (ref 8.4–10.5)
Chloride: 103 mEq/L (ref 96–112)
Creatinine, Ser: 0.68 mg/dL (ref 0.40–1.20)
GFR: 108.35 mL/min (ref >60.00)
Glucose, Bld: 74 mg/dL (ref 70–99)
Potassium: 4 mEq/L (ref 3.5–5.1)
Sodium: 140 mEq/L (ref 135–145)
Total Bilirubin: 0.4 mg/dL (ref 0.2–1.2)
Total Protein: 6.7 g/dL (ref 6.0–8.3)

## 2021-06-15 LAB — LIPID PANEL
Cholesterol: 187 mg/dL (ref 0–200)
HDL: 54.1 mg/dL (ref >39.00)
LDL Cholesterol: 120 mg/dL — ABNORMAL HIGH (ref 0–99)
NonHDL: 132.79
Total CHOL/HDL Ratio: 3
Triglycerides: 66 mg/dL (ref 0.0–149.0)
VLDL: 13.2 mg/dL (ref 0.0–40.0)

## 2021-06-15 MED ORDER — GUANFACINE HCL ER 2 MG PO TB24
2.0000 mg | ORAL_TABLET | Freq: Every day | ORAL | 2 refills | Status: DC
  Filled 2021-06-15: qty 30, 30d supply, fill #0
  Filled 2021-07-16: qty 30, 30d supply, fill #1
  Filled 2021-08-21: qty 30, 30d supply, fill #2

## 2021-06-15 NOTE — Progress Notes (Signed)
Chief Complaint  Patient presents with   ADHD     Well Woman Martha Shannon is here for a complete physical.   Her last physical was >1 year ago.  Current diet: in general, a "healthy" diet. Current exercise: cardio, lifting weights. Weight is stable and she denies fatigue out of ordinary. Seatbelt? Yes  Health Maintenance Pap/HPV- Yes Mammogram- Yes Tetanus- Yes Hep C screening- Yes HIV screening- Yes  ADHD The patient was doing well with her concentration on guanfacine 1 mg nightly.  She reports compliance without adverse effects.  She was also on Wellbutrin XL 300 mg daily.  Her concentration started to suffer in addition to her depression.  She has a follow-up with her psychiatrist in around 6 weeks.  There is no triggering event.  No homicidal or suicidal ideation.  She was following with a counselor but cost became an issue.  Past Medical History:  Diagnosis Date   Anxiety and depression 09/24/2016   Asthma     controlled    Chicken pox    As a child.   Depression    Diabetes mellitus type 2 in obese (HCC) 09/24/2016   Fatty liver    Kidney stones    Migraines    PONV (postoperative nausea and vomiting)    vomiting with epidural during childbirth      Past Surgical History:  Procedure Laterality Date   CESAREAN SECTION     2006.   LAPAROSCOPIC GASTRIC SLEEVE RESECTION N/A 04/28/2019   Procedure: LAPAROSCOPIC GASTRIC SLEEVE RESECTION, Upper Endo, ERAS Pathway;  Surgeon: Kinsinger, De Blanch, MD;  Location: [facility];  Service: General;  Laterality: N/A;    Medications  Current Outpatient Medications on File Prior to Visit  Medication Sig Dispense Refill   buPROPion (WELLBUTRIN XL) 300 MG 24 hr tablet TAKE 1 TABLET BY MOUTH ONCE DAILY 90 tablet 0   guanFACINE (INTUNIV) 1 MG TB24 ER tablet Take 1 tablet (1 mg total) by mouth daily. 30 tablet 2   hydrOXYzine (VISTARIL) 25 MG capsule Take 1 capsule (25 mg total) by mouth at bedtime as needed for anxiety or itching  (sleep). 90 capsule 0   Insulin Pen Needle 32G X 6 MM MISC USE T0 INJECT SAXENDA 100 each 0   lamoTRIgine (LAMICTAL) 150 MG tablet TAKE 1 TABLET BY MOUTH IN THE MORNING 90 tablet 0   LORazepam (ATIVAN) 0.5 MG tablet Take 1 tablet (0.5 mg total) by mouth daily as needed for anxiety. 45 tablet 0   buPROPion (WELLBUTRIN XL) 150 MG 24 hr tablet Take 1 tablet (150 mg total) by mouth daily. (Patient not taking: No sig reported) 90 tablet 0   [DISCONTINUED] phentermine (ADIPEX-P) 37.5 MG tablet Take 1 tablet (37.5 mg total) by mouth daily before breakfast. 30 tablet 0   [DISCONTINUED] QUEtiapine (SEROQUEL) 50 MG tablet Take 1 tablet (50 mg total) by mouth at bedtime. (Patient not taking: Reported on 05/09/2020) 30 tablet 2   [DISCONTINUED] sertraline (ZOLOFT) 100 MG tablet TAKE 1 TABLET BY MOUTH ONCE DAILY 90 tablet 2   [DISCONTINUED] traZODone (DESYREL) 50 MG tablet TAKE 1/2-1 TABLET (25-50 MG TOTAL) BY MOUTH AT BEDTIME AS NEEDED FOR SLEEP. 30 tablet 3    Allergies Allergies  Allergen Reactions   Naproxen     Legs felt agitated, and restleness     Review of Systems: Constitutional:  no unexpected weight changes Eye:  no recent significant change in vision Ear/Nose/Mouth/Throat:  Ears:  no recent change in hearing Nose/Mouth/Throat:  no complaints of nasal congestion, no sore throat Cardiovascular: no chest pain Respiratory:  no shortness of breath Gastrointestinal:  no abdominal pain, no change in bowel habits GU:  Female: negative for dysuria or pelvic pain Musculoskeletal/Extremities:  no pain of the joints Integumentary (Skin/Breast):  no abnormal skin lesions reported Neurologic:  no headaches Psych: + Depression Hematologic/Lymphatic:  No areas of easy bleeding  Exam BP 108/78 (BP Location: Left Arm, Patient Position: Sitting, Cuff Size: Large)   Pulse 88   Temp 98.1 F (36.7 C) (Oral)   Resp 18   Ht 5\' 1"  (1.549 m)   Wt 184 lb 12.8 oz (83.8 kg)   SpO2 97%   BMI 34.92 kg/m   General:  well developed, well nourished, in no apparent distress Skin:  no significant moles, warts, or growths Head:  no masses, lesions, or tenderness Eyes:  pupils equal and round, sclera anicteric without injection Ears:  canals without lesions, TMs shiny without retraction, no obvious effusion, no erythema Nose:  nares patent, septum midline, mucosa normal, and no drainage or sinus tenderness Throat/Pharynx:  lips and gingiva without lesion; tongue and uvula midline; non-inflamed pharynx; no exudates or postnasal drainage Neck: neck supple without adenopathy, thyromegaly, or masses Lungs:  clear to auscultation, breath sounds equal bilaterally, no respiratory distress Cardio:  regular rate and rhythm, no LE edema Abdomen:  abdomen soft, nontender; bowel sounds normal; no masses or organomegaly Genital: Defer to GYN Musculoskeletal:  symmetrical muscle groups noted without atrophy or deformity Extremities:  no clubbing, cyanosis, or edema, no deformities, no skin discoloration Neuro:  gait normal; deep tendon reflexes normal and symmetric Psych: well oriented with normal range of affect and appropriate judgment/insight, did become tearful during the exam  Assessment and Plan  Well adult exam - Plan: Comprehensive metabolic panel, Lipid panel  Attention deficit hyperactivity disorder (ADHD), combined type  Anxiety  MDD (major depressive disorder), recurrent episode, moderate (HCC)  Type 2 diabetes mellitus in remission [facility])   Well 40 y.o. female. Counseled on diet and exercise. Other orders as above. Patient's diabetes is in remission as her A1c has been less than 6.5 off of hypoglycemics for over 3 months.  We will hold off on statins.  We will monitor A1c every 6 months ago. ADHD: Chronic, uncontrolled.  This could be related to uncontrolled depression which she is reaching out to her psychiatrist regarding.  We will increase her guanfacine from 1 mg nightly to 2 mg nightly.   Follow-up pending depression treatment. Follow up in 6 mo or prn. The patient voiced understanding and agreement to the plan.  46 Kohler, DO 06/15/21 12:02 PM

## 2021-06-15 NOTE — Patient Instructions (Addendum)
Give [place] 2-3 business days to get the results of your labs back.   Keep the diet clean and stay active.  Consider [email or web address] for workout ideas.  I recommend getting the updated bivalent covid vaccination booster at your convenience.   Let us know if you need anything.

## 2021-06-16 NOTE — Telephone Encounter (Signed)
Forms faxed in   Placed in wendling bin up front

## 2021-07-17 ENCOUNTER — Other Ambulatory Visit (HOSPITAL_BASED_OUTPATIENT_CLINIC_OR_DEPARTMENT_OTHER): Payer: Self-pay

## 2021-07-21 ENCOUNTER — Other Ambulatory Visit (HOSPITAL_COMMUNITY): Payer: Self-pay

## 2021-07-24 ENCOUNTER — Other Ambulatory Visit (HOSPITAL_COMMUNITY): Payer: Self-pay | Admitting: Psychiatry

## 2021-07-24 DIAGNOSIS — F419 Anxiety disorder, unspecified: Secondary | ICD-10-CM

## 2021-07-24 DIAGNOSIS — F331 Major depressive disorder, recurrent, moderate: Secondary | ICD-10-CM

## 2021-07-25 ENCOUNTER — Other Ambulatory Visit (HOSPITAL_BASED_OUTPATIENT_CLINIC_OR_DEPARTMENT_OTHER): Payer: Self-pay

## 2021-08-01 ENCOUNTER — Telehealth (HOSPITAL_COMMUNITY): Payer: No Typology Code available for payment source | Admitting: Psychiatry

## 2021-08-09 ENCOUNTER — Other Ambulatory Visit (HOSPITAL_COMMUNITY): Payer: Self-pay | Admitting: Psychiatry

## 2021-08-09 DIAGNOSIS — F331 Major depressive disorder, recurrent, moderate: Secondary | ICD-10-CM

## 2021-08-09 DIAGNOSIS — F419 Anxiety disorder, unspecified: Secondary | ICD-10-CM

## 2021-08-10 ENCOUNTER — Other Ambulatory Visit (HOSPITAL_BASED_OUTPATIENT_CLINIC_OR_DEPARTMENT_OTHER): Payer: Self-pay

## 2021-08-15 ENCOUNTER — Other Ambulatory Visit (HOSPITAL_BASED_OUTPATIENT_CLINIC_OR_DEPARTMENT_OTHER): Payer: Self-pay

## 2021-08-16 ENCOUNTER — Other Ambulatory Visit (HOSPITAL_BASED_OUTPATIENT_CLINIC_OR_DEPARTMENT_OTHER): Payer: Self-pay

## 2021-08-22 ENCOUNTER — Other Ambulatory Visit (HOSPITAL_BASED_OUTPATIENT_CLINIC_OR_DEPARTMENT_OTHER): Payer: Self-pay

## 2021-08-23 ENCOUNTER — Other Ambulatory Visit (HOSPITAL_BASED_OUTPATIENT_CLINIC_OR_DEPARTMENT_OTHER): Payer: Self-pay

## 2021-08-29 ENCOUNTER — Other Ambulatory Visit (HOSPITAL_COMMUNITY): Payer: Self-pay | Admitting: Psychiatry

## 2021-08-29 ENCOUNTER — Other Ambulatory Visit (HOSPITAL_BASED_OUTPATIENT_CLINIC_OR_DEPARTMENT_OTHER): Payer: Self-pay

## 2021-08-29 DIAGNOSIS — F331 Major depressive disorder, recurrent, moderate: Secondary | ICD-10-CM

## 2021-08-29 DIAGNOSIS — F419 Anxiety disorder, unspecified: Secondary | ICD-10-CM

## 2021-09-04 ENCOUNTER — Other Ambulatory Visit (HOSPITAL_BASED_OUTPATIENT_CLINIC_OR_DEPARTMENT_OTHER): Payer: Self-pay

## 2021-09-09 ENCOUNTER — Other Ambulatory Visit (HOSPITAL_COMMUNITY): Payer: Self-pay | Admitting: Psychiatry

## 2021-09-09 DIAGNOSIS — F419 Anxiety disorder, unspecified: Secondary | ICD-10-CM

## 2021-09-09 DIAGNOSIS — F331 Major depressive disorder, recurrent, moderate: Secondary | ICD-10-CM

## 2021-09-11 ENCOUNTER — Other Ambulatory Visit (HOSPITAL_BASED_OUTPATIENT_CLINIC_OR_DEPARTMENT_OTHER): Payer: Self-pay

## 2021-09-12 ENCOUNTER — Other Ambulatory Visit (HOSPITAL_BASED_OUTPATIENT_CLINIC_OR_DEPARTMENT_OTHER): Payer: Self-pay

## 2021-09-13 ENCOUNTER — Other Ambulatory Visit: Payer: Self-pay

## 2021-09-13 ENCOUNTER — Telehealth (HOSPITAL_COMMUNITY): Payer: No Typology Code available for payment source | Admitting: Psychiatry

## 2021-09-14 ENCOUNTER — Other Ambulatory Visit (HOSPITAL_BASED_OUTPATIENT_CLINIC_OR_DEPARTMENT_OTHER): Payer: Self-pay

## 2021-09-14 MED ORDER — LAMOTRIGINE 150 MG PO TABS
ORAL_TABLET | Freq: Every morning | ORAL | 0 refills | Status: DC
  Filled 2021-09-14: qty 30, 30d supply, fill #0

## 2021-09-14 MED ORDER — LORAZEPAM 0.5 MG PO TABS
0.5000 mg | ORAL_TABLET | Freq: Every day | ORAL | 0 refills | Status: DC | PRN
  Filled 2021-09-14: qty 15, 15d supply, fill #0

## 2021-09-14 MED ORDER — BUPROPION HCL ER (XL) 300 MG PO TB24
ORAL_TABLET | Freq: Every day | ORAL | 0 refills | Status: DC
  Filled 2021-09-14: qty 30, 30d supply, fill #0

## 2021-09-14 MED ORDER — HYDROXYZINE PAMOATE 25 MG PO CAPS
25.0000 mg | ORAL_CAPSULE | Freq: Every evening | ORAL | 0 refills | Status: DC | PRN
  Filled 2021-09-14: qty 20, 20d supply, fill #0

## 2021-09-27 ENCOUNTER — Encounter (HOSPITAL_COMMUNITY): Payer: Self-pay | Admitting: Psychiatry

## 2021-09-27 ENCOUNTER — Other Ambulatory Visit: Payer: Self-pay

## 2021-09-27 ENCOUNTER — Telehealth (HOSPITAL_BASED_OUTPATIENT_CLINIC_OR_DEPARTMENT_OTHER): Payer: No Typology Code available for payment source | Admitting: Psychiatry

## 2021-09-27 ENCOUNTER — Other Ambulatory Visit (HOSPITAL_BASED_OUTPATIENT_CLINIC_OR_DEPARTMENT_OTHER): Payer: Self-pay

## 2021-09-27 ENCOUNTER — Other Ambulatory Visit: Payer: Self-pay | Admitting: Family Medicine

## 2021-09-27 DIAGNOSIS — F331 Major depressive disorder, recurrent, moderate: Secondary | ICD-10-CM

## 2021-09-27 DIAGNOSIS — F419 Anxiety disorder, unspecified: Secondary | ICD-10-CM

## 2021-09-27 MED ORDER — GUANFACINE HCL ER 2 MG PO TB24
2.0000 mg | ORAL_TABLET | Freq: Every day | ORAL | 0 refills | Status: DC
  Filled 2021-09-27: qty 30, 30d supply, fill #0

## 2021-09-27 MED ORDER — BUPROPION HCL ER (XL) 300 MG PO TB24
ORAL_TABLET | Freq: Every day | ORAL | 0 refills | Status: DC
  Filled 2021-09-27 – 2021-10-19 (×2): qty 90, 90d supply, fill #0

## 2021-09-27 MED ORDER — LORAZEPAM 0.5 MG PO TABS
0.5000 mg | ORAL_TABLET | Freq: Every day | ORAL | 0 refills | Status: AC | PRN
  Filled 2021-09-27: qty 20, 20d supply, fill #0

## 2021-09-27 MED ORDER — HYDROXYZINE PAMOATE 25 MG PO CAPS
25.0000 mg | ORAL_CAPSULE | Freq: Every evening | ORAL | 0 refills | Status: DC | PRN
  Filled 2021-09-27 – 2021-10-19 (×2): qty 180, 60d supply, fill #0

## 2021-09-27 MED ORDER — LAMOTRIGINE 150 MG PO TABS
ORAL_TABLET | Freq: Every morning | ORAL | 0 refills | Status: DC
  Filled 2021-09-27 – 2021-10-19 (×2): qty 90, 90d supply, fill #0

## 2021-09-27 NOTE — Progress Notes (Signed)
Virtual Visit via Telephone Note  I connected with Martha Shannon on 09/27/21 at  2:00 PM EST by telephone and verified that I am speaking with the correct person using two identifiers.  Location: Patient: Home Provider: Home Office   I discussed the limitations, risks, security and privacy concerns of performing an evaluation and management service by telephone and the availability of in person appointments. I also discussed with the patient that there may be a patient responsible charge related to this service. The patient expressed understanding and agreed to proceed.   History of Present Illness: Patient is evaluated by phone session.  She missed her last appointment because her 30 year old father died in a nursing home in [place].  She was very sad about the loss of her father.  She went to [place] and stayed for a week.  She endorses still going through grief and having crying spells and dysphoria but does not feel she needed any grief counseling.  She also started a new work but is actually transferred from 1 department to another department.  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  She is not sure if she will continue to stay there because she is not enjoying the work.  She is taking hydroxyzine 50 mg along with Intuniv and 3-4 times a week takes Ativan to go to sleep.  She still struggles some nights with sleep and having racing thoughts.  Since she cut down her Wellbutrin her shakes are gone.  She denies any paranoia, suicidal thoughts.  She is compliant with Lamictal 150 mg every morning and denies any rash or any itching.  Patient lives with her husband and 51 year old daughter.  She started walking every day with her daughter and that helps.  She also lost few pounds since the last visit.  She took a few days for bereavement.  She denies any panic attack but endorses ruminative and anxiety.  Past Psychiatric History: H/O depression anxiety since  age 41.  No h/o inpatient treatment, suicidal attempt, psychosis, abuse.  PCP tried Lexapro that stop working after 1 year, Abilify make mood worst. Buspar did not work, Seroquel and Zoloft did not work.  Klonopin, Xanax and gabapentin did not work.  Psychiatric Specialty Exam: Physical Exam  Review of Systems  Weight 179 lb (81.2 kg).There is no height or weight on file to calculate BMI.  General Appearance: NA  Eye Contact:  NA  Speech:  Slow  Volume:  Normal  Mood:  Dysphoric  Affect:  NA  Thought Process:  Goal Directed  Orientation:  Full (Time, Place, and Person)  Thought Content:  WDL  Suicidal Thoughts:  No  Homicidal Thoughts:  No  Memory:  Immediate;   Good Recent;   Good Remote;   Good  Judgement:  Intact  Insight:  Present  Psychomotor Activity:  NA  Concentration:  Concentration: Good and Attention Span: Good  Recall:  Good  Fund of Knowledge:  Good  Language:  Good  Akathisia:  No  Handed:  Right  AIMS (if indicated):     Assets:  Communication Skills Desire for Improvement Housing Resilience Social Support Transportation  ADL's:  Intact  Cognition:  WNL  Sleep:   fair      Assessment and Plan: Major depressive disorder, recurrent.  Anxiety.  She cut down her Wellbutrin to only 300 and that helps her shakes.  We talk about recent loss of her father and I  offered grief counseling but patient denied and she felt okay without counseling.  She agreed if needed then she can contact us.  We talk about her insomnia and I recommend she can take hydroxyzine up to 75 mg at bedtime and try to cut down her Ativan to take only as needed for severe anxiety due to potential dependency.  She agreed with the plan.  She is getting Intuniv from her PCP to help her ADHD and she will contact him for the refills.  We will continue Lamictal 150 mg daily, Wellbutrin XL 300 mg daily and she will try hydroxyzine 25 mg-75 at bedtime and take the Ativan only as a for severe anxiety.   Encouraged to continue walking as patient feeling better with a walker.  Recommended to call us back if she is any question or any concern.  Follow-up in 3 months.  Follow Up Instructions:    I discussed the assessment and treatment plan with the patient. The patient was provided an opportunity to ask questions and all were answered. The patient agreed with the plan and demonstrated an understanding of the instructions.   The patient was advised to call back or seek an in-person evaluation if the symptoms worsen or if the condition fails to improve as anticipated.  I provided 27 minutes of non-face-to-face time during this encounter.   Kathlee Nations, MD

## 2021-10-19 ENCOUNTER — Other Ambulatory Visit (HOSPITAL_BASED_OUTPATIENT_CLINIC_OR_DEPARTMENT_OTHER): Payer: Self-pay

## 2021-11-07 ENCOUNTER — Other Ambulatory Visit: Payer: Self-pay | Admitting: Family Medicine

## 2021-11-07 ENCOUNTER — Other Ambulatory Visit (HOSPITAL_BASED_OUTPATIENT_CLINIC_OR_DEPARTMENT_OTHER): Payer: Self-pay

## 2021-11-07 MED ORDER — GUANFACINE HCL ER 2 MG PO TB24
2.0000 mg | ORAL_TABLET | Freq: Every day | ORAL | 1 refills | Status: DC
  Filled 2021-11-07 (×2): qty 90, 90d supply, fill #0
  Filled 2022-02-02: qty 90, 90d supply, fill #1

## 2021-11-08 ENCOUNTER — Other Ambulatory Visit (HOSPITAL_BASED_OUTPATIENT_CLINIC_OR_DEPARTMENT_OTHER): Payer: Self-pay

## 2021-11-23 ENCOUNTER — Encounter (HOSPITAL_COMMUNITY): Payer: Self-pay | Admitting: *Deleted

## 2021-12-27 ENCOUNTER — Telehealth (HOSPITAL_COMMUNITY): Payer: No Typology Code available for payment source | Admitting: Psychiatry

## 2022-01-01 ENCOUNTER — Encounter (HOSPITAL_COMMUNITY): Payer: Self-pay | Admitting: Psychiatry

## 2022-01-01 ENCOUNTER — Telehealth (HOSPITAL_BASED_OUTPATIENT_CLINIC_OR_DEPARTMENT_OTHER): Payer: No Typology Code available for payment source | Admitting: Psychiatry

## 2022-01-01 ENCOUNTER — Other Ambulatory Visit (HOSPITAL_BASED_OUTPATIENT_CLINIC_OR_DEPARTMENT_OTHER): Payer: Self-pay

## 2022-01-01 VITALS — Wt 183.0 lb

## 2022-01-01 DIAGNOSIS — F331 Major depressive disorder, recurrent, moderate: Secondary | ICD-10-CM

## 2022-01-01 DIAGNOSIS — F419 Anxiety disorder, unspecified: Secondary | ICD-10-CM | POA: Diagnosis not present

## 2022-01-01 DIAGNOSIS — F902 Attention-deficit hyperactivity disorder, combined type: Secondary | ICD-10-CM | POA: Diagnosis not present

## 2022-01-01 MED ORDER — BUPROPION HCL ER (XL) 300 MG PO TB24
ORAL_TABLET | Freq: Every day | ORAL | 0 refills | Status: DC
  Filled 2022-01-01: qty 90, 90d supply, fill #0

## 2022-01-01 MED ORDER — HYDROXYZINE PAMOATE 25 MG PO CAPS
25.0000 mg | ORAL_CAPSULE | Freq: Every evening | ORAL | 0 refills | Status: DC | PRN
  Filled 2022-01-01: qty 180, 60d supply, fill #0

## 2022-01-01 MED ORDER — RISPERIDONE 0.25 MG PO TABS
0.2500 mg | ORAL_TABLET | Freq: Every day | ORAL | 0 refills | Status: DC
  Filled 2022-01-01: qty 60, 30d supply, fill #0

## 2022-01-01 MED ORDER — LAMOTRIGINE 150 MG PO TABS
ORAL_TABLET | Freq: Every morning | ORAL | 0 refills | Status: DC
  Filled 2022-01-01: qty 90, 90d supply, fill #0

## 2022-01-01 NOTE — Progress Notes (Signed)
Virtual Visit via Telephone Note ? ?I connected with Martha Shannon on 01/01/22 at  4:20 PM EDT by telephone and verified that I am speaking with the correct person using two identifiers. ? ?Location: ?Patient: In Car ?Provider: Home Office ?  ?I discussed the limitations, risks, security and privacy concerns of performing an evaluation and management service by telephone and the availability of in person appointments. I also discussed with the patient that there may be a patient responsible charge related to this service. The patient expressed understanding and agreed to proceed. ? ? ?History of Present Illness: ?Patient is evaluated by phone session.  She is taking Lamictal, Wellbutrin and feeling her depression and anxiety is better however she continued to endorse paranoia and does not feel comfortable around people.  She feels people are looking at her and thinking about her.  Her sleep is okay.  She also takes Intuniv for ADHD and she feels it does not help because she has struggles staying on her task.  She jumped from one topic to one topic.  Her job is overwhelming but she is pleased because it is close to house.  She has more time to spend with the family.  Today she is picking up her daughter from the school.  She is also taking hydroxyzine 75 mg at bedtime.  She tried to cut down but could not sleep.  She also takes Ativan but overall has cut down the Ativan from the past.  Her biggest concern is paranoia.  Patient lives with her husband and 77 year old daughter.  In the past she had tried Seroquel and Abilify but it did not work.  She also tried Lexapro and Zoloft.  She admitted sometime crying spells.  However denies any suicidal thoughts or homicidal thoughts.  Her appetite is okay.  She gained a few pounds but started to exercise every day and walking with her daughter in the evening. ? ? ?Past Psychiatric History: ?H/O depression anxiety since age 56.  No h/o inpatient treatment, suicidal  attempt, psychosis, abuse.  PCP tried Lexapro that stop working after 1 year, Abilify make mood worst. Buspar did not work, Seroquel and Zoloft did not work.  Klonopin, Xanax and gabapentin did not work. ?  ?Psychiatric Specialty Exam: ?Physical Exam  ?Review of Systems  ?Weight 183 lb (83 kg).There is no height or weight on file to calculate BMI.  ?General Appearance: NA  ?Eye Contact:  NA  ?Speech:  Clear and Coherent and Normal Rate  ?Volume:  Normal  ?Mood:  Dysphoric  ?Affect:  NA  ?Thought Process:  Goal Directed  ?Orientation:  Full (Time, Place, and Person)  ?Thought Content:  Paranoid Ideation  ?Suicidal Thoughts:  No  ?Homicidal Thoughts:  No  ?Memory:  Immediate;   Good ?Recent;   Good ?Remote;   Good  ?Judgement:  Good  ?Insight:  Good  ?Psychomotor Activity:  NA  ?Concentration:  Concentration: Good and Attention Span: Good  ?Recall:  Good  ?Fund of Knowledge:  Good  ?Language:  Good  ?Akathisia:  No  ?Handed:  Right  ?AIMS (if indicated):     ?Assets:  Communication Skills ?Desire for Improvement ?Housing ?Talents/Skills ?Transportation  ?ADL's:  Intact  ?Cognition:  WNL  ?Sleep:   ok with hydrozine  ? ? ? ? ?Assessment and Plan: ?Major depressive disorder, recurrent.  Anxiety.  ADHD. ? ?I reviewed her medication.  She is taking Intuniv, Ativan, hydroxyzine, Wellbutrin and Lamictal.  Despite taking all these medication she continues  to struggle with paranoia.  She feels her anxiety and depression is somewhat better but not comfortable around people.  Recommend discontinue Intuniv since she does not feel it is helping her ADHD symptoms.  Recommend to try risperidone 0.25 mg-5 mg at bedtime to help with sleep, paranoia.  She has cut down her Ativan and does not need a new prescription.  We will continue Lamictal 150 mg daily, Wellbutrin XL 300 mg daily and hydroxyzine 75 mg at bedtime.  Patient is not interested in therapy.  Encouraged walking every day.  Recommended to call us back if she has any  question, concern if she feels worse of symptoms.  Follow up in 4 to 6 weeks. ? ?Follow Up Instructions: ? ?  ?I discussed the assessment and treatment plan with the patient. The patient was provided an opportunity to ask questions and all were answered. The patient agreed with the plan and demonstrated an understanding of the instructions. ?  ?The patient was advised to call back or seek an in-person evaluation if the symptoms worsen or if the condition fails to improve as anticipated. ? ?Collaboration of Care: Other provider involved in patient's care AEB notes are available in epic to review. ? ?Patient/Guardian was advised Release of Information must be obtained prior to any record release in order to collaborate their care with an outside provider. Patient/Guardian was advised if they have not already done so to contact the registration department to sign all necessary forms in order for [place] to release information regarding their care.  ? ?Consent: Patient/Guardian gives verbal consent for treatment and assignment of benefits for services provided during this visit. Patient/Guardian expressed understanding and agreed to proceed.   ? ?I provided 28 minutes of non-face-to-face time during this encounter. ? ? ?Cleotis Nipper, MD  ?

## 2022-01-02 ENCOUNTER — Other Ambulatory Visit (HOSPITAL_BASED_OUTPATIENT_CLINIC_OR_DEPARTMENT_OTHER): Payer: Self-pay

## 2022-02-02 ENCOUNTER — Other Ambulatory Visit (HOSPITAL_BASED_OUTPATIENT_CLINIC_OR_DEPARTMENT_OTHER): Payer: Self-pay

## 2022-02-02 ENCOUNTER — Other Ambulatory Visit (HOSPITAL_COMMUNITY): Payer: Self-pay | Admitting: Psychiatry

## 2022-02-02 DIAGNOSIS — F331 Major depressive disorder, recurrent, moderate: Secondary | ICD-10-CM

## 2022-02-02 DIAGNOSIS — F419 Anxiety disorder, unspecified: Secondary | ICD-10-CM

## 2022-02-08 ENCOUNTER — Ambulatory Visit
Admission: RE | Admit: 2022-02-08 | Discharge: 2022-02-08 | Disposition: A | Discharge status: Home / Self Care | Payer: No Typology Code available for payment source | Source: Ambulatory Visit | Attending: Family Medicine | Admitting: Family Medicine

## 2022-02-08 ENCOUNTER — Other Ambulatory Visit: Payer: Self-pay | Admitting: Family Medicine

## 2022-02-08 DIAGNOSIS — R928 Other abnormal and inconclusive findings on diagnostic imaging of breast: Secondary | ICD-10-CM

## 2022-02-12 ENCOUNTER — Encounter (HOSPITAL_COMMUNITY): Payer: Self-pay | Admitting: Psychiatry

## 2022-02-12 ENCOUNTER — Telehealth (HOSPITAL_BASED_OUTPATIENT_CLINIC_OR_DEPARTMENT_OTHER): Payer: No Typology Code available for payment source | Admitting: Psychiatry

## 2022-02-12 ENCOUNTER — Other Ambulatory Visit (HOSPITAL_BASED_OUTPATIENT_CLINIC_OR_DEPARTMENT_OTHER): Payer: Self-pay

## 2022-02-12 VITALS — Wt 183.0 lb

## 2022-02-12 DIAGNOSIS — F331 Major depressive disorder, recurrent, moderate: Secondary | ICD-10-CM

## 2022-02-12 DIAGNOSIS — F419 Anxiety disorder, unspecified: Secondary | ICD-10-CM

## 2022-02-12 DIAGNOSIS — F902 Attention-deficit hyperactivity disorder, combined type: Secondary | ICD-10-CM | POA: Diagnosis not present

## 2022-02-12 MED ORDER — BUPROPION HCL ER (XL) 300 MG PO TB24
ORAL_TABLET | Freq: Every day | ORAL | 0 refills | Status: DC
  Filled 2022-02-12: qty 90, fill #0
  Filled 2022-04-12: qty 90, 90d supply, fill #0

## 2022-02-12 MED ORDER — RISPERIDONE 0.25 MG PO TABS
0.2500 mg | ORAL_TABLET | Freq: Every day | ORAL | 0 refills | Status: DC
  Filled 2022-02-12 – 2022-02-22 (×2): qty 90, 90d supply, fill #0

## 2022-02-12 MED ORDER — LAMOTRIGINE 150 MG PO TABS
ORAL_TABLET | Freq: Every morning | ORAL | 0 refills | Status: DC
  Filled 2022-02-12: qty 90, fill #0
  Filled 2022-04-12: qty 90, 90d supply, fill #0

## 2022-02-12 MED ORDER — HYDROXYZINE PAMOATE 25 MG PO CAPS
25.0000 mg | ORAL_CAPSULE | Freq: Every evening | ORAL | 0 refills | Status: DC | PRN
  Filled 2022-02-12 – 2022-05-08 (×3): qty 90, 45d supply, fill #0

## 2022-02-12 NOTE — Progress Notes (Signed)
Virtual Visit via Telephone Note  I connected with Shaquia Berkley on 02/12/22 at  4:00 PM EDT by telephone and verified that I am speaking with the correct person using two identifiers.  Location: Patient: Work Provider: Economist   I discussed the limitations, risks, security and privacy concerns of performing an evaluation and management service by telephone and the availability of in person appointments. I also discussed with the patient that there may be a patient responsible charge related to this service. The patient expressed understanding and agreed to proceed.   History of Present Illness: Patient is evaluated by phone session.  She is doing better on Risperdal.  She is sleeping better and her anxiety depression is improved from the past.  She denies any paranoia or any hallucination.  She is adjusting much better in her job.  She still struggles sometimes with attention, multitasking but job is manageable.  She had stopped taking Intuniv which was recommended on the last visit and she is taking Risperdal 0.25 mg.  She tried 0.5 mg but it make her tired.  She also cut down her hydroxyzine and only taking 50 mg.  She had not taken the Ativan recently but like to have in case she needed for severe anxiety.  She lives with her husband and 66 year old daughter.  Patient admitted Father's Day was very difficult because this was the first year since she lost her father.  However she had a good support from family members.  Her appetite is okay.  Her energy level is improved.  Her weight is unchanged from the past.  She has no rash, itching tremors or shakes.    Past Psychiatric History: H/O depression anxiety since age 41.  No h/o inpatient treatment, suicidal attempt, psychosis, abuse.  PCP tried Lexapro that stop working after 1 year, Abilify make mood worst. Buspar did not work, Seroquel and Zoloft did not work.  Klonopin, Xanax and gabapentin did not work.  Psychiatric Specialty  Exam: Physical Exam  Review of Systems  Weight 183 lb (83 kg).There is no height or weight on file to calculate BMI.  General Appearance: NA  Eye Contact:  NA  Speech:  Clear and Coherent and Normal Rate  Volume:  Normal  Mood:  Euthymic  Affect:  NA  Thought Process:  Goal Directed  Orientation:  Full (Time, Place, and Person)  Thought Content:  Logical  Suicidal Thoughts:  No  Homicidal Thoughts:  No  Memory:  Immediate;   Good Recent;   Good Remote;   Good  Judgement:  Intact  Insight:  Present  Psychomotor Activity:  NA  Concentration:  Concentration: Good and Attention Span: Good  Recall:  Good  Fund of Knowledge:  Good  Language:  Good  Akathisia:  No  Handed:  Right  AIMS (if indicated):     Assets:  Communication Skills Desire for Improvement Housing Resilience Social Support Talents/Skills Transportation  ADL's:  Intact  Cognition:  WNL  Sleep:   with hydroxyzine       Assessment and Plan: Major depressive disorder, recurrent.  Anxiety.  ADHD,  Patient doing better with low-dose Risperdal.  She is not taking Intuniv and does not need the new prescription of Ativan which she used to take when she needed.  Continue Lamictal 150 mg daily, Wellbutrin 300 mg daily and hydroxyzine reduced to 50 mg at bedtime and continue Risperdal 0.25 mg at bedtime.  Her long-term goal is to cut down her medication eventually.  Encourage  walking, exercise and take breathing exercise when she feels nervous or anxious.  She is not interested in therapy.  Recommended to call us back if there is any question or any concern.  Follow-up in 3 months.  Follow Up Instructions:    I discussed the assessment and treatment plan with the patient. The patient was provided an opportunity to ask questions and all were answered. The patient agreed with the plan and demonstrated an understanding of the instructions.   The patient was advised to call back or seek an in-person evaluation if the  symptoms worsen or if the condition fails to improve as anticipated.  Collaboration of Care: Primary Care Provider AEB notes are available in epic to review.  Patient/Guardian was advised Release of Information must be obtained prior to any record release in order to collaborate their care with an outside provider. Patient/Guardian was advised if they have not already done so to contact the registration department to sign all necessary forms in order for [place] to release information regarding their care.   Consent: Patient/Guardian gives verbal consent for treatment and assignment of benefits for services provided during this visit. Patient/Guardian expressed understanding and agreed to proceed.    I provided 22 minutes of non-face-to-face time during this encounter.   Cleotis Nipper, MD

## 2022-02-13 ENCOUNTER — Other Ambulatory Visit (HOSPITAL_BASED_OUTPATIENT_CLINIC_OR_DEPARTMENT_OTHER): Payer: Self-pay

## 2022-02-14 ENCOUNTER — Other Ambulatory Visit (HOSPITAL_BASED_OUTPATIENT_CLINIC_OR_DEPARTMENT_OTHER): Payer: Self-pay

## 2022-02-22 ENCOUNTER — Telehealth: Payer: Self-pay | Admitting: Family Medicine

## 2022-02-22 ENCOUNTER — Other Ambulatory Visit (HOSPITAL_BASED_OUTPATIENT_CLINIC_OR_DEPARTMENT_OTHER): Payer: Self-pay

## 2022-02-22 NOTE — Telephone Encounter (Signed)
Initiated PA for Saxenda KEY:  [id number]

## 2022-02-26 NOTE — Telephone Encounter (Signed)
Received approval from 02/26/2022 to 02/26/2023 Patient informed

## 2022-03-05 ENCOUNTER — Encounter: Payer: Self-pay | Admitting: Family Medicine

## 2022-03-05 DIAGNOSIS — E785 Hyperlipidemia, unspecified: Secondary | ICD-10-CM

## 2022-03-15 ENCOUNTER — Encounter: Payer: Self-pay | Admitting: Family Medicine

## 2022-03-15 ENCOUNTER — Ambulatory Visit (INDEPENDENT_AMBULATORY_CARE_PROVIDER_SITE_OTHER): Payer: No Typology Code available for payment source | Admitting: Family Medicine

## 2022-03-15 ENCOUNTER — Other Ambulatory Visit (HOSPITAL_BASED_OUTPATIENT_CLINIC_OR_DEPARTMENT_OTHER): Payer: Self-pay

## 2022-03-15 VITALS — BP 130/78 | HR 86 | Temp 98.0°F | Resp 16 | Ht 61.0 in | Wt 184.0 lb

## 2022-03-15 DIAGNOSIS — E669 Obesity, unspecified: Secondary | ICD-10-CM | POA: Diagnosis not present

## 2022-03-15 DIAGNOSIS — E119 Type 2 diabetes mellitus without complications: Secondary | ICD-10-CM

## 2022-03-15 MED ORDER — TIRZEPATIDE 2.5 MG/0.5ML ~~LOC~~ SOAJ
2.5000 mg | SUBCUTANEOUS | 1 refills | Status: DC
  Filled 2022-03-15 – 2022-03-16 (×2): qty 2, 28d supply, fill #0
  Filled 2022-04-03 – 2022-04-09 (×3): qty 2, 28d supply, fill #1

## 2022-03-15 NOTE — Progress Notes (Signed)
Subjective:   Chief Complaint  Patient presents with   Weight Loss    Here to discuss weight loss    Martha Shannon is a 41 y.o. female here for follow-up of diabetes.   Martha Shannon does not routinely monitor her sugars.  Patient does not require insulin.   Medications include: diet controlled Diet is healthy.  Exercise: walking  Obesity She is struggling to lose weight.  She has lost a considerable amount since having bariatric surgery but is plateaued despite the above healthy diet and routine walking.  She was on Saxenda in the past but did not notice any benefit.  She does have a history of diabetes as noted above.  She has never tried a weekly injection.  Past Medical History:  Diagnosis Date   Anxiety and depression 09/24/2016   Asthma     controlled    Chicken pox    As a child.   Depression    Diabetes mellitus type 2 in obese (HCC) 09/24/2016   Fatty liver    Kidney stones    Migraines    PONV (postoperative nausea and vomiting)    vomiting with epidural during childbirth      Related testing: Retinal exam: Due Pneumovax: done  Objective:  BP 130/78 (BP Location: Right Arm, Patient Position: Sitting, Cuff Size: Normal)   Pulse 86   Temp 98 F (36.7 C) (Oral)   Resp 16   Ht 5\' 1"  (1.549 m)   Wt 184 lb (83.5 kg)   SpO2 99%   BMI 34.77 kg/m  General:  Well developed, well nourished, in no apparent distress Lungs:  CTAB, no access msc use Cardio:  RRR, no bruits, no LE edema Psych: Age appropriate judgment and insight  Assessment:   Type 2 diabetes mellitus in remission (HCC) - Plan: Comprehensive metabolic panel, Lipid panel, Hemoglobin A1c, Microalbumin / creatinine urine ratio, tirzepatide (MOUNJARO) 2.5 MG/0.5ML Pen  Obesity, unspecified classification, unspecified obesity type, unspecified whether serious comorbidity present   Plan:   Chronic, controlled.  Add Mounjaro 2.5 mg weekly.  She will let me know if there are cost or supply issues and we  will send in Ozempic instead.   Mounjaro as above.  Counseled on diet and exercise.  Recommended she add some weight resistance exercise to her regimen. F/u in 6 mo. The patient voiced understanding and agreement to the plan.  [place], DO 03/15/22 4:08 PM

## 2022-03-15 NOTE — Addendum Note (Signed)
Addended by: Mertha Finders on: 03/15/2022 04:47 PM   Modules accepted: Orders

## 2022-03-15 NOTE — Patient Instructions (Addendum)
Give [place] 2-3 business days to get the results of your labs back.   Keep the diet clean and stay active.  Let me know if there are cost issues.   Schedule your eye exam.   Let us know if you need anything.

## 2022-03-16 ENCOUNTER — Other Ambulatory Visit (INDEPENDENT_AMBULATORY_CARE_PROVIDER_SITE_OTHER): Payer: No Typology Code available for payment source

## 2022-03-16 ENCOUNTER — Telehealth: Payer: Self-pay

## 2022-03-16 ENCOUNTER — Other Ambulatory Visit (HOSPITAL_BASED_OUTPATIENT_CLINIC_OR_DEPARTMENT_OTHER): Payer: Self-pay

## 2022-03-16 ENCOUNTER — Telehealth: Payer: Self-pay | Admitting: Family Medicine

## 2022-03-16 DIAGNOSIS — E119 Type 2 diabetes mellitus without complications: Secondary | ICD-10-CM

## 2022-03-16 LAB — LIPID PANEL
Cholesterol: 183 mg/dL (ref 0–200)
HDL: 53.8 mg/dL (ref >39.00)
LDL Cholesterol: 118 mg/dL — ABNORMAL HIGH (ref 0–99)
NonHDL: 129.57
Total CHOL/HDL Ratio: 3
Triglycerides: 59 mg/dL (ref 0.0–149.0)
VLDL: 11.8 mg/dL (ref 0.0–40.0)

## 2022-03-16 LAB — HEMOGLOBIN A1C: Hgb A1c MFr Bld: 5.9 % (ref 4.6–6.5)

## 2022-03-16 LAB — COMPREHENSIVE METABOLIC PANEL
ALT: 12 U/L (ref 0–35)
AST: 13 U/L (ref 0–37)
Albumin: 4.6 g/dL (ref 3.5–5.2)
Alkaline Phosphatase: 53 U/L (ref 39–117)
BUN: 8 mg/dL (ref 6–23)
CO2: 27 mEq/L (ref 19–32)
Calcium: 9.3 mg/dL (ref 8.4–10.5)
Chloride: 102 mEq/L (ref 96–112)
Creatinine, Ser: 0.61 mg/dL (ref 0.40–1.20)
GFR: 110.64 mL/min (ref >60.00)
Glucose, Bld: 88 mg/dL (ref 70–99)
Potassium: 4.2 mEq/L (ref 3.5–5.1)
Sodium: 138 mEq/L (ref 135–145)
Total Bilirubin: 0.3 mg/dL (ref 0.2–1.2)
Total Protein: 7 g/dL (ref 6.0–8.3)

## 2022-03-16 LAB — MICROALBUMIN / CREATININE URINE RATIO
Creatinine,U: 125 mg/dL
Microalb Creat Ratio: 7.8 mg/g (ref 0.0–30.0)
Microalb, Ur: 9.8 mg/dL — ABNORMAL HIGH (ref 0.0–1.9)

## 2022-03-16 NOTE — Telephone Encounter (Signed)
PA approved. Patient aware.

## 2022-03-16 NOTE — Telephone Encounter (Signed)
When in the lab yesterday- the pt asked if her being on her period would make a difference in her micro albumin. It was collected. Please advise.

## 2022-03-16 NOTE — Telephone Encounter (Signed)
Shouldn't play a role in those results. Ty.

## 2022-03-16 NOTE — Telephone Encounter (Signed)
PATIENT INFORMED

## 2022-03-16 NOTE — Telephone Encounter (Signed)
Approved from 03/16/2022 to 03/16/2023

## 2022-03-16 NOTE — Telephone Encounter (Signed)
Initiated PA for [facility] KEY:   [id number] WAITING RESPONSE.

## 2022-03-19 ENCOUNTER — Other Ambulatory Visit: Payer: Self-pay | Admitting: Family Medicine

## 2022-03-19 ENCOUNTER — Other Ambulatory Visit (HOSPITAL_BASED_OUTPATIENT_CLINIC_OR_DEPARTMENT_OTHER): Payer: Self-pay

## 2022-03-19 MED ORDER — ROSUVASTATIN CALCIUM 20 MG PO TABS
20.0000 mg | ORAL_TABLET | Freq: Every day | ORAL | 3 refills | Status: DC
  Filled 2022-03-19: qty 90, 90d supply, fill #0
  Filled 2022-06-01: qty 90, 90d supply, fill #1
  Filled 2022-06-20: qty 30, 30d supply, fill #1

## 2022-03-21 ENCOUNTER — Other Ambulatory Visit (HOSPITAL_COMMUNITY): Payer: Self-pay | Admitting: Psychiatry

## 2022-03-21 DIAGNOSIS — F419 Anxiety disorder, unspecified: Secondary | ICD-10-CM

## 2022-03-23 ENCOUNTER — Other Ambulatory Visit (HOSPITAL_BASED_OUTPATIENT_CLINIC_OR_DEPARTMENT_OTHER): Payer: Self-pay

## 2022-03-26 ENCOUNTER — Other Ambulatory Visit (HOSPITAL_BASED_OUTPATIENT_CLINIC_OR_DEPARTMENT_OTHER): Payer: Self-pay

## 2022-04-04 ENCOUNTER — Other Ambulatory Visit (HOSPITAL_BASED_OUTPATIENT_CLINIC_OR_DEPARTMENT_OTHER): Payer: Self-pay

## 2022-04-05 ENCOUNTER — Other Ambulatory Visit (HOSPITAL_BASED_OUTPATIENT_CLINIC_OR_DEPARTMENT_OTHER): Payer: Self-pay

## 2022-04-09 ENCOUNTER — Other Ambulatory Visit (HOSPITAL_BASED_OUTPATIENT_CLINIC_OR_DEPARTMENT_OTHER): Payer: Self-pay

## 2022-04-12 ENCOUNTER — Other Ambulatory Visit (HOSPITAL_BASED_OUTPATIENT_CLINIC_OR_DEPARTMENT_OTHER): Payer: Self-pay

## 2022-04-23 ENCOUNTER — Other Ambulatory Visit (HOSPITAL_BASED_OUTPATIENT_CLINIC_OR_DEPARTMENT_OTHER): Payer: Self-pay

## 2022-05-08 ENCOUNTER — Other Ambulatory Visit: Payer: Self-pay | Admitting: Family Medicine

## 2022-05-08 DIAGNOSIS — E119 Type 2 diabetes mellitus without complications: Secondary | ICD-10-CM

## 2022-05-09 ENCOUNTER — Telehealth (HOSPITAL_BASED_OUTPATIENT_CLINIC_OR_DEPARTMENT_OTHER): Payer: No Typology Code available for payment source | Admitting: Psychiatry

## 2022-05-09 ENCOUNTER — Encounter (HOSPITAL_COMMUNITY): Payer: Self-pay | Admitting: Psychiatry

## 2022-05-09 ENCOUNTER — Other Ambulatory Visit (HOSPITAL_BASED_OUTPATIENT_CLINIC_OR_DEPARTMENT_OTHER): Payer: Self-pay

## 2022-05-09 ENCOUNTER — Other Ambulatory Visit: Payer: Self-pay | Admitting: Family Medicine

## 2022-05-09 VITALS — Wt 170.0 lb

## 2022-05-09 DIAGNOSIS — F419 Anxiety disorder, unspecified: Secondary | ICD-10-CM | POA: Diagnosis not present

## 2022-05-09 DIAGNOSIS — F331 Major depressive disorder, recurrent, moderate: Secondary | ICD-10-CM

## 2022-05-09 DIAGNOSIS — F902 Attention-deficit hyperactivity disorder, combined type: Secondary | ICD-10-CM | POA: Diagnosis not present

## 2022-05-09 MED ORDER — ATOMOXETINE HCL 25 MG PO CAPS
25.0000 mg | ORAL_CAPSULE | Freq: Two times a day (BID) | ORAL | 1 refills | Status: DC
  Filled 2022-05-09: qty 30, 15d supply, fill #0

## 2022-05-09 MED ORDER — RISPERIDONE 0.25 MG PO TABS
0.2500 mg | ORAL_TABLET | Freq: Every day | ORAL | 0 refills | Status: DC
  Filled 2022-05-09 – 2022-06-06 (×2): qty 90, 90d supply, fill #0

## 2022-05-09 MED ORDER — HYDROXYZINE HCL 10 MG PO TABS
10.0000 mg | ORAL_TABLET | Freq: Two times a day (BID) | ORAL | 0 refills | Status: DC | PRN
  Filled 2022-05-09: qty 60, 30d supply, fill #0

## 2022-05-09 MED ORDER — TIRZEPATIDE 7.5 MG/0.5ML ~~LOC~~ SOAJ
7.5000 mg | SUBCUTANEOUS | 0 refills | Status: DC
  Filled 2022-05-09: qty 6, 84d supply, fill #0
  Filled 2022-06-06: qty 2, 28d supply, fill #0
  Filled 2022-06-28: qty 2, 28d supply, fill #1
  Filled 2022-07-24: qty 2, 28d supply, fill #2

## 2022-05-09 MED ORDER — BUPROPION HCL ER (XL) 300 MG PO TB24
ORAL_TABLET | Freq: Every day | ORAL | 0 refills | Status: DC
  Filled 2022-05-09: qty 90, 90d supply, fill #0

## 2022-05-09 MED ORDER — TIRZEPATIDE 5 MG/0.5ML ~~LOC~~ SOAJ
5.0000 mg | SUBCUTANEOUS | 0 refills | Status: DC
  Filled 2022-05-09: qty 2, 28d supply, fill #0

## 2022-05-09 MED ORDER — LAMOTRIGINE 150 MG PO TABS
ORAL_TABLET | Freq: Every morning | ORAL | 0 refills | Status: DC
  Filled 2022-05-09: qty 90, 90d supply, fill #0

## 2022-05-09 NOTE — Progress Notes (Signed)
Virtual Visit via Telephone Note  I connected with Martha Shannon on 05/09/22 at  4:20 PM EDT by telephone and verified that I am speaking with the correct person using two identifiers.  Location: Patient: Work Provider: Economist   I discussed the limitations, risks, security and privacy concerns of performing an evaluation and management service by telephone and the availability of in person appointments. I also discussed with the patient that there may be a patient responsible charge related to this service. The patient expressed understanding and agreed to proceed.   History of Present Illness: Patient is evaluated by phone session.  She still have paranoia, sometime auditory hallucinations that people are talking about her.  She does not feel comfortable going outside.  She also struggled with her focus attention and multitasking because sometimes she jumps from one topic to another topic.  She used to take Intuniv but it was stopped because she was feeling very tired.  He also tried Risperdal higher dose but again it was making him tired.  She is also not taking Vistaril 25 mg and using left about 10 mg because 25 mg again make her tired.  She denies any suicidal thoughts or homicidal thoughts.  Her energy level is low.  When sometimes she talk to her daughter she has difficulty concentration.  She lives with her husband and 71 year old daughter.  She is working.  She lost weight because she is on weight loss medication but also trying to lose weight by watching her calorie intake.  She has no tremors, shakes or any EPS.  She denies drinking or using any illegal substances.   Past Psychiatric History: H/O depression anxiety since age 38.  No h/o inpatient treatment, suicidal attempt, psychosis, abuse.  PCP tried Lexapro that stop working after 1 year, Abilify make mood worst. Buspar did not work, Seroquel and Zoloft did not work.  Klonopin, Xanax and gabapentin did not work.    Psychiatric Specialty Exam: Physical Exam  Review of Systems  Weight 170 lb (77.1 kg).There is no height or weight on file to calculate BMI.  General Appearance: NA  Eye Contact:  NA  Speech:  Clear and Coherent and Normal Rate  Volume:  Normal  Mood:  Anxious  Affect:  NA  Thought Process:  Goal Directed  Orientation:  Full (Time, Place, and Person)  Thought Content:  Hallucinations: Auditory People talking about me, Paranoid Ideation, and Rumination  Suicidal Thoughts:  No  Homicidal Thoughts:  No  Memory:  Immediate;   Good Recent;   Good Remote;   Good  Judgement:  Intact  Insight:  Present  Psychomotor Activity:  NA  Concentration:  Concentration: Good and Attention Span: Good  Recall:  Good  Fund of Knowledge:  Good  Language:  Good  Akathisia:  No  Handed:  Right  AIMS (if indicated):     Assets:  Communication Skills Desire for Improvement Housing Social Support Talents/Skills Transportation  ADL's:  Intact  Cognition:  WNL  Sleep:   fair      Assessment and Plan: Major depressive disorder, recurrent.  Anxiety.  ADHD.  Review current medication.  Patient is taking the leftover hydroxyzine 10 mg tablet during the day because 25 mg capsule was making her tired.  She still have insomnia.  We have tried higher dose of Risperdal but made her tired.  I recommend to take hydroxyzine 10 mg 2 times a day as it may help her sleep if she takes at night.  We will also start Strattera to help her ADHD symptoms.  Continue Wellbutrin XL 300 mg daily, Lamictal 150 mg daily, continue Risperdal 0.25 mg at bedtime and we will start hydroxyzine 10 mg twice a day.  Patient is not interested in therapy.  Recommended to call us back if she has any question or any concern.  Follow-up in 6 weeks.  Follow Up Instructions:    I discussed the assessment and treatment plan with the patient. The patient was provided an opportunity to ask questions and all were answered. The patient  agreed with the plan and demonstrated an understanding of the instructions.   The patient was advised to call back or seek an in-person evaluation if the symptoms worsen or if the condition fails to improve as anticipated.  Collaboration of Care: Primary Care Provider AEB notes are available in epic to review.  Patient/Guardian was advised Release of Information must be obtained prior to any record release in order to collaborate their care with an outside provider. Patient/Guardian was advised if they have not already done so to contact the registration department to sign all necessary forms in order for [place] to release information regarding their care.   Consent: Patient/Guardian gives verbal consent for treatment and assignment of benefits for services provided during this visit. Patient/Guardian expressed understanding and agreed to proceed.    I provided 27 minutes of non-face-to-face time during this encounter.   Cleotis Nipper, MD

## 2022-05-10 ENCOUNTER — Other Ambulatory Visit (HOSPITAL_BASED_OUTPATIENT_CLINIC_OR_DEPARTMENT_OTHER): Payer: Self-pay

## 2022-06-01 ENCOUNTER — Other Ambulatory Visit (HOSPITAL_BASED_OUTPATIENT_CLINIC_OR_DEPARTMENT_OTHER): Payer: Self-pay

## 2022-06-01 ENCOUNTER — Other Ambulatory Visit (HOSPITAL_COMMUNITY): Payer: Self-pay | Admitting: Psychiatry

## 2022-06-01 DIAGNOSIS — F419 Anxiety disorder, unspecified: Secondary | ICD-10-CM

## 2022-06-01 MED ORDER — FLUARIX QUADRIVALENT 0.5 ML IM SUSY
PREFILLED_SYRINGE | INTRAMUSCULAR | 0 refills | Status: DC
  Filled 2022-06-01: qty 0.5, 1d supply, fill #0

## 2022-06-06 ENCOUNTER — Other Ambulatory Visit (HOSPITAL_BASED_OUTPATIENT_CLINIC_OR_DEPARTMENT_OTHER): Payer: Self-pay

## 2022-06-07 ENCOUNTER — Other Ambulatory Visit (HOSPITAL_BASED_OUTPATIENT_CLINIC_OR_DEPARTMENT_OTHER): Payer: Self-pay

## 2022-06-14 ENCOUNTER — Other Ambulatory Visit: Payer: Self-pay

## 2022-06-14 ENCOUNTER — Encounter (HOSPITAL_BASED_OUTPATIENT_CLINIC_OR_DEPARTMENT_OTHER): Payer: Self-pay | Admitting: Emergency Medicine

## 2022-06-14 DIAGNOSIS — Z5321 Procedure and treatment not carried out due to patient leaving prior to being seen by health care provider: Secondary | ICD-10-CM | POA: Insufficient documentation

## 2022-06-14 DIAGNOSIS — R5383 Other fatigue: Secondary | ICD-10-CM | POA: Diagnosis not present

## 2022-06-14 DIAGNOSIS — R42 Dizziness and giddiness: Secondary | ICD-10-CM | POA: Insufficient documentation

## 2022-06-14 DIAGNOSIS — R519 Headache, unspecified: Secondary | ICD-10-CM | POA: Diagnosis not present

## 2022-06-14 LAB — URINALYSIS, ROUTINE W REFLEX MICROSCOPIC
Bilirubin Urine: NEGATIVE
Glucose, UA: NEGATIVE mg/dL
Hgb urine dipstick: NEGATIVE
Ketones, ur: NEGATIVE mg/dL
Leukocytes,Ua: NEGATIVE
Nitrite: NEGATIVE
Protein, ur: NEGATIVE mg/dL
Specific Gravity, Urine: 1.01 (ref 1.005–1.030)
pH: 7 (ref 5.0–8.0)

## 2022-06-14 LAB — BASIC METABOLIC PANEL
Anion gap: 7 (ref 5–15)
BUN: 9 mg/dL (ref 6–20)
CO2: 26 mmol/L (ref 22–32)
Calcium: 9 mg/dL (ref 8.9–10.3)
Chloride: 103 mmol/L (ref 98–111)
Creatinine, Ser: 0.73 mg/dL (ref 0.44–1.00)
GFR, Estimated: >60 mL/min (ref >60)
Glucose, Bld: 104 mg/dL — ABNORMAL HIGH (ref 70–99)
Potassium: 3.2 mmol/L — ABNORMAL LOW (ref 3.5–5.1)
Sodium: 136 mmol/L (ref 135–145)

## 2022-06-14 LAB — CBG MONITORING, ED: Glucose-Capillary: 98 mg/dL (ref 70–99)

## 2022-06-14 LAB — CBC
HCT: 32.7 % — ABNORMAL LOW (ref 36.0–46.0)
Hemoglobin: 10 g/dL — ABNORMAL LOW (ref 12.0–15.0)
MCH: 19.9 pg — ABNORMAL LOW (ref 26.0–34.0)
MCHC: 30.6 g/dL (ref 30.0–36.0)
MCV: 65.1 fL — ABNORMAL LOW (ref 80.0–100.0)
Platelets: 420 10*3/uL — ABNORMAL HIGH (ref 150–400)
RBC: 5.02 MIL/uL (ref 3.87–5.11)
RDW: 24.1 % — ABNORMAL HIGH (ref 11.5–15.5)
WBC: 9.6 10*3/uL (ref 4.0–10.5)
nRBC: 0 % (ref 0.0–0.2)

## 2022-06-14 LAB — PREGNANCY, URINE: Preg Test, Ur: NEGATIVE

## 2022-06-14 NOTE — ED Triage Notes (Addendum)
Dizziness, head pressure, and fatigue that started today. States when she stands sx worsen. States she drank juice pta because she thought her sugar was low. CBG 98 in triage.

## 2022-06-14 NOTE — ED Notes (Signed)
Checked CBG 98, RN Lacole informed 

## 2022-06-15 ENCOUNTER — Emergency Department (HOSPITAL_BASED_OUTPATIENT_CLINIC_OR_DEPARTMENT_OTHER)
Admission: EM | Admit: 2022-06-15 | Discharge: 2022-06-15 | Discharge status: Against Medical Advice | Payer: No Typology Code available for payment source | Attending: Emergency Medicine | Admitting: Emergency Medicine

## 2022-06-15 NOTE — ED Notes (Signed)
Called pt to take to treatment room  No response from lobby 

## 2022-06-20 ENCOUNTER — Encounter (HOSPITAL_COMMUNITY): Payer: Self-pay | Admitting: Psychiatry

## 2022-06-20 ENCOUNTER — Other Ambulatory Visit (HOSPITAL_BASED_OUTPATIENT_CLINIC_OR_DEPARTMENT_OTHER): Payer: Self-pay

## 2022-06-20 ENCOUNTER — Telehealth (HOSPITAL_BASED_OUTPATIENT_CLINIC_OR_DEPARTMENT_OTHER): Payer: No Typology Code available for payment source | Admitting: Psychiatry

## 2022-06-20 DIAGNOSIS — F331 Major depressive disorder, recurrent, moderate: Secondary | ICD-10-CM

## 2022-06-20 DIAGNOSIS — F419 Anxiety disorder, unspecified: Secondary | ICD-10-CM

## 2022-06-20 MED ORDER — HYDROXYZINE HCL 10 MG PO TABS
ORAL_TABLET | ORAL | 2 refills | Status: DC
  Filled 2022-06-20: qty 100, 25d supply, fill #0
  Filled 2022-07-24: qty 100, 25d supply, fill #1
  Filled 2022-08-22: qty 100, 25d supply, fill #2

## 2022-06-20 MED ORDER — BUPROPION HCL ER (XL) 300 MG PO TB24
300.0000 mg | ORAL_TABLET | Freq: Every day | ORAL | 0 refills | Status: DC
  Filled 2022-06-20: qty 90, 90d supply, fill #0

## 2022-06-20 MED ORDER — LAMOTRIGINE 150 MG PO TABS
ORAL_TABLET | Freq: Every morning | ORAL | 0 refills | Status: DC
  Filled 2022-06-20: qty 90, 90d supply, fill #0

## 2022-06-20 MED ORDER — RISPERIDONE 0.5 MG PO TABS
0.5000 mg | ORAL_TABLET | Freq: Every day | ORAL | 0 refills | Status: DC
  Filled 2022-06-20: qty 90, 90d supply, fill #0

## 2022-06-20 NOTE — Progress Notes (Signed)
Virtual Visit via Telephone Note  I connected with Martha Shannon on 06/20/22 at  4:00 PM EDT by telephone and verified that I am speaking with the correct person using two identifiers.  Location: Patient: In car Provider: Home Office   I discussed the limitations, risks, security and privacy concerns of performing an evaluation and management service by telephone and the availability of in person appointments. I also discussed with the patient that there may be a patient responsible charge related to this service. The patient expressed understanding and agreed to proceed.   History of Present Illness: She is evaluated by phone session.  She reported Strattera did not work for her and she feels more paranoid, anxious.  She stopped taking the Strattera.  She reported anxiety and nervousness.  She sleeps on and off.  She was taking hydroxyzine 25 mg but does reduce because she was feeling tired.  She was taking 10 mg Strattera which helps during the day but at night she had a lot of racing thoughts, overwhelmed.  She reported multiple psychosocial stressors as daughter applying for colleges, father birthday coming and work is very stressful.  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  She had a good support from her husband.  She denies any crying spells or any feeling of hopelessness but endorsed increased anxiety and some time paranoia and occasionally hallucination.  She has no tremors or shakes.  She started weight loss medication and lost more than 15 pounds in recent weeks.  So far she is tolerating all her medication.  Past Psychiatric History: H/O depression anxiety since age 79.  No h/o inpatient treatment, suicidal attempt, psychosis, abuse.  PCP tried Lexapro that stop working after 1 year, Abilify make mood worst. Buspar, Seroquel, Zoloft, Klonopin, Xanax, gabapentin and Strattera did not work.   Psychiatric Specialty Exam: Physical Exam   Review of Systems  Weight 150 lb (68 kg), last menstrual period 05/31/2022.There is no height or weight on file to calculate BMI.  General Appearance: NA  Eye Contact:  NA  Speech:  Clear and Coherent and Normal Rate  Volume:  Normal  Mood:  Anxious  Affect:  NA  Thought Process:  Descriptions of Associations: Intact  Orientation:  Full (Time, Place, and Person)  Thought Content:  Rumination  Suicidal Thoughts:  No  Homicidal Thoughts:  No  Memory:  Immediate;   Good Recent;   Good Remote;   Good  Judgement:  Fair  Insight:  Shallow  Psychomotor Activity:  NA  Concentration:  Concentration: Fair and Attention Span: Fair  Recall:  Fair  Fund of Knowledge:  Good  Language:  Good  Akathisia:  No  Handed:  Right  AIMS (if indicated):     Assets:  Communication Skills Desire for Improvement Housing Social Support Talents/Skills Transportation  ADL's:  Intact  Cognition:  WNL  Sleep:   poor      Assessment and Plan: Major depressive disorder, recurrent.  Anxiety.  ADHD.  Discontinue Strattera since patient do not see any improvement with the medication.  She still have a lot of rumination and negative thoughts.  She like to go back on higher dose of Risperdal and like to take hydroxyzine at night.  We will increase Risperdal from 0.25 mg to 0.5 mg at bedtime, hydroxyzine 10 mg during the day and up to 30 mg at bedtime, Wellbutrin XL 300 mg daily and Lamictal 150 mg daily.  I offered  therapy but patient is not interested.  Discussed medication side effects and benefits.  Recommended to call us back if she has any question, concern or if she feels worsening of the symptoms.  Follow-up in 3 months.  Follow Up Instructions:    I discussed the assessment and treatment plan with the patient. The patient was provided an opportunity to ask questions and all were answered. The patient agreed with the plan and demonstrated an understanding of the instructions.   The patient was  advised to call back or seek an in-person evaluation if the symptoms worsen or if the condition fails to improve as anticipated.  I provided 25 minutes of non-face-to-face time during this encounter.   Kathlee Nations, MD

## 2022-06-21 ENCOUNTER — Other Ambulatory Visit (HOSPITAL_BASED_OUTPATIENT_CLINIC_OR_DEPARTMENT_OTHER): Payer: Self-pay

## 2022-06-22 ENCOUNTER — Other Ambulatory Visit (HOSPITAL_BASED_OUTPATIENT_CLINIC_OR_DEPARTMENT_OTHER): Payer: Self-pay

## 2022-07-02 ENCOUNTER — Other Ambulatory Visit (HOSPITAL_BASED_OUTPATIENT_CLINIC_OR_DEPARTMENT_OTHER): Payer: Self-pay

## 2022-07-24 ENCOUNTER — Other Ambulatory Visit: Payer: Self-pay | Admitting: Family Medicine

## 2022-07-25 ENCOUNTER — Other Ambulatory Visit (HOSPITAL_BASED_OUTPATIENT_CLINIC_OR_DEPARTMENT_OTHER): Payer: Self-pay

## 2022-07-25 MED ORDER — ROSUVASTATIN CALCIUM 20 MG PO TABS
20.0000 mg | ORAL_TABLET | Freq: Every day | ORAL | 3 refills | Status: DC
  Filled 2022-07-25: qty 30, 30d supply, fill #0
  Filled 2022-09-03: qty 30, 30d supply, fill #1
  Filled 2022-09-30: qty 30, 30d supply, fill #2
  Filled 2022-11-07: qty 30, 30d supply, fill #3

## 2022-08-21 ENCOUNTER — Telehealth (HOSPITAL_COMMUNITY): Payer: Self-pay

## 2022-08-21 NOTE — Telephone Encounter (Signed)
Medication message - Message left for patient to questions if she was still having any issues with increased anxiety due to message patient had left and requested she call our office back if any continued concerns.

## 2022-08-22 ENCOUNTER — Other Ambulatory Visit: Payer: Self-pay

## 2022-08-22 ENCOUNTER — Other Ambulatory Visit (HOSPITAL_BASED_OUTPATIENT_CLINIC_OR_DEPARTMENT_OTHER): Payer: Self-pay

## 2022-08-22 ENCOUNTER — Other Ambulatory Visit: Payer: Self-pay | Admitting: Family Medicine

## 2022-08-22 MED ORDER — MOUNJARO 7.5 MG/0.5ML ~~LOC~~ SOAJ
7.5000 mg | SUBCUTANEOUS | 0 refills | Status: DC
  Filled 2022-08-22: qty 2, 28d supply, fill #0
  Filled 2022-09-24: qty 2, 28d supply, fill #1

## 2022-09-19 ENCOUNTER — Encounter (HOSPITAL_COMMUNITY): Payer: Self-pay | Admitting: Psychiatry

## 2022-09-19 ENCOUNTER — Other Ambulatory Visit (HOSPITAL_BASED_OUTPATIENT_CLINIC_OR_DEPARTMENT_OTHER): Payer: Self-pay

## 2022-09-19 ENCOUNTER — Telehealth (HOSPITAL_BASED_OUTPATIENT_CLINIC_OR_DEPARTMENT_OTHER): Payer: 59 | Admitting: Psychiatry

## 2022-09-19 ENCOUNTER — Telehealth (HOSPITAL_COMMUNITY): Payer: No Typology Code available for payment source | Admitting: Psychiatry

## 2022-09-19 VITALS — Wt 149.0 lb

## 2022-09-19 DIAGNOSIS — F419 Anxiety disorder, unspecified: Secondary | ICD-10-CM | POA: Diagnosis not present

## 2022-09-19 DIAGNOSIS — F331 Major depressive disorder, recurrent, moderate: Secondary | ICD-10-CM | POA: Diagnosis not present

## 2022-09-19 MED ORDER — RISPERIDONE 0.5 MG PO TABS
0.5000 mg | ORAL_TABLET | Freq: Every day | ORAL | 0 refills | Status: DC
  Filled 2022-09-19: qty 90, 90d supply, fill #0

## 2022-09-19 MED ORDER — LORAZEPAM 0.5 MG PO TABS
0.5000 mg | ORAL_TABLET | ORAL | 0 refills | Status: AC | PRN
  Filled 2022-09-19: qty 20, 20d supply, fill #0

## 2022-09-19 MED ORDER — HYDROXYZINE HCL 10 MG PO TABS
ORAL_TABLET | ORAL | 2 refills | Status: DC
  Filled 2022-09-19: qty 100, 17d supply, fill #0

## 2022-09-19 MED ORDER — BUPROPION HCL ER (XL) 300 MG PO TB24
300.0000 mg | ORAL_TABLET | Freq: Every day | ORAL | 0 refills | Status: DC
  Filled 2022-09-19: qty 90, 90d supply, fill #0

## 2022-09-19 MED ORDER — LAMOTRIGINE 150 MG PO TABS
150.0000 mg | ORAL_TABLET | Freq: Every morning | ORAL | 0 refills | Status: DC
  Filled 2022-09-19: qty 90, 90d supply, fill #0

## 2022-09-19 NOTE — Progress Notes (Signed)
Virtual Visit via Video Note  I connected with Martha Shannon on 09/19/22 at  1:00 PM EST by a video enabled telemedicine application and verified that I am speaking with the correct person using two identifiers.  Location: Patient: Work Provider: Biomedical scientist   I discussed the limitations of evaluation and management by telemedicine and the availability of in person appointments. The patient expressed understanding and agreed to proceed.  History of Present Illness: Patient is evaluated by video session.  She is at work.  She reported a lot of anxiety and nervousness started around holidays.  She is not sure what triggered but admitted having crying spells and feels nervous and anxious.  Patient told last month having the death anniversary of his father who died last year, daughter applying for the colleges and anxious because she will leave the home soon and concerned about her sister who had a stroke.  She is sleeping better with the hydroxyzine but struggle with anxiety.  In the past she used to take the Ativan which stopped after she was feeling better.  She is taking Risperdal, Wellbutrin, hydroxyzine and Lamictal.  She has no rash, itching tremors or shakes.  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  She had a good support from her husband.  She denies any hallucination, paranoia, mania or any psychosis.  Her appetite is fair.  Her weight is unchanged from the past.  She used to take Strattera for her ADHD however has not taken in a while because her attention concentration is better.  We have increased the Risperdal 0.25 mg to 0.5 mg.  She does not feel it is causing any side effects.  She admitted her depression is mostly situational.  Past Psychiatric History: H/O depression anxiety since age 17.  No h/o inpatient treatment, suicidal attempt, psychosis, abuse.  PCP tried Lexapro that stop working after 1 year, Abilify make mood worst. Buspar,  Seroquel, Zoloft, Klonopin, Xanax, gabapentin and Strattera did not work.   Psychiatric Specialty Exam: Physical Exam  Review of Systems  Weight 149 lb (67.6 kg).There is no height or weight on file to calculate BMI.  General Appearance: Casual  Eye Contact:  Fair  Speech:  Slow  Volume:  Decreased  Mood:  Anxious and Depressed  Affect:  Congruent  Thought Process:  Goal Directed  Orientation:  Full (Time, Place, and Person)  Thought Content:  Rumination  Suicidal Thoughts:  No  Homicidal Thoughts:  No  Memory:  Immediate;   Good Recent;   Good Remote;   Good  Judgement:  Intact  Insight:  Present  Psychomotor Activity:  Decreased  Concentration:  Concentration: Good and Attention Span: Good  Recall:  Good  Fund of Knowledge:  Good  Language:  Good  Akathisia:  No  Handed:  Right  AIMS (if indicated):     Assets:  Communication Skills Desire for Improvement Housing Social Support Talents/Skills Transportation  ADL's:  Intact  Cognition:  WNL  Sleep:   sleeping more      Assessment and Plan: Major depressive disorder, recurrent.  Anxiety.  Patient like to try again low-dose Ativan to help with anxiety which had helped her a lot.  Mostly her anxiety is during the day and she cannot take the hydroxyzine because it is making her sleepy and groggy.  She used to take the higher dose of hydroxyzine but does cut down so she can tolerate better.  Her sleep is better.  We  discussed psychosocial stressors as concern about sister, daughter applying for college and soon she will be away from the home.  I discussed should consider seeing a therapist and she promised to look into it.  We will add low-dose Ativan 0.5 mg to take as needed for severe anxiety number 20 pills.  We discussed benzodiazepine dependence, tolerance or withdrawal and recommend not to take every day to become dependent on it.  Patient agreed with the plan.  We will continue Risperdal 0.5 mg at bedtime, hydroxyzine  10 mg at bedtime, Wellbutrin XL 300 mg daily and Lamictal 150 mg daily.  Recommended to call us back if is any question or any concern.  Recommend to call us back if she agree and ready for therapy so we can provide referrals.  Follow Up Instructions:    I discussed the assessment and treatment plan with the patient. The patient was provided an opportunity to ask questions and all were answered. The patient agreed with the plan and demonstrated an understanding of the instructions.   The patient was advised to call back or seek an in-person evaluation if the symptoms worsen or if the condition fails to improve as anticipated.  Collaboration of Care: Other provider involved in patient's care AEB notes are available in epic to review.  Patient/Guardian was advised Release of Information must be obtained prior to any record release in order to collaborate their care with an outside provider. Patient/Guardian was advised if they have not already done so to contact the registration department to sign all necessary forms in order for [place] to release information regarding their care.   Consent: Patient/Guardian gives verbal consent for treatment and assignment of benefits for services provided during this visit. Patient/Guardian expressed understanding and agreed to proceed.    I provided 24 minutes of non-face-to-face time during this encounter.   Kathlee Nations, MD

## 2022-09-21 ENCOUNTER — Other Ambulatory Visit (HOSPITAL_BASED_OUTPATIENT_CLINIC_OR_DEPARTMENT_OTHER): Payer: Self-pay

## 2022-09-24 ENCOUNTER — Other Ambulatory Visit (HOSPITAL_BASED_OUTPATIENT_CLINIC_OR_DEPARTMENT_OTHER): Payer: Self-pay

## 2022-10-04 ENCOUNTER — Telehealth (HOSPITAL_COMMUNITY): Payer: 59 | Admitting: Psychiatry

## 2022-10-05 ENCOUNTER — Other Ambulatory Visit (HOSPITAL_BASED_OUTPATIENT_CLINIC_OR_DEPARTMENT_OTHER): Payer: Self-pay

## 2022-10-11 IMAGING — MG DIGITAL DIAGNOSTIC BILAT W/ TOMO W/ CAD
8 of 13 series · 8 of 33 positions shown · non-contrast
Comparison: 03/27/2021

CLINICAL DATA: Delayed six-month follow-up for probably benign LEFT
breast calcifications.

EXAM:
DIGITAL DIAGNOSTIC BILATERAL MAMMOGRAM WITH TOMOSYNTHESIS AND CAD
TECHNIQUE: Bilateral digital diagnostic mammography and breast tomosynthesis
was performed. The images were evaluated with computer-aided
detection.

[L CC (1 of 2)]
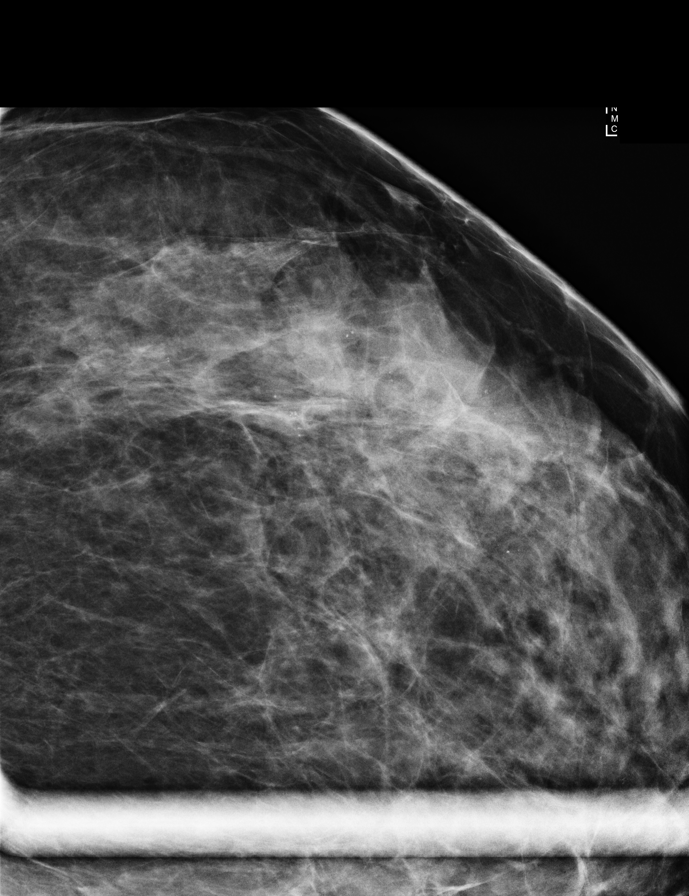

[L CC (2 of 2)]
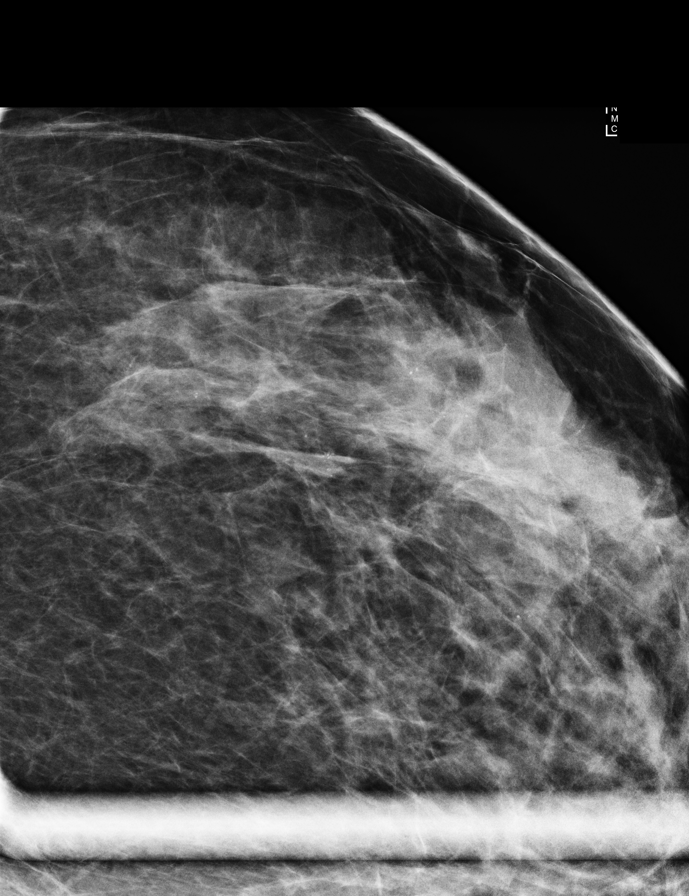

[L ML]
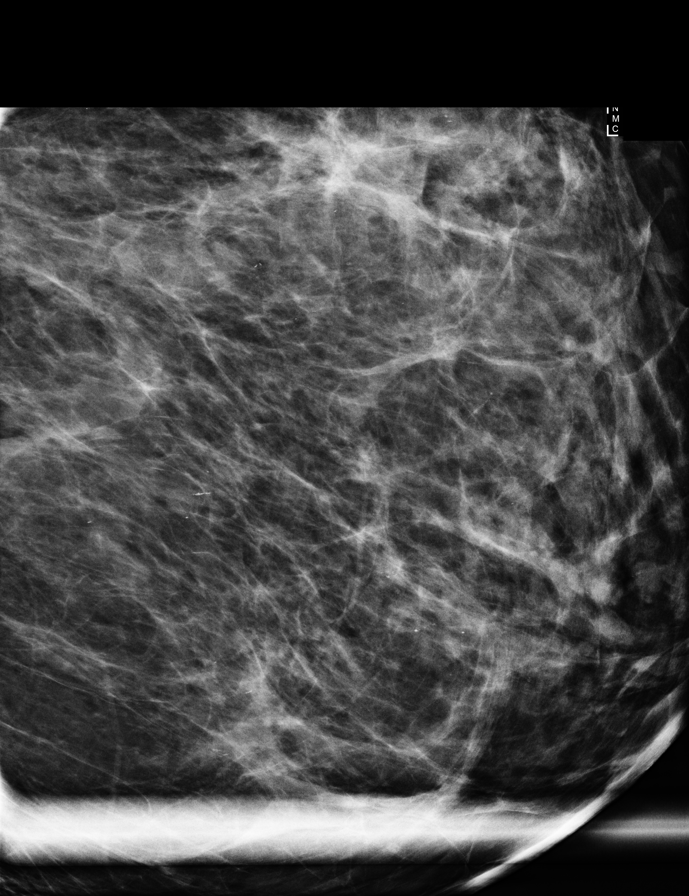

[L CC synth-2D]
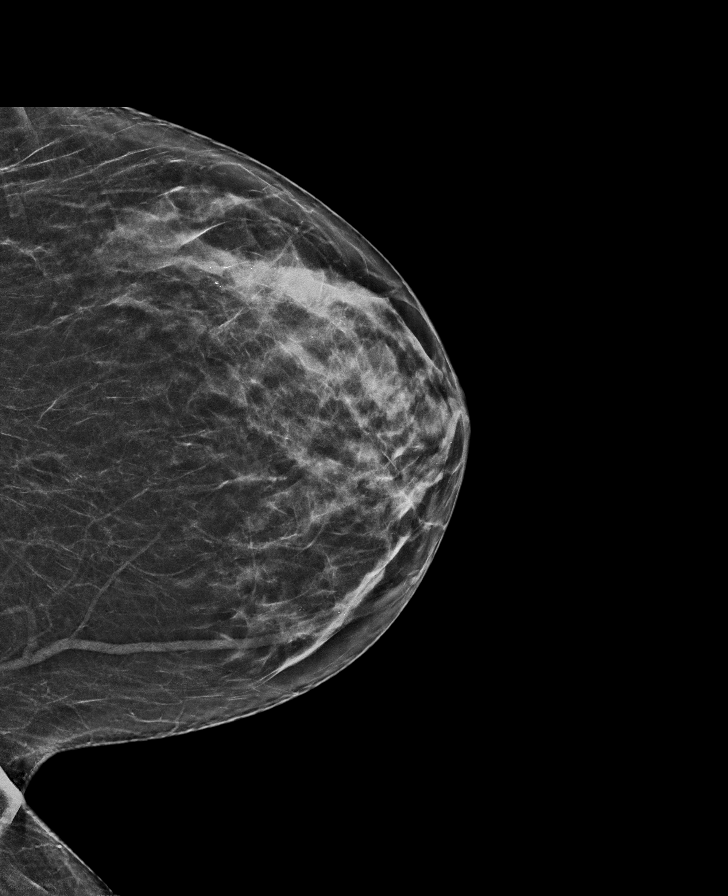

[R CC synth-2D]
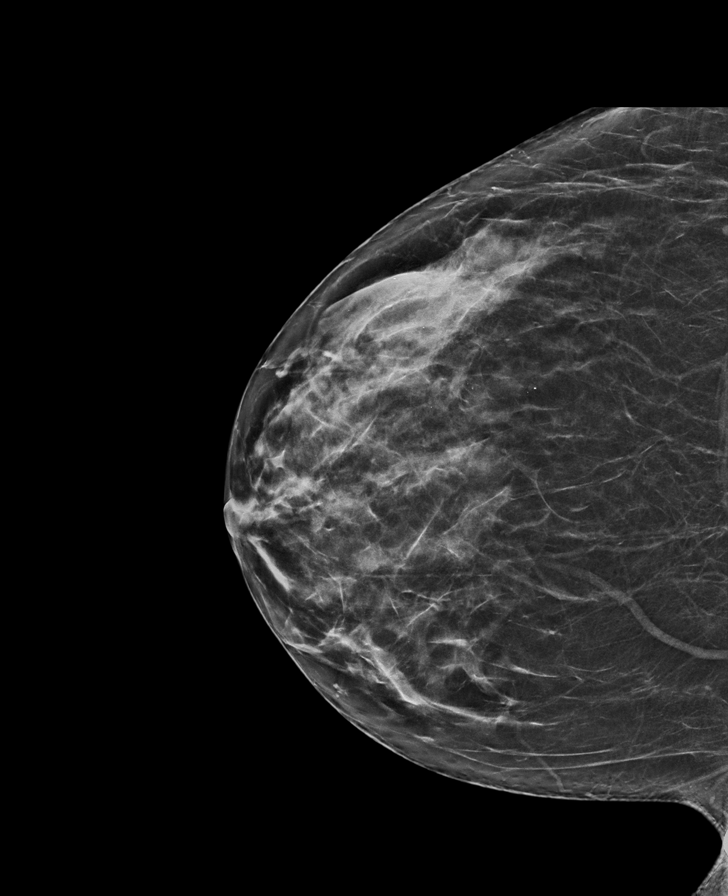

[R MLO synth-2D]
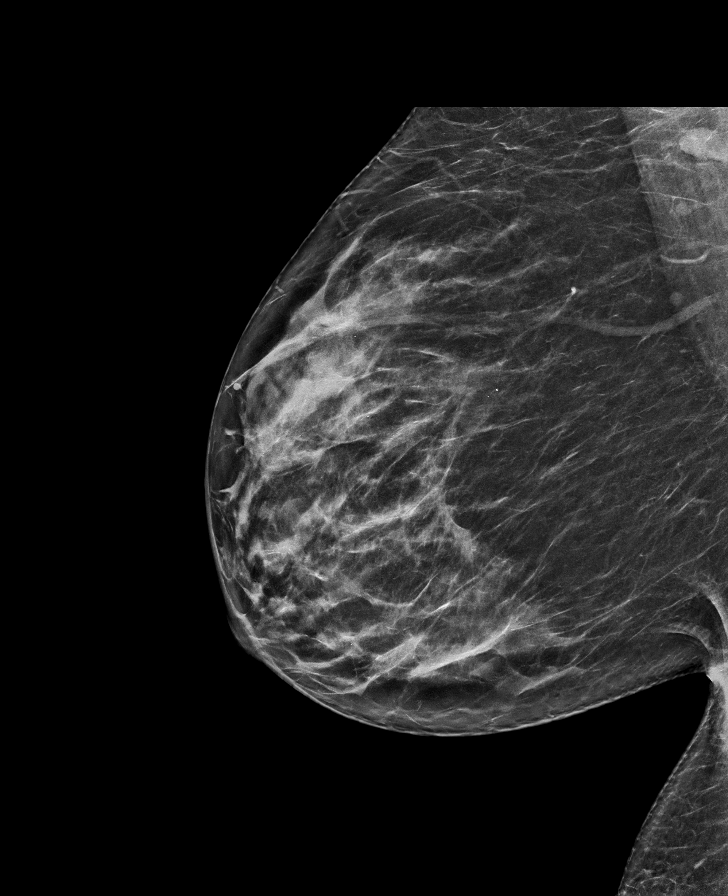

[L MLO synth-2D]
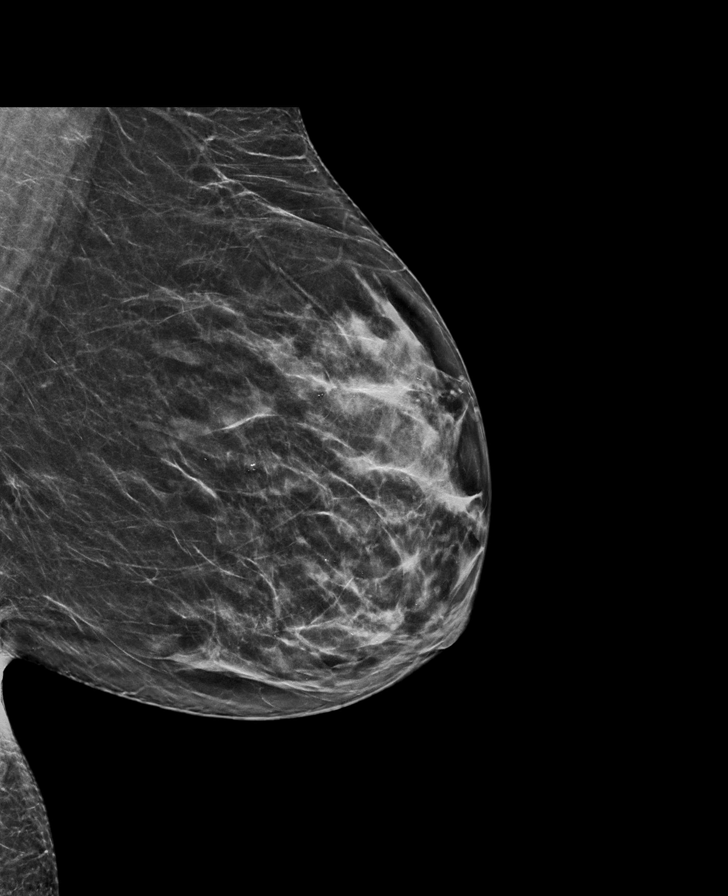

[L ML synth-2D]
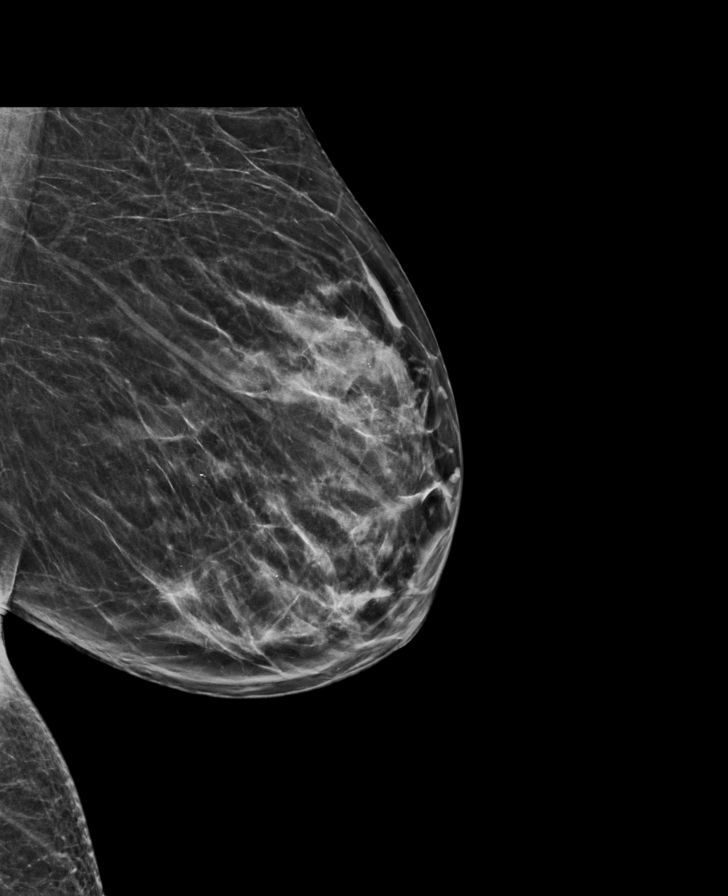

[8 of 33 positions shown; findings below may reference images not displayed]

ACR Breast Density Category c: The breast tissue is heterogeneously
dense, which may obscure small masses.
FINDINGS: Magnified views are performed of calcifications in the UPPER-OUTER
QUADRANT of the LEFT breast. These views demonstrate scattered
layering calcifications, without suspicious morphology or
distribution. No mass or distortion identified in either breast.
IMPRESSION: Stable, probably benign LEFT breast calcifications.

RECOMMENDATION:
Recommend bilateral diagnostic mammogram in 1 year.

I have discussed the findings and recommendations with the patient.
If applicable, a reminder letter will be sent to the patient
regarding the next appointment.

BI-RADS CATEGORY  3: Probably benign.

## 2022-10-15 ENCOUNTER — Encounter: Payer: Self-pay | Admitting: Family Medicine

## 2022-10-15 ENCOUNTER — Other Ambulatory Visit (HOSPITAL_BASED_OUTPATIENT_CLINIC_OR_DEPARTMENT_OTHER): Payer: Self-pay

## 2022-10-15 ENCOUNTER — Ambulatory Visit (INDEPENDENT_AMBULATORY_CARE_PROVIDER_SITE_OTHER): Payer: 59 | Admitting: Family Medicine

## 2022-10-15 VITALS — BP 110/71 | HR 92 | Temp 98.0°F | Ht 61.0 in | Wt 142.1 lb

## 2022-10-15 DIAGNOSIS — H1031 Unspecified acute conjunctivitis, right eye: Secondary | ICD-10-CM | POA: Diagnosis not present

## 2022-10-15 MED ORDER — POLYMYXIN B-TRIMETHOPRIM 10000-0.1 UNIT/ML-% OP SOLN
1.0000 [drp] | OPHTHALMIC | 0 refills | Status: DC
  Filled 2022-10-15: qty 10, 30d supply, fill #0

## 2022-10-15 MED ORDER — TIRZEPATIDE 5 MG/0.5ML ~~LOC~~ SOAJ
5.0000 mg | SUBCUTANEOUS | 1 refills | Status: DC
  Filled 2022-10-15 – 2022-10-17 (×2): qty 2, 28d supply, fill #0
  Filled 2022-11-22: qty 2, 28d supply, fill #1

## 2022-10-15 NOTE — Progress Notes (Signed)
Chief Complaint  Patient presents with   Conjunctivitis    Right eye Would like a work note for today    Martha Shannon is here for right eye irritation.  Duration: 1  d Chemical exposure? No  Recent URI? No  Contact lenses? Yes  History of allergies? No  Treatment to date: warm compress Feels pressure over eye and sensitivity to light.  Vision is blurry on R compared to L.   Past Medical History:  Diagnosis Date   Anxiety and depression 09/24/2016   Asthma     controlled    Chicken pox    As a child.   Depression    Diabetes mellitus type 2 in obese ([place]) 09/24/2016   Fatty liver    Kidney stones    Migraines    PONV (postoperative nausea and vomiting)    vomiting with epidural during childbirth    Family History  Problem Relation Age of Onset   Diabetes Mother    Diabetes Father    Hypertension Father    Heart disease Father    Diabetes Sister    Alzheimer's disease Paternal Grandmother    Alzheimer's disease Paternal Grandfather     BP 110/71 (BP Location: Left Arm, Patient Position: Sitting, Cuff Size: Normal)   Pulse 92   Temp 98 F (36.7 C) (Oral)   Ht 5' 1"$  (1.549 m)   Wt 142 lb 2 oz (64.5 kg)   SpO2 99%   BMI 26.85 kg/m  Gen: Awake, alert, appears stated age Eyes: Lid injected on R, neg on L, Sclera white on L, injected on R, PERRLA, EOMi; no ttp w palpation over R closed globe Nose: Nares patent without discharge Mouth: MMM, pharynx without erythema or exudate Psych: Age appropriate judgment and insight; mood and affect normal  Acute conjunctivitis of right eye, unspecified acute conjunctivitis type - Plan: trimethoprim-polymyxin b (POLYTRIM) ophthalmic solution  Polytrim q 4 hrs for 7-10 d.  Instructed to practice good hand hygiene and try not to touch face. Warm compresses and artificial tears also recommended. F/u prn. Pt voiced understanding and agreement to the plan.  [place], DO 10/15/22 9:55 AM

## 2022-10-15 NOTE — Patient Instructions (Signed)
Consider warm compresses.   Artificial tears like Refresh and Systane may be used for comfort. OK to get generic version. Generally people use them every 2-4 hours, but you can use them as much as you want because there is no medication in it.  Let us know if you need anything.

## 2022-10-17 ENCOUNTER — Other Ambulatory Visit (HOSPITAL_BASED_OUTPATIENT_CLINIC_OR_DEPARTMENT_OTHER): Payer: Self-pay

## 2022-11-07 ENCOUNTER — Other Ambulatory Visit (HOSPITAL_BASED_OUTPATIENT_CLINIC_OR_DEPARTMENT_OTHER): Payer: Self-pay

## 2022-11-20 ENCOUNTER — Other Ambulatory Visit (HOSPITAL_BASED_OUTPATIENT_CLINIC_OR_DEPARTMENT_OTHER): Payer: Self-pay

## 2022-11-22 ENCOUNTER — Other Ambulatory Visit (HOSPITAL_BASED_OUTPATIENT_CLINIC_OR_DEPARTMENT_OTHER): Payer: Self-pay

## 2022-11-22 ENCOUNTER — Telehealth (HOSPITAL_BASED_OUTPATIENT_CLINIC_OR_DEPARTMENT_OTHER): Payer: 59 | Admitting: Psychiatry

## 2022-11-22 ENCOUNTER — Encounter (HOSPITAL_COMMUNITY): Payer: Self-pay | Admitting: Psychiatry

## 2022-11-22 DIAGNOSIS — F331 Major depressive disorder, recurrent, moderate: Secondary | ICD-10-CM

## 2022-11-22 DIAGNOSIS — F419 Anxiety disorder, unspecified: Secondary | ICD-10-CM | POA: Diagnosis not present

## 2022-11-22 MED ORDER — HYDROXYZINE HCL 10 MG PO TABS
ORAL_TABLET | ORAL | 2 refills | Status: DC
  Filled 2022-11-22: qty 60, 12d supply, fill #0
  Filled 2022-12-24: qty 60, 12d supply, fill #1
  Filled 2023-01-26: qty 60, 12d supply, fill #2

## 2022-11-22 MED ORDER — BUPROPION HCL ER (XL) 300 MG PO TB24
300.0000 mg | ORAL_TABLET | Freq: Every day | ORAL | 0 refills | Status: DC
  Filled 2022-11-22 – 2023-04-15 (×2): qty 90, 90d supply, fill #0

## 2022-11-22 MED ORDER — LAMOTRIGINE 150 MG PO TABS
150.0000 mg | ORAL_TABLET | Freq: Every morning | ORAL | 0 refills | Status: DC
  Filled 2022-11-22 – 2023-04-15 (×2): qty 90, 90d supply, fill #0

## 2022-11-22 MED ORDER — RISPERIDONE 0.5 MG PO TABS
0.5000 mg | ORAL_TABLET | Freq: Every day | ORAL | 0 refills | Status: DC
  Filled 2022-11-22 – 2023-02-06 (×2): qty 90, 90d supply, fill #0

## 2022-11-22 NOTE — Progress Notes (Signed)
[facility] MD Virtual Progress Note   Patient Location: In Car Provider Location: Office  I connect with patient by video and verified that I am speaking with correct person by using two identifiers. I discussed the limitations of evaluation and management by telemedicine and the availability of in person appointments. I also discussed with the patient that there may be a patient responsible charge related to this service. The patient expressed understanding and agreed to proceed.  Martha Shannon [id number] 42 y.o.  11/22/2022 3:55 PM  History of Present Illness:  Patient is evaluated by video session.  She is in the car.  She is doing better since started taking again hydroxyzine.  She is sleeping better.  She takes 1 to 2 tablet at night and that helps her anxiety and sleep.  She started walking every day with her daughter.  Her daughter is going to [facility] and she is happy it is close to her house.  She is compliant with the Risperdal, Wellbutrin and Lamictal.  She has no rash or any itching.  She had lost weight and she feels happy about it.  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  Her job is going okay and there are days when some challenges but manageable.  She has no tremors, rash, itching or any shakes.  Her energy level is good.  She denies drinking or using any illegal substances.  She denies any panic attack.  She denies any feeling of hopelessness.  Past Psychiatric History: H/O depression anxiety since age 27.  No h/o inpatient treatment, suicidal attempt, psychosis, abuse.  PCP tried Lexapro that stop working after 1 year, Abilify make mood worst. Buspar, Seroquel, Zoloft, Klonopin, Xanax, gabapentin and Strattera did not work.     Outpatient Encounter Medications as of 11/22/2022  Medication Sig   buPROPion (WELLBUTRIN XL) 300 MG 24 hr tablet Take 1 tablet (300 mg total) by mouth daily.    hydrOXYzine (ATARAX) 10 MG tablet Take 2-3 tablets (20-30 mg total) by mouth daily AND 2-3 tablets (20-30 mg total) at bedtime.   influenza vac split quadrivalent PF (FLUARIX QUADRIVALENT) 0.5 ML injection Inject into the muscle.   lamoTRIgine (LAMICTAL) 150 MG tablet Take 1 tablet (150 mg total) by mouth every morning.   LORazepam (ATIVAN) 0.5 MG tablet Take 1 tablet (0.5 mg total) by mouth as needed for anxiety.   risperiDONE (RISPERDAL) 0.5 MG tablet Take 1 tablet (0.5 mg total) by mouth at bedtime.   rosuvastatin (CRESTOR) 20 MG tablet Take 1 tablet (20 mg total) by mouth daily.   tirzepatide [facility]) 5 MG/0.5ML Pen Inject 5 mg into the skin once a week.   trimethoprim-polymyxin b (POLYTRIM) ophthalmic solution Place 1 drop into the right eye every 4 (four) hours.   [DISCONTINUED] phentermine (ADIPEX-P) 37.5 MG tablet Take 1 tablet (37.5 mg total) by mouth daily before breakfast.   [DISCONTINUED] QUEtiapine (SEROQUEL) 50 MG tablet Take 1 tablet (50 mg total) by mouth at bedtime. (Patient not taking: Reported on 05/09/2020)   [DISCONTINUED] sertraline (ZOLOFT) 100 MG tablet TAKE 1 TABLET BY MOUTH ONCE DAILY   [DISCONTINUED] traZODone (DESYREL) 50 MG tablet TAKE 1/2-1 TABLET (25-50 MG TOTAL) BY MOUTH AT BEDTIME AS NEEDED FOR SLEEP.   No facility-administered encounter medications on file as of 11/22/2022.    No results found for this or any previous visit (from the past 2160 hour(s)).   Psychiatric Specialty Exam: Physical  Exam  Review of Systems  Weight 130 lb (59 kg).There is no height or weight on file to calculate BMI.  General Appearance: Casual  Eye Contact:  Good  Speech:  Clear and Coherent  Volume:  Normal  Mood:  Euthymic  Affect:  Appropriate  Thought Process:  Goal Directed  Orientation:  Full (Time, Place, and Person)  Thought Content:  WDL  Suicidal Thoughts:  No  Homicidal Thoughts:  No  Memory:  Immediate;   Good Recent;   Good Remote;   Good  Judgement:  Good   Insight:  Present  Psychomotor Activity:  Normal  Concentration:  Concentration: Good and Attention Span: Good  Recall:  Good  Fund of Knowledge:  Good  Language:  Good  Akathisia:  No  Handed:  Right  AIMS (if indicated):     Assets:  Communication Skills [employer] [place] Talents/Skills Transportation  ADL's:  Intact  Cognition:  WNL  Sleep:  ok     Assessment/Plan: Anxiety - Plan: lamoTRIgine (LAMICTAL) 150 MG tablet, risperiDONE (RISPERDAL) 0.5 MG tablet, buPROPion (WELLBUTRIN XL) 300 MG 24 hr tablet, hydrOXYzine (ATARAX) 10 MG tablet  MDD (major depressive disorder), recurrent episode, moderate (HCC) - Plan: lamoTRIgine (LAMICTAL) 150 MG tablet, risperiDONE (RISPERDAL) 0.5 MG tablet, buPROPion (WELLBUTRIN XL) 300 MG 24 hr tablet  Patient is doing very well on her current medication.  She has not taking Ativan since the last visit but taking the hydroxyzine at least 2 pills every night which is helping her sleep.  Continue Risperdal 0.5 mg at bedtime, hydroxyzine 10-20 mg at bedtime, Wellbutrin XL 300 mg daily and Lamictal 150 mg daily.  Recommended to call us back if you have any question or any concern.  Follow-up in 3 months.   Follow Up Instructions:     I discussed the assessment and treatment plan with the patient. The patient was provided an opportunity to ask questions and all were answered. The patient agreed with the plan and demonstrated an understanding of the instructions.   The patient was advised to call back or seek an in-person evaluation if the symptoms worsen or if the condition fails to improve as anticipated.    Collaboration of Care: Other provider involved in patient's care AEB notes are available in epic to review.  Patient/Guardian was advised Release of Information must be obtained prior to any record release in order to collaborate their care with an outside provider. Patient/Guardian was advised if they  have not already done so to contact the registration department to sign all necessary forms in order for [place] to release information regarding their care.   Consent: Patient/Guardian gives verbal consent for treatment and assignment of benefits for services provided during this visit. Patient/Guardian expressed understanding and agreed to proceed.     I provided 18 minutes of non face to face time during this encounter.  Kathlee Nations, MD 11/22/2022

## 2022-11-23 ENCOUNTER — Other Ambulatory Visit (HOSPITAL_BASED_OUTPATIENT_CLINIC_OR_DEPARTMENT_OTHER): Payer: Self-pay

## 2022-11-26 ENCOUNTER — Other Ambulatory Visit (HOSPITAL_BASED_OUTPATIENT_CLINIC_OR_DEPARTMENT_OTHER): Payer: Self-pay

## 2022-11-29 ENCOUNTER — Encounter (HOSPITAL_COMMUNITY): Payer: Self-pay | Admitting: *Deleted

## 2022-12-24 ENCOUNTER — Other Ambulatory Visit: Payer: Self-pay | Admitting: Family Medicine

## 2022-12-24 ENCOUNTER — Other Ambulatory Visit (HOSPITAL_BASED_OUTPATIENT_CLINIC_OR_DEPARTMENT_OTHER): Payer: Self-pay

## 2022-12-24 MED ORDER — ROSUVASTATIN CALCIUM 20 MG PO TABS
20.0000 mg | ORAL_TABLET | Freq: Every day | ORAL | 3 refills | Status: DC
  Filled 2022-12-24: qty 30, 30d supply, fill #0
  Filled 2023-02-06: qty 30, 30d supply, fill #1
  Filled 2023-03-22: qty 30, 30d supply, fill #2
  Filled 2023-05-16: qty 30, 30d supply, fill #3

## 2022-12-24 MED ORDER — MOUNJARO 5 MG/0.5ML ~~LOC~~ SOAJ
5.0000 mg | SUBCUTANEOUS | 1 refills | Status: DC
  Filled 2022-12-24: qty 2, 28d supply, fill #0
  Filled 2023-01-26: qty 2, 28d supply, fill #1

## 2022-12-26 ENCOUNTER — Other Ambulatory Visit (HOSPITAL_BASED_OUTPATIENT_CLINIC_OR_DEPARTMENT_OTHER): Payer: Self-pay

## 2023-02-07 ENCOUNTER — Other Ambulatory Visit (HOSPITAL_BASED_OUTPATIENT_CLINIC_OR_DEPARTMENT_OTHER): Payer: Self-pay

## 2023-02-21 ENCOUNTER — Other Ambulatory Visit: Payer: Self-pay | Admitting: Family Medicine

## 2023-02-21 ENCOUNTER — Telehealth (HOSPITAL_COMMUNITY): Payer: 59 | Admitting: Psychiatry

## 2023-02-21 ENCOUNTER — Encounter: Payer: Self-pay | Admitting: *Deleted

## 2023-02-21 ENCOUNTER — Other Ambulatory Visit (HOSPITAL_BASED_OUTPATIENT_CLINIC_OR_DEPARTMENT_OTHER): Payer: Self-pay

## 2023-02-21 MED ORDER — MOUNJARO 5 MG/0.5ML ~~LOC~~ SOAJ
5.0000 mg | SUBCUTANEOUS | 0 refills | Status: DC
  Filled 2023-02-21: qty 2, 28d supply, fill #0

## 2023-02-22 ENCOUNTER — Telehealth (HOSPITAL_COMMUNITY): Payer: 59 | Admitting: Psychiatry

## 2023-02-26 ENCOUNTER — Other Ambulatory Visit (HOSPITAL_BASED_OUTPATIENT_CLINIC_OR_DEPARTMENT_OTHER): Payer: Self-pay

## 2023-03-01 ENCOUNTER — Encounter: Payer: Self-pay | Admitting: Family Medicine

## 2023-03-01 ENCOUNTER — Ambulatory Visit (INDEPENDENT_AMBULATORY_CARE_PROVIDER_SITE_OTHER): Payer: 59 | Admitting: Family Medicine

## 2023-03-01 ENCOUNTER — Other Ambulatory Visit (HOSPITAL_BASED_OUTPATIENT_CLINIC_OR_DEPARTMENT_OTHER): Payer: Self-pay

## 2023-03-01 VITALS — BP 120/78 | HR 48 | Temp 97.9°F | Ht 61.0 in | Wt 141.2 lb

## 2023-03-01 DIAGNOSIS — L219 Seborrheic dermatitis, unspecified: Secondary | ICD-10-CM

## 2023-03-01 DIAGNOSIS — E1169 Type 2 diabetes mellitus with other specified complication: Secondary | ICD-10-CM | POA: Diagnosis not present

## 2023-03-01 DIAGNOSIS — R921 Mammographic calcification found on diagnostic imaging of breast: Secondary | ICD-10-CM

## 2023-03-01 DIAGNOSIS — E785 Hyperlipidemia, unspecified: Secondary | ICD-10-CM

## 2023-03-01 DIAGNOSIS — Z0000 Encounter for general adult medical examination without abnormal findings: Secondary | ICD-10-CM

## 2023-03-01 DIAGNOSIS — R5383 Other fatigue: Secondary | ICD-10-CM | POA: Diagnosis not present

## 2023-03-01 DIAGNOSIS — Z7985 Long-term (current) use of injectable non-insulin antidiabetic drugs: Secondary | ICD-10-CM | POA: Diagnosis not present

## 2023-03-01 DIAGNOSIS — D509 Iron deficiency anemia, unspecified: Secondary | ICD-10-CM | POA: Diagnosis not present

## 2023-03-01 DIAGNOSIS — J029 Acute pharyngitis, unspecified: Secondary | ICD-10-CM | POA: Diagnosis not present

## 2023-03-01 MED ORDER — CLOBETASOL PROPIONATE 0.05 % EX SOLN
1.0000 | Freq: Two times a day (BID) | CUTANEOUS | 0 refills | Status: DC
  Filled 2023-03-01: qty 50, 25d supply, fill #0

## 2023-03-01 MED ORDER — MOUNJARO 5 MG/0.5ML ~~LOC~~ SOAJ
5.0000 mg | SUBCUTANEOUS | 2 refills | Status: DC
  Filled 2023-03-01 – 2023-03-22 (×2): qty 6, 84d supply, fill #0
  Filled 2023-06-17: qty 2, 28d supply, fill #1
  Filled 2023-07-29: qty 2, 28d supply, fill #2
  Filled 2023-08-26: qty 2, 28d supply, fill #3
  Filled 2023-09-24: qty 2, 28d supply, fill #4
  Filled 2023-10-24: qty 2, 28d supply, fill #5
  Filled 2023-11-13 – 2023-11-18 (×2): qty 2, 28d supply, fill #6

## 2023-03-01 MED ORDER — ALBUTEROL SULFATE HFA 108 (90 BASE) MCG/ACT IN AERS
2.0000 | INHALATION_SPRAY | RESPIRATORY_TRACT | 5 refills | Status: DC | PRN
  Filled 2023-03-01: qty 6.7, 30d supply, fill #0
  Filled 2023-11-13: qty 6.7, 30d supply, fill #1

## 2023-03-01 NOTE — Patient Instructions (Addendum)
Give [place] 2-3 business days to get the results of your labs back.   Keep the diet clean and stay active.  Please get me a copy of your advanced directive form at your convenience.   Someone will reach out regarding your imaging scheduling.   Take Metamucil or Benefiber daily. Stay hydrated.  Let us know if you need anything.

## 2023-03-01 NOTE — Progress Notes (Signed)
Chief Complaint  Patient presents with   Annual Exam    Right side pain No energy     Well Woman Martha Shannon is here for a complete physical.   Her last physical was >1 year ago.  Current diet: in general, a "healthy" diet. Current exercise: walking. Weight is stable and she confirms fatigue out of ordinary. Seatbelt? Yes  Health Maintenance Pap/HPV- Yes Mammogram- Due Tetanus- Yes Hep C screening- Yes HIV screening- Yes  Fatigue 1 mo. Does not snore. Lost a lot of weight. Mood worse since daughter going to move out.  She does follow with the psychiatry team.  Diet/exercise as above.  Feels that she sleeps well.  R side 3 weeks ago, no inj or change in activity.  Seems to get worse when she has more gas buildup or has to poop.  That seems to relieve some of the pain.  No bruising, redness, swelling, or neurologic signs or symptoms.  There is no pain with specific movements of her trunk.  Patient has a history of scaly skin on and around her scalp.  It is sometimes itchy.  There is no pain, drainage, or spreading.  No close contacts with similar symptoms.  No new lotions, soaps, topicals, or detergents.  Past Medical History:  Diagnosis Date   Anxiety and depression 09/24/2016   Asthma     controlled    Chicken pox    As a child.   Depression    Diabetes mellitus type 2 in obese 09/24/2016   Fatty liver    Kidney stones    Migraines    PONV (postoperative nausea and vomiting)    vomiting with epidural during childbirth      Past Surgical History:  Procedure Laterality Date   CESAREAN SECTION     2006.   LAPAROSCOPIC GASTRIC SLEEVE RESECTION N/A 04/28/2019   Procedure: LAPAROSCOPIC GASTRIC SLEEVE RESECTION, Upper Endo, ERAS Pathway;  Surgeon: Rodman Pickle, MD;  Location: [facility] ORS;  Service: General;  Laterality: N/A;    Medications  Current Outpatient Medications on File Prior to Visit  Medication Sig Dispense Refill   buPROPion (WELLBUTRIN XL) 300 MG  24 hr tablet Take 1 tablet (300 mg total) by mouth daily. 90 tablet 0   hydrOXYzine (ATARAX) 10 MG tablet Take 1-2 tablets (10-20 mg total) by mouth daily AND 2-3 tablets (20-30 mg total) at bedtime. 60 tablet 2   influenza vac split quadrivalent PF (FLUARIX QUADRIVALENT) 0.5 ML injection Inject into the muscle. 0.5 mL 0   lamoTRIgine (LAMICTAL) 150 MG tablet Take 1 tablet (150 mg total) by mouth every morning. 90 tablet 0   LORazepam (ATIVAN) 0.5 MG tablet Take 1 tablet (0.5 mg total) by mouth as needed for anxiety. 20 tablet 0   risperiDONE (RISPERDAL) 0.5 MG tablet Take 1 tablet (0.5 mg total) by mouth at bedtime. 90 tablet 0   rosuvastatin (CRESTOR) 20 MG tablet Take 1 tablet (20 mg total) by mouth daily. 30 tablet 3   tirzepatide (MOUNJARO) 5 MG/0.5ML Pen Inject 5 mg into the skin once a week. 2 mL 0   [DISCONTINUED] phentermine (ADIPEX-P) 37.5 MG tablet Take 1 tablet (37.5 mg total) by mouth daily before breakfast. 30 tablet 0   [DISCONTINUED] QUEtiapine (SEROQUEL) 50 MG tablet Take 1 tablet (50 mg total) by mouth at bedtime. (Patient not taking: Reported on 05/09/2020) 30 tablet 2   [DISCONTINUED] sertraline (ZOLOFT) 100 MG tablet TAKE 1 TABLET BY MOUTH ONCE DAILY 90 tablet 2   [  DISCONTINUED] traZODone (DESYREL) 50 MG tablet TAKE 1/2-1 TABLET (25-50 MG TOTAL) BY MOUTH AT BEDTIME AS NEEDED FOR SLEEP. 30 tablet 3   Allergies Allergies  Allergen Reactions   Naproxen     Legs felt agitated, and restleness     Review of Systems: Constitutional:  no unexpected weight changes Eye:  no recent significant change in vision Ear/Nose/Mouth/Throat:  Ears:  no recent change in hearing Nose/Mouth/Throat:  no complaints of nasal congestion, no sore throat Cardiovascular: no chest pain Respiratory:  no shortness of breath Gastrointestinal:  no abdominal pain, no change in bowel habits GU:  Female: negative for dysuria or pelvic pain Musculoskeletal/Extremities: + Side pain Integumentary  (Skin/Breast): As noted in HPI Neurologic:  no headaches Endocrine: +fatigue Hematologic/Lymphatic:  No areas of easy bleeding  Exam BP 120/78 (BP Location: Left Arm, Patient Position: Sitting, Cuff Size: Normal)   Pulse (!) 48   Temp 97.9 F (36.6 C) (Oral)   Ht 5\' 1"  (1.549 m)   Wt 141 lb 4 oz (64.1 kg)   SpO2 99%   BMI 26.69 kg/m  General:  well developed, well nourished, in no apparent distress Skin: Scaly patches with a pink base around the hairline anteriorly and posteriorly as well as over the scalp.  Otherwise no significant moles, warts, or growths Head:  no masses, lesions, or tenderness Eyes:  pupils equal and round, sclera anicteric without injection Ears:  canals without lesions, TMs shiny without retraction, no obvious effusion, no erythema Nose:  nares patent, mucosa normal, and no drainage Throat/Pharynx:  lips and gingiva without lesion; tongue and uvula midline; non-inflamed pharynx; no exudates or postnasal drainage Neck: neck supple without adenopathy, thyromegaly, or masses Lungs:  clear to auscultation, breath sounds equal bilaterally, no respiratory distress Cardio: Bradycardic and rhythm, no LE edema Abdomen:  abdomen soft, TTP over right portion of abdomen; bowel sounds normal; no masses or organomegaly Genital: Defer to GYN Musculoskeletal: No pain over the right abdominal wall, symmetrical muscle groups noted without atrophy or deformity Extremities:  no clubbing, cyanosis, or edema, no deformities, no skin discoloration Neuro:  gait normal; deep tendon reflexes normal and symmetric Psych: well oriented with normal range of affect and appropriate judgment/insight  Assessment and Plan  Well adult exam - Plan: CBC, Comprehensive metabolic panel, Lipid panel  Type 2 diabetes mellitus with hyperlipidemia (HCC) - Plan: Hemoglobin A1c, Microalbumin / creatinine urine ratio  Breast calcifications - Plan: MM Digital Diagnostic Bilat  Fatigue, unspecified  type - Plan: TSH, T4, free, VITAMIN D 25 Hydroxy (Vit-D Deficiency, Fractures)  Viral pharyngitis - Plan: albuterol (VENTOLIN HFA) 108 (90 Base) MCG/ACT inhaler  Seborrheic dermatitis - Plan: clobetasol (TEMOVATE) 0.05 % external solution   Well 42 y.o. female. Counseled on diet and exercise. BC screening: Dx mammogram ordered today at rec of radiology. Fatigue: Probably worsening depression/anxiety given her daughters moving out.  That is what the patient feels.  Will rule out metabolic causes. Seborrheic dermatitis: Clobetasol solution.  1% hydrocortisone over her scalp. Right-sided abdominal pain: Does not seem musculoskeletal in nature.  Recommended fiber supplement in the powder version and staying hydrated. Other orders as above. Follow up in 6 mo or prn. The patient voiced understanding and agreement to the plan.  Jilda Roche [place], DO 03/01/23 4:44 PM

## 2023-03-02 LAB — MICROALBUMIN / CREATININE URINE RATIO
Creatinine, Urine: 101 mg/dL (ref 20–275)
Microalb Creat Ratio: 11 mg/g creat (ref <30)
Microalb, Ur: 1.1 mg/dL

## 2023-03-04 ENCOUNTER — Other Ambulatory Visit: Payer: Self-pay | Admitting: Family Medicine

## 2023-03-04 DIAGNOSIS — D509 Iron deficiency anemia, unspecified: Secondary | ICD-10-CM

## 2023-03-05 ENCOUNTER — Telehealth: Payer: Self-pay | Admitting: *Deleted

## 2023-03-05 ENCOUNTER — Other Ambulatory Visit (INDEPENDENT_AMBULATORY_CARE_PROVIDER_SITE_OTHER): Payer: 59

## 2023-03-05 ENCOUNTER — Other Ambulatory Visit: Payer: 59

## 2023-03-05 DIAGNOSIS — D509 Iron deficiency anemia, unspecified: Secondary | ICD-10-CM

## 2023-03-05 LAB — LIPID PANEL
Cholesterol: 130 mg/dL (ref <200)
HDL: 58 mg/dL (ref >=50)
LDL Cholesterol (Calc): 60 mg/dL (calc)
Non-HDL Cholesterol (Calc): 72 mg/dL (calc) (ref <130)
Total CHOL/HDL Ratio: 2.2 (calc) (ref <5.0)
Triglycerides: 43 mg/dL (ref <150)

## 2023-03-05 LAB — IRON,TIBC AND FERRITIN PANEL
%SAT: 3 % (calc) — ABNORMAL LOW (ref 16–45)
Ferritin: 3 ng/mL — ABNORMAL LOW (ref 16–232)
Iron: 15 ug/dL — ABNORMAL LOW (ref 40–190)
TIBC: 492 mcg/dL (calc) — ABNORMAL HIGH (ref 250–450)

## 2023-03-05 LAB — COMPREHENSIVE METABOLIC PANEL
AG Ratio: 1.8 (calc) (ref 1.0–2.5)
ALT: 11 U/L (ref 6–29)
AST: 13 U/L (ref 10–30)
Albumin: 4.4 g/dL (ref 3.6–5.1)
Alkaline phosphatase (APISO): 47 U/L (ref 31–125)
BUN: 10 mg/dL (ref 7–25)
CO2: 22 mmol/L (ref 20–32)
Calcium: 9.6 mg/dL (ref 8.6–10.2)
Chloride: 102 mmol/L (ref 98–110)
Creat: 0.53 mg/dL (ref 0.50–0.99)
Globulin: 2.4 g/dL (calc) (ref 1.9–3.7)
Glucose, Bld: 71 mg/dL (ref 65–99)
Potassium: 4 mmol/L (ref 3.5–5.3)
Sodium: 137 mmol/L (ref 135–146)
Total Bilirubin: 0.3 mg/dL (ref 0.2–1.2)
Total Protein: 6.8 g/dL (ref 6.1–8.1)

## 2023-03-05 LAB — HEMOGLOBIN A1C
Hgb A1c MFr Bld: 5.3 % of total Hgb (ref <5.7)
Mean Plasma Glucose: 105 mg/dL
eAG (mmol/L): 5.8 mmol/L

## 2023-03-05 LAB — T4, FREE: Free T4: 1.1 ng/dL (ref 0.8–1.8)

## 2023-03-05 LAB — CBC
HCT: 32.6 % — ABNORMAL LOW (ref 35.0–45.0)
Hemoglobin: 9.7 g/dL — ABNORMAL LOW (ref 11.7–15.5)
MCH: 20.7 pg — ABNORMAL LOW (ref 27.0–33.0)
MCHC: 29.8 g/dL — ABNORMAL LOW (ref 32.0–36.0)
MCV: 69.7 fL — ABNORMAL LOW (ref 80.0–100.0)
MPV: 10.5 fL (ref 7.5–12.5)
Platelets: 405 10*3/uL — ABNORMAL HIGH (ref 140–400)
RBC: 4.68 10*6/uL (ref 3.80–5.10)
RDW: 19.5 % — ABNORMAL HIGH (ref 11.0–15.0)
WBC: 6.9 10*3/uL (ref 3.8–10.8)

## 2023-03-05 LAB — TSH: TSH: 1.69 mIU/L

## 2023-03-05 LAB — VITAMIN D 25 HYDROXY (VIT D DEFICIENCY, FRACTURES): Vit D, 25-Hydroxy: 29 ng/mL — ABNORMAL LOW (ref 30–100)

## 2023-03-05 NOTE — Telephone Encounter (Signed)
Don't worry for now. Let's get her in for a lab visit, order a FIT and urine micro. Sched for labs in 1 mo and order a CBC and Fe studies. Ty.

## 2023-03-05 NOTE — Telephone Encounter (Signed)
From mychart  Good morning, I received a message from Dr. Carmelia Roller about my Iron levels. He asked me about my  Menstrual cycle. It is not as heavy as it use to be. I will start taking iron supplements but is that enough to bring my levels up. I am a little concerned.  Please call me or message me.  Sorry to message here I didn't know where else to reply. Thank you!

## 2023-03-05 NOTE — Telephone Encounter (Signed)
Phone message sent to provider

## 2023-03-05 NOTE — Addendum Note (Signed)
Addended by: Scharlene Gloss B on: 03/05/2023 01:20 PM   Modules accepted: Orders

## 2023-03-05 NOTE — Telephone Encounter (Signed)
Called left msg to call back. 

## 2023-03-05 NOTE — Telephone Encounter (Signed)
Called left message to call back. Put in future order for IFOB and urine microscopic only

## 2023-03-05 NOTE — Telephone Encounter (Signed)
Schedule 1 month lab appt Put in the order.

## 2023-03-05 NOTE — Telephone Encounter (Signed)
  Hi Laquia- Iron levels are low. Are your cycles heavy? Please let me know. In the meanwhile, take an over the counter iron supplement (Ferrous sulfate) 1-3 times daily as your stomach will allow.

## 2023-03-05 NOTE — Telephone Encounter (Signed)
Pt was scheduled for labs today at 3:15. She also wanted to mention she was at the ER downstairs today because she felt like she was going to pass out. She stated she was evaluated, bp 130/80 and blood sugar was 105. She stated they told her she was fine but she still does not feel back to normal.

## 2023-03-05 NOTE — Addendum Note (Signed)
Addended by: Scharlene Gloss B on: 03/05/2023 01:22 PM   Modules accepted: Orders

## 2023-03-06 ENCOUNTER — Other Ambulatory Visit (INDEPENDENT_AMBULATORY_CARE_PROVIDER_SITE_OTHER): Payer: 59

## 2023-03-06 ENCOUNTER — Other Ambulatory Visit (HOSPITAL_BASED_OUTPATIENT_CLINIC_OR_DEPARTMENT_OTHER): Payer: Self-pay

## 2023-03-06 DIAGNOSIS — D509 Iron deficiency anemia, unspecified: Secondary | ICD-10-CM

## 2023-03-06 LAB — URINALYSIS, MICROSCOPIC ONLY

## 2023-03-06 NOTE — Telephone Encounter (Signed)
Called left message to call back 

## 2023-03-06 NOTE — Telephone Encounter (Signed)
Called and informed the patient.

## 2023-03-07 LAB — FECAL OCCULT BLOOD, IMMUNOCHEMICAL: Fecal Occult Bld: NEGATIVE

## 2023-03-22 ENCOUNTER — Other Ambulatory Visit: Payer: Self-pay

## 2023-03-22 ENCOUNTER — Other Ambulatory Visit (HOSPITAL_BASED_OUTPATIENT_CLINIC_OR_DEPARTMENT_OTHER): Payer: Self-pay

## 2023-03-22 ENCOUNTER — Other Ambulatory Visit (HOSPITAL_COMMUNITY): Payer: Self-pay | Admitting: Psychiatry

## 2023-03-22 DIAGNOSIS — F419 Anxiety disorder, unspecified: Secondary | ICD-10-CM

## 2023-03-22 DIAGNOSIS — F331 Major depressive disorder, recurrent, moderate: Secondary | ICD-10-CM

## 2023-04-05 ENCOUNTER — Other Ambulatory Visit: Payer: 59

## 2023-04-15 ENCOUNTER — Other Ambulatory Visit (HOSPITAL_BASED_OUTPATIENT_CLINIC_OR_DEPARTMENT_OTHER): Payer: Self-pay

## 2023-04-19 ENCOUNTER — Other Ambulatory Visit (HOSPITAL_BASED_OUTPATIENT_CLINIC_OR_DEPARTMENT_OTHER): Payer: Self-pay

## 2023-04-30 ENCOUNTER — Other Ambulatory Visit (HOSPITAL_BASED_OUTPATIENT_CLINIC_OR_DEPARTMENT_OTHER): Payer: Self-pay

## 2023-05-01 ENCOUNTER — Other Ambulatory Visit (HOSPITAL_BASED_OUTPATIENT_CLINIC_OR_DEPARTMENT_OTHER): Payer: Self-pay

## 2023-05-01 ENCOUNTER — Other Ambulatory Visit (HOSPITAL_COMMUNITY): Payer: Self-pay | Admitting: Psychiatry

## 2023-05-01 ENCOUNTER — Other Ambulatory Visit: Payer: Self-pay

## 2023-05-01 ENCOUNTER — Other Ambulatory Visit: Payer: Self-pay | Admitting: Family Medicine

## 2023-05-01 DIAGNOSIS — L219 Seborrheic dermatitis, unspecified: Secondary | ICD-10-CM

## 2023-05-01 DIAGNOSIS — F331 Major depressive disorder, recurrent, moderate: Secondary | ICD-10-CM

## 2023-05-01 DIAGNOSIS — F419 Anxiety disorder, unspecified: Secondary | ICD-10-CM

## 2023-05-01 MED ORDER — CLOBETASOL PROPIONATE 0.05 % EX SOLN
1.0000 | Freq: Two times a day (BID) | CUTANEOUS | 0 refills | Status: AC
Start: 2023-05-01 — End: ?
  Filled 2023-05-01 – 2024-04-17 (×3): qty 50, 25d supply, fill #0

## 2023-05-02 ENCOUNTER — Other Ambulatory Visit (HOSPITAL_BASED_OUTPATIENT_CLINIC_OR_DEPARTMENT_OTHER): Payer: Self-pay

## 2023-05-14 ENCOUNTER — Other Ambulatory Visit (HOSPITAL_BASED_OUTPATIENT_CLINIC_OR_DEPARTMENT_OTHER): Payer: Self-pay

## 2023-05-16 ENCOUNTER — Other Ambulatory Visit (HOSPITAL_COMMUNITY): Payer: Self-pay | Admitting: Psychiatry

## 2023-05-16 DIAGNOSIS — F331 Major depressive disorder, recurrent, moderate: Secondary | ICD-10-CM

## 2023-05-16 DIAGNOSIS — F419 Anxiety disorder, unspecified: Secondary | ICD-10-CM

## 2023-05-17 ENCOUNTER — Other Ambulatory Visit (HOSPITAL_BASED_OUTPATIENT_CLINIC_OR_DEPARTMENT_OTHER): Payer: Self-pay

## 2023-05-17 ENCOUNTER — Other Ambulatory Visit: Payer: Self-pay

## 2023-05-17 ENCOUNTER — Encounter (HOSPITAL_BASED_OUTPATIENT_CLINIC_OR_DEPARTMENT_OTHER): Payer: Self-pay

## 2023-05-20 ENCOUNTER — Other Ambulatory Visit (HOSPITAL_BASED_OUTPATIENT_CLINIC_OR_DEPARTMENT_OTHER): Payer: Self-pay

## 2023-05-21 ENCOUNTER — Other Ambulatory Visit (HOSPITAL_BASED_OUTPATIENT_CLINIC_OR_DEPARTMENT_OTHER): Payer: Self-pay

## 2023-05-21 ENCOUNTER — Other Ambulatory Visit (HOSPITAL_COMMUNITY): Payer: Self-pay

## 2023-05-21 DIAGNOSIS — F419 Anxiety disorder, unspecified: Secondary | ICD-10-CM

## 2023-05-21 DIAGNOSIS — F331 Major depressive disorder, recurrent, moderate: Secondary | ICD-10-CM

## 2023-05-21 MED ORDER — RISPERIDONE 0.5 MG PO TABS
0.5000 mg | ORAL_TABLET | Freq: Every day | ORAL | 0 refills | Status: DC
  Filled 2023-05-21: qty 30, 30d supply, fill #0

## 2023-05-28 ENCOUNTER — Encounter (HOSPITAL_COMMUNITY): Payer: Self-pay | Admitting: Psychiatry

## 2023-05-28 ENCOUNTER — Other Ambulatory Visit: Payer: Self-pay

## 2023-05-28 ENCOUNTER — Other Ambulatory Visit (HOSPITAL_BASED_OUTPATIENT_CLINIC_OR_DEPARTMENT_OTHER): Payer: Self-pay

## 2023-05-28 ENCOUNTER — Telehealth (HOSPITAL_BASED_OUTPATIENT_CLINIC_OR_DEPARTMENT_OTHER): Payer: 59 | Admitting: Psychiatry

## 2023-05-28 VITALS — Wt 141.0 lb

## 2023-05-28 DIAGNOSIS — F331 Major depressive disorder, recurrent, moderate: Secondary | ICD-10-CM | POA: Diagnosis not present

## 2023-05-28 DIAGNOSIS — F419 Anxiety disorder, unspecified: Secondary | ICD-10-CM | POA: Diagnosis not present

## 2023-05-28 MED ORDER — LAMOTRIGINE 150 MG PO TABS
150.0000 mg | ORAL_TABLET | Freq: Every morning | ORAL | 0 refills | Status: DC
  Filled 2023-05-28 – 2023-07-16 (×3): qty 90, 90d supply, fill #0

## 2023-05-28 MED ORDER — BUPROPION HCL ER (XL) 300 MG PO TB24
300.0000 mg | ORAL_TABLET | Freq: Every day | ORAL | 0 refills | Status: DC
  Filled 2023-05-28 – 2023-07-16 (×2): qty 90, 90d supply, fill #0

## 2023-05-28 MED ORDER — RISPERIDONE 0.5 MG PO TABS
0.5000 mg | ORAL_TABLET | Freq: Every day | ORAL | 0 refills | Status: DC
  Filled 2023-05-28 – 2023-06-17 (×2): qty 90, 90d supply, fill #0

## 2023-05-28 MED ORDER — HYDROXYZINE HCL 10 MG PO TABS
ORAL_TABLET | ORAL | 2 refills | Status: DC
  Filled 2023-05-28 – 2023-07-29 (×2): qty 60, 12d supply, fill #0
  Filled 2023-08-26: qty 60, 12d supply, fill #1
  Filled 2023-09-12: qty 60, 12d supply, fill #2

## 2023-05-28 NOTE — Progress Notes (Signed)
[facility] MD Virtual Progress Note   Patient Location: In Car Provider Location: Home Office  I connect with patient by video and verified that I am speaking with correct person by using two identifiers. I discussed the limitations of evaluation and management by telemedicine and the availability of in person appointments. I also discussed with the patient that there may be a patient responsible charge related to this service. The patient expressed understanding and agreed to proceed.  Martha Shannon [id number] 42 y.o.  05/28/2023 11:43 AM  History of Present Illness:  Patient is evaluated by video session.  She apologized missing last appointment.  She was sick.  She is taking all her medication as prescribed.  She takes hydroxyzine every night and sometimes second pill if she feels very nervous and anxious and cannot sleep.  She reported work is busy but manageable.  She denies any crying spells or any feeling of hopelessness or worthlessness.  She denies any mania, psychosis, anger.  Her appetite is okay.  Her weight is stable.  Recently she had a blood work and labs are normal.  She has no rash, itching, tremors, shakes or any EPS.  She like to continue current medication which are Wellbutrin, Risperdal, Lamictal and hydroxyzine.  She has not taken Ativan in more than 6 months.  She denies any feeling of hopelessness or any suicidal thoughts.  Past Psychiatric History: H/O depression anxiety since age 27.  No h/o inpatient treatment, suicidal attempt, psychosis, abuse.  PCP tried Lexapro that stop working after 1 year, Abilify make mood worst. Buspar, Seroquel, Zoloft, Klonopin, Xanax, gabapentin and Strattera did not work.    Outpatient Encounter Medications as of 05/28/2023  Medication Sig   albuterol (VENTOLIN HFA) 108 (90 Base) MCG/ACT inhaler Inhale 2 puffs into the lungs every 4 (four) hours as needed for wheezing or shortness of breath.   buPROPion (WELLBUTRIN XL)  300 MG 24 hr tablet Take 1 tablet (300 mg total) by mouth daily.   clobetasol (TEMOVATE) 0.05 % external solution Apply 1 Application topically 2 (two) times daily.   hydrOXYzine (ATARAX) 10 MG tablet Take 1-2 tablets (10-20 mg total) by mouth daily AND 2-3 tablets (20-30 mg total) at bedtime.   lamoTRIgine (LAMICTAL) 150 MG tablet Take 1 tablet (150 mg total) by mouth every morning.   LORazepam (ATIVAN) 0.5 MG tablet Take 1 tablet (0.5 mg total) by mouth as needed for anxiety.   risperiDONE (RISPERDAL) 0.5 MG tablet Take 1 tablet (0.5 mg total) by mouth at bedtime. 30 day supply to get pt to appt 05/28/23   rosuvastatin (CRESTOR) 20 MG tablet Take 1 tablet (20 mg total) by mouth daily.   tirzepatide [facility]) 5 MG/0.5ML Pen Inject 5 mg into the skin once a week.   [DISCONTINUED] phentermine (ADIPEX-P) 37.5 MG tablet Take 1 tablet (37.5 mg total) by mouth daily before breakfast.   [DISCONTINUED] QUEtiapine (SEROQUEL) 50 MG tablet Take 1 tablet (50 mg total) by mouth at bedtime. (Patient not taking: Reported on 05/09/2020)   [DISCONTINUED] sertraline (ZOLOFT) 100 MG tablet TAKE 1 TABLET BY MOUTH ONCE DAILY   [DISCONTINUED] traZODone (DESYREL) 50 MG tablet TAKE 1/2-1 TABLET (25-50 MG TOTAL) BY MOUTH AT BEDTIME AS NEEDED FOR SLEEP.   No facility-administered encounter medications on file as of 05/28/2023.    Recent Results (from the past 2160 hour(s))  CBC     Status: Abnormal   Collection Time: 03/01/23  4:02 PM  Result Value Ref Range   WBC  6.9 3.8 - 10.8 Thousand/uL   RBC 4.68 3.80 - 5.10 Million/uL   Hemoglobin 9.7 (L) 11.7 - 15.5 g/dL   HCT 14.7 (L) 82.9 - 56.2 %   MCV 69.7 (L) 80.0 - 100.0 fL   MCH 20.7 (L) 27.0 - 33.0 pg   MCHC 29.8 (L) 32.0 - 36.0 g/dL   RDW 13.0 (H) 86.5 - 78.4 %   Platelets 405 (H) 140 - 400 Thousand/uL   MPV 10.5 7.5 - 12.5 fL  Comprehensive metabolic panel     Status: None   Collection Time: 03/01/23  4:02 PM  Result Value Ref Range   Glucose, Bld 71 65 - 99  mg/dL    Comment: .            Fasting reference interval .    BUN 10 7 - 25 mg/dL   Creat 6.96 2.95 - 2.84 mg/dL   BUN/Creatinine Ratio SEE NOTE: 6 - 22 (calc)    Comment:    Not Reported: BUN and Creatinine are within    reference range. .    Sodium 137 135 - 146 mmol/L   Potassium 4.0 3.5 - 5.3 mmol/L   Chloride 102 98 - 110 mmol/L   CO2 22 20 - 32 mmol/L   Calcium 9.6 8.6 - 10.2 mg/dL   Total Protein 6.8 6.1 - 8.1 g/dL   Albumin 4.4 3.6 - 5.1 g/dL   Globulin 2.4 1.9 - 3.7 g/dL (calc)   AG Ratio 1.8 1.0 - 2.5 (calc)   Total Bilirubin 0.3 0.2 - 1.2 mg/dL   Alkaline phosphatase (APISO) 47 31 - 125 U/L   AST 13 10 - 30 U/L   ALT 11 6 - 29 U/L  Lipid panel     Status: None   Collection Time: 03/01/23  4:02 PM  Result Value Ref Range   Cholesterol 130 <200 mg/dL   HDL 58 > OR = 50 mg/dL   Triglycerides 43 <132 mg/dL   LDL Cholesterol (Calc) 60 mg/dL (calc)    Comment: Reference range: <100 . Desirable range <100 mg/dL for primary prevention;   <70 mg/dL for patients with CHD or diabetic patients  with > or = 2 CHD risk factors. Marland Kitchen LDL-C is now calculated using the Martin-Hopkins  calculation, which is a validated novel method providing  better accuracy than the Friedewald equation in the  estimation of LDL-C.  Horald Pollen et al. Lenox Ahr. 4401;027(25): 2061-2068  (http://education.[facility].com/faq/FAQ164)    Total CHOL/HDL Ratio 2.2 <5.0 (calc)   Non-HDL Cholesterol (Calc) 72 <366 mg/dL (calc)    Comment: For patients with diabetes plus 1 major ASCVD risk  factor, treating to a non-HDL-C goal of <100 mg/dL  (LDL-C of <44 mg/dL) is considered a therapeutic  option.   Hemoglobin A1c     Status: None   Collection Time: 03/01/23  4:02 PM  Result Value Ref Range   Hgb A1c MFr Bld 5.3 <5.7 % of total Hgb    Comment: For the purpose of screening for the presence of diabetes: . <5.7%       Consistent with the absence of diabetes 5.7-6.4%    Consistent with increased  risk for diabetes             (prediabetes) > or =6.5%  Consistent with diabetes . This assay result is consistent with a decreased risk of diabetes. . Currently, no consensus exists regarding use of hemoglobin A1c for diagnosis of diabetes in children. . According to American Diabetes  Association (ADA) guidelines, hemoglobin A1c <7.0% represents optimal control in non-pregnant diabetic patients. Different metrics may apply to specific patient populations.  Standards of Medical Care in Diabetes(ADA). .    Mean Plasma Glucose 105 mg/dL   eAG (mmol/L) 5.8 mmol/L    Comment: . This test was performed on the Roche cobas c503 platform. Effective 06/04/22, a change in test platforms from the Abbott Architect to the Roche cobas c503 may have shifted HbA1c results compared to historical results. Based on laboratory validation testing conducted at [facility], the Roche platform relative to the Abbott platform had an average increase in HbA1c value of < or = 0.3%. This difference is within accepted  variability established by the [facility]. Note that not all individuals will have had a shift in their results and direct comparisons between historical and current results for testing conducted on different platforms is not recommended.   Microalbumin / creatinine urine ratio     Status: None   Collection Time: 03/01/23  4:02 PM  Result Value Ref Range   Creatinine, Urine 101 20 - 275 mg/dL   Microalb, Ur 1.1 mg/dL    Comment: Reference Range Not established    Microalb Creat Ratio 11 <30 mg/g creat    Comment: . The ADA defines abnormalities in albumin excretion as follows: Marland Kitchen Albuminuria Category        Result (mg/g creatinine) . Normal to Mildly increased   <30 Moderately increased         30-299  Severely increased           > OR = 300 . The ADA recommends that at least two of three specimens collected within a 3-6 month period be abnormal  before considering a patient to be within a diagnostic category.   TSH     Status: None   Collection Time: 03/01/23  4:02 PM  Result Value Ref Range   TSH 1.69 mIU/L    Comment:           Reference Range .           > or = 20 Years  0.40-4.50 .                Pregnancy Ranges           First trimester    0.26-2.66           Second trimester   0.55-2.73           Third trimester    0.43-2.91   T4, free     Status: None   Collection Time: 03/01/23  4:02 PM  Result Value Ref Range   Free T4 1.1 0.8 - 1.8 ng/dL  VITAMIN D 25 Hydroxy (Vit-D Deficiency, Fractures)     Status: Abnormal   Collection Time: 03/01/23  4:02 PM  Result Value Ref Range   Vit D, 25-Hydroxy 29 (L) 30 - 100 ng/mL    Comment: Vitamin D Status         25-OH Vitamin D: . Deficiency:                    <20 ng/mL Insufficiency:             20 - 29 ng/mL Optimal:                 > or = 30 ng/mL . For 25-OH Vitamin D testing on patients on  D2-supplementation and patients for whom quantitation  of D2 and D3 fractions is required, the [facility]TM) 25-OH VIT D, (D2,D3), LC/MS/MS is recommended: order  code 65784 (patients >60yrs). . See Note 1 . Note 1 . For additional information, please refer to  http://education.[facility].com/faq/FAQ199  (This link is being provided for informational/ educational purposes only.)   Iron, TIBC and Ferritin Panel     Status: Abnormal   Collection Time: 03/01/23  4:02 PM  Result Value Ref Range   Iron 15 (L) 40 - 190 mcg/dL   TIBC 696 (H) 295 - 284 mcg/dL (calc)   %SAT 3 (L) 16 - 45 % (calc)   Ferritin 3 (L) 16 - 232 ng/mL  Urine Microscopic Only     Status: Abnormal   Collection Time: 03/05/23  1:41 PM  Result Value Ref Range   WBC, UA 0-2/hpf 0-2/hpf   RBC / HPF 0-2/hpf 0-2/hpf   Mucus, UA Presence of (A) None   Squamous Epithelial / HPF Few(5-10/hpf) (A) Rare(0-4/hpf)   Bacteria, UA Rare(<10/hpf) (A) None   Ca Oxalate Crys, UA Presence of (A) None    Yeast, UA Presence of (A) None  Fecal occult blood, imunochemical([facility])     Status: None   Collection Time: 03/06/23 10:58 AM   Specimen: Stool  Result Value Ref Range   Fecal Occult Bld Negative Negative     Psychiatric Specialty Exam: Physical Exam  Review of Systems  Weight 141 lb (64 kg).There is no height or weight on file to calculate BMI.  General Appearance: Casual  Eye Contact:  Good  Speech:  Clear and Coherent and Normal Rate  Volume:  Normal  Mood:  Euthymic  Affect:  Appropriate  Thought Process:  Goal Directed  Orientation:  Full (Time, Place, and Person)  Thought Content:  Logical  Suicidal Thoughts:  No  Homicidal Thoughts:  No  Memory:  Immediate;   Good Recent;   Good Remote;   Good  Judgement:  Good  Insight:  Present  Psychomotor Activity:  Normal  Concentration:  Concentration: Good and Attention Span: Good  Recall:  Good  Fund of Knowledge:  Good  Language:  Good  Akathisia:  No  Handed:  Right  AIMS (if indicated):     Assets:  Communication Skills Desire for Improvement Housing Resilience Social Support Talents/Skills Transportation  ADL's:  Intact  Cognition:  WNL  Sleep:  ok     Assessment/Plan: MDD (major depressive disorder), recurrent episode, moderate (HCC) - Plan: buPROPion (WELLBUTRIN XL) 300 MG 24 hr tablet, lamoTRIgine (LAMICTAL) 150 MG tablet, risperiDONE (RISPERDAL) 0.5 MG tablet  Anxiety - Plan: buPROPion (WELLBUTRIN XL) 300 MG 24 hr tablet, hydrOXYzine (ATARAX) 10 MG tablet, lamoTRIgine (LAMICTAL) 150 MG tablet, risperiDONE (RISPERDAL) 0.5 MG tablet  Patient is stable on current medication.  She takes the hydroxyzine that helps her anxiety and sleep.  Continue risperidone 0.5 mg at bedtime, hydroxyzine 10-20 mg at bedtime for anxiety and insomnia continue Wellbutrin XL 300 mg daily and Lamictal 150 mg daily.  Recommend to call us back if any question or any concern.  Follow-up in 3 months   Follow Up  Instructions:     I discussed the assessment and treatment plan with the patient. The patient was provided an opportunity to ask questions and all were answered. The patient agreed with the plan and demonstrated an understanding of the instructions.   The patient was advised to call back or seek an in-person evaluation if the symptoms worsen or if the condition fails to improve as  anticipated.    Collaboration of Care: Other provider involved in patient's care AEB notes are available in epic to review  Patient/Guardian was advised Release of Information must be obtained prior to any record release in order to collaborate their care with an outside provider. Patient/Guardian was advised if they have not already done so to contact the registration department to sign all necessary forms in order for [place] to release information regarding their care.   Consent: Patient/Guardian gives verbal consent for treatment and assignment of benefits for services provided during this visit. Patient/Guardian expressed understanding and agreed to proceed.     I provided 18 minutes of non face to face time during this encounter.  Note: This document was prepared by [employer] voice dictation technology and any errors that results from this process are unintentional.    Cleotis Nipper, MD 05/28/2023

## 2023-06-05 ENCOUNTER — Other Ambulatory Visit (HOSPITAL_BASED_OUTPATIENT_CLINIC_OR_DEPARTMENT_OTHER): Payer: Self-pay

## 2023-06-05 MED ORDER — FLULAVAL 0.5 ML IM SUSY
0.5000 mL | PREFILLED_SYRINGE | Freq: Once | INTRAMUSCULAR | 0 refills | Status: AC
  Filled 2023-06-05: qty 0.5, 1d supply, fill #0

## 2023-06-06 ENCOUNTER — Other Ambulatory Visit (HOSPITAL_BASED_OUTPATIENT_CLINIC_OR_DEPARTMENT_OTHER): Payer: Self-pay

## 2023-06-13 ENCOUNTER — Other Ambulatory Visit (HOSPITAL_BASED_OUTPATIENT_CLINIC_OR_DEPARTMENT_OTHER): Payer: Self-pay

## 2023-06-17 ENCOUNTER — Other Ambulatory Visit: Payer: Self-pay | Admitting: Family Medicine

## 2023-06-18 ENCOUNTER — Other Ambulatory Visit (HOSPITAL_BASED_OUTPATIENT_CLINIC_OR_DEPARTMENT_OTHER): Payer: Self-pay

## 2023-06-18 ENCOUNTER — Other Ambulatory Visit: Payer: Self-pay

## 2023-06-18 MED ORDER — ROSUVASTATIN CALCIUM 20 MG PO TABS
20.0000 mg | ORAL_TABLET | Freq: Every day | ORAL | 3 refills | Status: AC
  Filled 2023-06-18: qty 90, 90d supply, fill #0
  Filled 2023-09-24: qty 30, 30d supply, fill #1

## 2023-07-01 ENCOUNTER — Other Ambulatory Visit (HOSPITAL_BASED_OUTPATIENT_CLINIC_OR_DEPARTMENT_OTHER): Payer: Self-pay

## 2023-07-02 ENCOUNTER — Other Ambulatory Visit (HOSPITAL_BASED_OUTPATIENT_CLINIC_OR_DEPARTMENT_OTHER): Payer: Self-pay

## 2023-07-03 ENCOUNTER — Other Ambulatory Visit (HOSPITAL_BASED_OUTPATIENT_CLINIC_OR_DEPARTMENT_OTHER): Payer: Self-pay

## 2023-07-05 ENCOUNTER — Other Ambulatory Visit (HOSPITAL_BASED_OUTPATIENT_CLINIC_OR_DEPARTMENT_OTHER): Payer: Self-pay

## 2023-07-16 ENCOUNTER — Other Ambulatory Visit (HOSPITAL_BASED_OUTPATIENT_CLINIC_OR_DEPARTMENT_OTHER): Payer: Self-pay

## 2023-07-17 ENCOUNTER — Other Ambulatory Visit (HOSPITAL_BASED_OUTPATIENT_CLINIC_OR_DEPARTMENT_OTHER): Payer: Self-pay

## 2023-07-24 ENCOUNTER — Other Ambulatory Visit (HOSPITAL_BASED_OUTPATIENT_CLINIC_OR_DEPARTMENT_OTHER): Payer: Self-pay

## 2023-07-30 ENCOUNTER — Other Ambulatory Visit (HOSPITAL_BASED_OUTPATIENT_CLINIC_OR_DEPARTMENT_OTHER): Payer: Self-pay

## 2023-08-27 ENCOUNTER — Other Ambulatory Visit (HOSPITAL_BASED_OUTPATIENT_CLINIC_OR_DEPARTMENT_OTHER): Payer: Self-pay

## 2023-08-27 ENCOUNTER — Other Ambulatory Visit: Payer: Self-pay

## 2023-08-29 ENCOUNTER — Other Ambulatory Visit (HOSPITAL_BASED_OUTPATIENT_CLINIC_OR_DEPARTMENT_OTHER): Payer: Self-pay

## 2023-08-29 ENCOUNTER — Telehealth (HOSPITAL_COMMUNITY): Payer: 59 | Admitting: Psychiatry

## 2023-09-03 ENCOUNTER — Telehealth (HOSPITAL_COMMUNITY): Payer: 59 | Admitting: Psychiatry

## 2023-09-12 ENCOUNTER — Other Ambulatory Visit (HOSPITAL_BASED_OUTPATIENT_CLINIC_OR_DEPARTMENT_OTHER): Payer: Self-pay

## 2023-09-12 ENCOUNTER — Other Ambulatory Visit (HOSPITAL_COMMUNITY): Payer: Self-pay | Admitting: Psychiatry

## 2023-09-12 DIAGNOSIS — F331 Major depressive disorder, recurrent, moderate: Secondary | ICD-10-CM

## 2023-09-12 DIAGNOSIS — F419 Anxiety disorder, unspecified: Secondary | ICD-10-CM

## 2023-09-23 ENCOUNTER — Other Ambulatory Visit: Payer: Self-pay

## 2023-09-23 ENCOUNTER — Other Ambulatory Visit (HOSPITAL_COMMUNITY): Payer: Self-pay

## 2023-09-23 ENCOUNTER — Other Ambulatory Visit (HOSPITAL_BASED_OUTPATIENT_CLINIC_OR_DEPARTMENT_OTHER): Payer: Self-pay

## 2023-09-23 DIAGNOSIS — F331 Major depressive disorder, recurrent, moderate: Secondary | ICD-10-CM

## 2023-09-23 DIAGNOSIS — F419 Anxiety disorder, unspecified: Secondary | ICD-10-CM

## 2023-09-23 MED ORDER — RISPERIDONE 0.5 MG PO TABS
0.5000 mg | ORAL_TABLET | Freq: Every day | ORAL | 0 refills | Status: DC
  Filled 2023-09-23: qty 30, 30d supply, fill #0

## 2023-09-24 ENCOUNTER — Other Ambulatory Visit (HOSPITAL_BASED_OUTPATIENT_CLINIC_OR_DEPARTMENT_OTHER): Payer: Self-pay

## 2023-09-26 ENCOUNTER — Encounter (HOSPITAL_COMMUNITY): Payer: Self-pay | Admitting: Psychiatry

## 2023-09-26 ENCOUNTER — Other Ambulatory Visit (HOSPITAL_BASED_OUTPATIENT_CLINIC_OR_DEPARTMENT_OTHER): Payer: Self-pay

## 2023-09-26 ENCOUNTER — Telehealth (HOSPITAL_COMMUNITY): Payer: 59 | Admitting: Psychiatry

## 2023-09-26 VITALS — Wt 141.0 lb

## 2023-09-26 DIAGNOSIS — F419 Anxiety disorder, unspecified: Secondary | ICD-10-CM | POA: Diagnosis not present

## 2023-09-26 DIAGNOSIS — F331 Major depressive disorder, recurrent, moderate: Secondary | ICD-10-CM

## 2023-09-26 MED ORDER — BUPROPION HCL ER (XL) 300 MG PO TB24
300.0000 mg | ORAL_TABLET | Freq: Every day | ORAL | 0 refills | Status: DC
  Filled 2023-09-26 – 2023-10-18 (×3): qty 90, 90d supply, fill #0

## 2023-09-26 MED ORDER — LAMOTRIGINE 150 MG PO TABS
150.0000 mg | ORAL_TABLET | Freq: Every morning | ORAL | 0 refills | Status: DC
  Filled 2023-09-26 – 2023-10-18 (×3): qty 90, 90d supply, fill #0

## 2023-09-26 MED ORDER — RISPERIDONE 0.5 MG PO TABS
0.5000 mg | ORAL_TABLET | Freq: Every day | ORAL | 0 refills | Status: DC
  Filled 2023-09-26 – 2023-10-18 (×2): qty 90, 90d supply, fill #0

## 2023-09-26 MED ORDER — HYDROXYZINE HCL 10 MG PO TABS
10.0000 mg | ORAL_TABLET | Freq: Every day | ORAL | 2 refills | Status: DC
  Filled 2023-09-26: qty 30, 30d supply, fill #0
  Filled 2023-11-13: qty 30, 30d supply, fill #1
  Filled 2023-12-10: qty 30, 30d supply, fill #2

## 2023-09-26 NOTE — Progress Notes (Signed)
[facility] MD Virtual Progress Note   Patient Location: Home Provider Location: Office  I connect with patient by video and verified that I am speaking with correct person by using two identifiers. I discussed the limitations of evaluation and management by telemedicine and the availability of in person appointments. I also discussed with the patient that there may be a patient responsible charge related to this service. The patient expressed understanding and agreed to proceed.  Martha Shannon [id number] 43 y.o.  09/26/2023 4:34 PM  History of Present Illness:  Patient is evaluated by video session.  She is stable on current medication.  Her job is going very well however there are times when some challenges but she is managing better than she anticipated.  She had a good and quite Christmas with her husband.  She denies any crying spells or any feeling of hopelessness or worthlessness.  Her appetite is okay.  Her weight is stable.  She sleeps good with the help of hydroxyzine.  She denies any panic attack.  She denies any suicidal thoughts.  She has no tremor or shakes or any EPS.  She is compliant with Wellbutrin, respite all, hydroxyzine and Lamictal.  She has no rash, itching, tremors or shakes.  She has not taken Ativan more than 6 months.  She denies drinking or using any illegal substances.  She lives with her husband.  She wants to continue current medication.    Past Psychiatric History:    Outpatient Encounter Medications as of 09/26/2023  Medication Sig   albuterol (VENTOLIN HFA) 108 (90 Base) MCG/ACT inhaler Inhale 2 puffs into the lungs every 4 (four) hours as needed for wheezing or shortness of breath.   buPROPion (WELLBUTRIN XL) 300 MG 24 hr tablet Take 1 tablet (300 mg total) by mouth daily.   clobetasol (TEMOVATE) 0.05 % external solution Apply 1 Application topically 2 (two) times daily.   hydrOXYzine (ATARAX) 10 MG tablet Take 1-2 tablets (10-20 mg total)  by mouth daily AND 2-3 tablets (20-30 mg total) at bedtime.   lamoTRIgine (LAMICTAL) 150 MG tablet Take 1 tablet (150 mg total) by mouth every morning.   risperiDONE (RISPERDAL) 0.5 MG tablet Take 1 tablet (0.5 mg total) by mouth at bedtime. 30 day supply to get pt to appt 05/28/23   rosuvastatin (CRESTOR) 20 MG tablet Take 1 tablet (20 mg total) by mouth daily.   tirzepatide [facility]) 5 MG/0.5ML Pen Inject 5 mg into the skin once a week.   [DISCONTINUED] phentermine (ADIPEX-P) 37.5 MG tablet Take 1 tablet (37.5 mg total) by mouth daily before breakfast.   [DISCONTINUED] QUEtiapine (SEROQUEL) 50 MG tablet Take 1 tablet (50 mg total) by mouth at bedtime. (Patient not taking: Reported on 05/09/2020)   [DISCONTINUED] sertraline (ZOLOFT) 100 MG tablet TAKE 1 TABLET BY MOUTH ONCE DAILY   [DISCONTINUED] traZODone (DESYREL) 50 MG tablet TAKE 1/2-1 TABLET (25-50 MG TOTAL) BY MOUTH AT BEDTIME AS NEEDED FOR SLEEP.   No facility-administered encounter medications on file as of 09/26/2023.    No results found for this or any previous visit (from the past 2160 hours).   Psychiatric Specialty Exam: Physical Exam  Review of Systems  Weight 141 lb (64 kg).There is no height or weight on file to calculate BMI.  General Appearance: Casual  Eye Contact:  Good  Speech:  Clear and Coherent  Volume:  Normal  Mood:  Euthymic  Affect:  Appropriate  Thought Process:  Goal Directed  Orientation:  Full (Time,  Place, and Person)  Thought Content:  WDL  Suicidal Thoughts:  No  Homicidal Thoughts:  No  Memory:  Immediate;   Good Recent;   Good Remote;   Good  Judgement:  Good  Insight:  Good  Psychomotor Activity:  Normal  Concentration:  Concentration: Good and Attention Span: Good  Recall:  Good  Fund of Knowledge:  Good  Language:  Good  Akathisia:  No  Handed:  Right  AIMS (if indicated):     Assets:  Communication Skills Desire for Improvement Housing Resilience Social  Support Talents/Skills Transportation  ADL's:  Intact  Cognition:  WNL  Sleep: Good     Assessment/Plan: MDD (major depressive disorder), recurrent episode, moderate (HCC) - Plan: buPROPion (WELLBUTRIN XL) 300 MG 24 hr tablet, lamoTRIgine (LAMICTAL) 150 MG tablet, risperiDONE (RISPERDAL) 0.5 MG tablet  Anxiety - Plan: buPROPion (WELLBUTRIN XL) 300 MG 24 hr tablet, lamoTRIgine (LAMICTAL) 150 MG tablet, risperiDONE (RISPERDAL) 0.5 MG tablet, hydrOXYzine (ATARAX) 10 MG tablet  Patient is stable on current medication.  She feel her mood is stable and her anxiety is under control.  Continue Wellbutrin XL 300 mg daily, Lamictal 150 mg daily, risperidone 0.5 mg at bedtime and hydroxyzine 25 mg at bedtime.  Recommended to call us back if she has any question or any concern.  Follow-up in 3 months   Follow Up Instructions:     I discussed the assessment and treatment plan with the patient. The patient was provided an opportunity to ask questions and all were answered. The patient agreed with the plan and demonstrated an understanding of the instructions.   The patient was advised to call back or seek an in-person evaluation if the symptoms worsen or if the condition fails to improve as anticipated.    Collaboration of Care: Other provider involved in patient's care AEB notes are available in epic to review  Patient/Guardian was advised Release of Information must be obtained prior to any record release in order to collaborate their care with an outside provider. Patient/Guardian was advised if they have not already done so to contact the registration department to sign all necessary forms in order for [place] to release information regarding their care.   Consent: Patient/Guardian gives verbal consent for treatment and assignment of benefits for services provided during this visit. Patient/Guardian expressed understanding and agreed to proceed.     I provided 18 minutes of non face to face time  during this encounter.  Note: This document was prepared by [employer] voice dictation technology and any errors that results from this process are unintentional.    Cleotis Nipper, MD 09/26/2023

## 2023-09-27 ENCOUNTER — Other Ambulatory Visit: Payer: Self-pay

## 2023-09-27 ENCOUNTER — Other Ambulatory Visit (HOSPITAL_BASED_OUTPATIENT_CLINIC_OR_DEPARTMENT_OTHER): Payer: Self-pay

## 2023-10-18 ENCOUNTER — Other Ambulatory Visit (HOSPITAL_BASED_OUTPATIENT_CLINIC_OR_DEPARTMENT_OTHER): Payer: Self-pay

## 2023-10-21 ENCOUNTER — Encounter: Payer: Self-pay | Admitting: Family Medicine

## 2023-10-21 ENCOUNTER — Other Ambulatory Visit (HOSPITAL_BASED_OUTPATIENT_CLINIC_OR_DEPARTMENT_OTHER): Payer: Self-pay

## 2023-11-14 ENCOUNTER — Other Ambulatory Visit (HOSPITAL_BASED_OUTPATIENT_CLINIC_OR_DEPARTMENT_OTHER): Payer: Self-pay

## 2023-11-15 ENCOUNTER — Other Ambulatory Visit (HOSPITAL_BASED_OUTPATIENT_CLINIC_OR_DEPARTMENT_OTHER): Payer: Self-pay

## 2023-11-22 ENCOUNTER — Ambulatory Visit
Admission: RE | Admit: 2023-11-22 | Discharge: 2023-11-22 | Disposition: A | Discharge status: Home / Self Care | Payer: 59 | Source: Ambulatory Visit | Attending: Family Medicine | Admitting: Family Medicine

## 2023-11-22 DIAGNOSIS — R921 Mammographic calcification found on diagnostic imaging of breast: Secondary | ICD-10-CM | POA: Diagnosis not present

## 2023-11-29 ENCOUNTER — Encounter (HOSPITAL_COMMUNITY): Payer: Self-pay | Admitting: *Deleted

## 2023-12-11 ENCOUNTER — Other Ambulatory Visit (HOSPITAL_COMMUNITY): Payer: Self-pay

## 2023-12-11 ENCOUNTER — Other Ambulatory Visit (HOSPITAL_BASED_OUTPATIENT_CLINIC_OR_DEPARTMENT_OTHER): Payer: Self-pay

## 2023-12-11 ENCOUNTER — Telehealth (HOSPITAL_COMMUNITY): Payer: Self-pay

## 2023-12-11 DIAGNOSIS — F419 Anxiety disorder, unspecified: Secondary | ICD-10-CM

## 2023-12-11 MED ORDER — LORAZEPAM 0.5 MG PO TABS
0.5000 mg | ORAL_TABLET | Freq: Every day | ORAL | 0 refills | Status: AC | PRN
  Filled 2023-12-11: qty 5, 5d supply, fill #0

## 2023-12-11 MED ORDER — HYDROXYZINE HCL 10 MG PO TABS
10.0000 mg | ORAL_TABLET | Freq: Every day | ORAL | 0 refills | Status: DC
Start: 2023-12-11 — End: 2024-01-22
  Filled 2023-12-11: qty 30, 30d supply, fill #0

## 2023-12-11 NOTE — Telephone Encounter (Signed)
 Please send 5 tab of Ativan 0.5 mg which she can take as needed for anxiety.  I have also provided hydroxyzine 10 mg #30 given on January 30.  We can refill the same medication if she need more than 5 tablets of Ativan.  Please call the patient.

## 2023-12-11 NOTE — Telephone Encounter (Signed)
 Called pharmacy and gave the prescription to the pharmacy. I also called the patient and LVM letting her know that her prescriptions are at the pharmacy

## 2023-12-11 NOTE — Telephone Encounter (Signed)
 Patient has a follow up on 4/30, she is calling because she says that her anxiety has increased over the littlest thing. She is not sure why but she wants to know if you can prescribe her Ativan again. Please review and advise, thank you

## 2023-12-19 ENCOUNTER — Telehealth (HOSPITAL_COMMUNITY): Payer: 59 | Admitting: Psychiatry

## 2023-12-25 ENCOUNTER — Telehealth (HOSPITAL_COMMUNITY): Admitting: Psychiatry

## 2023-12-27 ENCOUNTER — Other Ambulatory Visit: Payer: Self-pay | Admitting: Family Medicine

## 2023-12-27 ENCOUNTER — Other Ambulatory Visit (HOSPITAL_BASED_OUTPATIENT_CLINIC_OR_DEPARTMENT_OTHER): Payer: Self-pay

## 2023-12-27 MED ORDER — MOUNJARO 5 MG/0.5ML ~~LOC~~ SOAJ
5.0000 mg | SUBCUTANEOUS | 2 refills | Status: DC
  Filled 2023-12-27: qty 6, 84d supply, fill #0
  Filled 2024-03-15: qty 6, 84d supply, fill #1

## 2024-01-22 ENCOUNTER — Other Ambulatory Visit (HOSPITAL_BASED_OUTPATIENT_CLINIC_OR_DEPARTMENT_OTHER): Payer: Self-pay

## 2024-01-22 ENCOUNTER — Other Ambulatory Visit (HOSPITAL_COMMUNITY): Payer: Self-pay | Admitting: Psychiatry

## 2024-01-22 ENCOUNTER — Other Ambulatory Visit (HOSPITAL_COMMUNITY): Payer: Self-pay

## 2024-01-22 DIAGNOSIS — F419 Anxiety disorder, unspecified: Secondary | ICD-10-CM

## 2024-01-22 DIAGNOSIS — F331 Major depressive disorder, recurrent, moderate: Secondary | ICD-10-CM

## 2024-01-22 MED ORDER — BUPROPION HCL ER (XL) 300 MG PO TB24
300.0000 mg | ORAL_TABLET | Freq: Every day | ORAL | 0 refills | Status: DC
Start: 2024-01-22 — End: 2024-02-24
  Filled 2024-01-22: qty 30, 30d supply, fill #0

## 2024-01-22 MED ORDER — RISPERIDONE 0.5 MG PO TABS
0.5000 mg | ORAL_TABLET | Freq: Every day | ORAL | 0 refills | Status: DC
  Filled 2024-01-22: qty 30, 30d supply, fill #0

## 2024-01-22 MED ORDER — HYDROXYZINE HCL 10 MG PO TABS
10.0000 mg | ORAL_TABLET | Freq: Every day | ORAL | 0 refills | Status: DC
  Filled 2024-01-22: qty 30, 30d supply, fill #0

## 2024-01-22 MED ORDER — LAMOTRIGINE 150 MG PO TABS
150.0000 mg | ORAL_TABLET | Freq: Every morning | ORAL | 0 refills | Status: DC
Start: 2024-01-22 — End: 2024-02-24
  Filled 2024-01-22: qty 30, 30d supply, fill #0

## 2024-02-24 ENCOUNTER — Other Ambulatory Visit: Payer: Self-pay

## 2024-02-24 ENCOUNTER — Other Ambulatory Visit (HOSPITAL_BASED_OUTPATIENT_CLINIC_OR_DEPARTMENT_OTHER): Payer: Self-pay

## 2024-02-24 ENCOUNTER — Encounter (HOSPITAL_COMMUNITY): Payer: Self-pay | Admitting: Psychiatry

## 2024-02-24 ENCOUNTER — Telehealth (HOSPITAL_BASED_OUTPATIENT_CLINIC_OR_DEPARTMENT_OTHER): Admitting: Psychiatry

## 2024-02-24 DIAGNOSIS — F331 Major depressive disorder, recurrent, moderate: Secondary | ICD-10-CM | POA: Diagnosis not present

## 2024-02-24 DIAGNOSIS — F419 Anxiety disorder, unspecified: Secondary | ICD-10-CM | POA: Diagnosis not present

## 2024-02-24 MED ORDER — LAMOTRIGINE 150 MG PO TABS
150.0000 mg | ORAL_TABLET | Freq: Every morning | ORAL | 0 refills | Status: DC
  Filled 2024-02-24: qty 90, 90d supply, fill #0

## 2024-02-24 MED ORDER — BUPROPION HCL ER (XL) 300 MG PO TB24
300.0000 mg | ORAL_TABLET | Freq: Every day | ORAL | 0 refills | Status: DC
  Filled 2024-02-24: qty 90, 90d supply, fill #0

## 2024-02-24 MED ORDER — RISPERIDONE 0.5 MG PO TABS
0.5000 mg | ORAL_TABLET | Freq: Every day | ORAL | 0 refills | Status: DC
  Filled 2024-02-24: qty 90, 90d supply, fill #0

## 2024-02-24 MED ORDER — HYDROXYZINE HCL 10 MG PO TABS
10.0000 mg | ORAL_TABLET | Freq: Two times a day (BID) | ORAL | 2 refills | Status: DC | PRN
  Filled 2024-02-24: qty 45, 23d supply, fill #0
  Filled 2024-04-17: qty 45, 23d supply, fill #1

## 2024-02-24 NOTE — Progress Notes (Signed)
 [facility] MD Virtual Progress Note   Patient Location: In Car Provider Location: Home Office  I connect with patient by video and verified that I am speaking with correct person by using two identifiers. I discussed the limitations of evaluation and management by telemedicine and the availability of in person appointments. I also discussed with the patient that there may be a patient responsible charge related to this service. The patient expressed understanding and agreed to proceed.  Martha Shannon [id number] 43 y.o.  02/24/2024 3:32 PM  History of Present Illness:  Patient is evaluated by video session.  She reported depression is stable but sometimes she is stressed about her work.  She ask few months ago Ativan  and we provided 5 tablet.  She reported it does work and she take it.  She sleeps good with the help of hydroxyzine  that she takes every night.  She denies any major panic attack in recent weeks.  She is on risperidone , Lamictal , Wellbutrin .  She has no rash, itching tremors or shakes.  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  She excited about upcoming beach trip next week with the family.  Her daughter who is in college is now working part-time at [employer].  Patient lives with her husband.  She wants to continue current medication.  She denies any feeling of hopelessness or worthlessness.  She denies any suicidal thoughts.  She admitted weight gain few pounds since the last visit but hoping to watch her calorie intake will help.  She is also on Mounjaro  from primary care to help her blood sugar.  Past Psychiatric History: H/O depression anxiety since age 56.  No h/o inpatient treatment, suicidal attempt, psychosis, abuse.  PCP tried Lexapro that stop working after 1 year, Abilify  make mood worst. Buspar , Seroquel , Zoloft , Klonopin , Xanax , gabapentin  and Strattera  did not work.    Outpatient Encounter  Medications as of 02/24/2024  Medication Sig   albuterol  (VENTOLIN  HFA) 108 (90 Base) MCG/ACT inhaler Inhale 2 puffs into the lungs every 4 (four) hours as needed for wheezing or shortness of breath.   buPROPion  (WELLBUTRIN  XL) 300 MG 24 hr tablet Take 1 tablet (300 mg total) by mouth daily.   clobetasol  (TEMOVATE ) 0.05 % external solution Apply 1 Application topically 2 (two) times daily.   hydrOXYzine  (ATARAX ) 10 MG tablet Take 1 tablet (10 mg total) by mouth at bedtime.   lamoTRIgine  (LAMICTAL ) 150 MG tablet Take 1 tablet (150 mg total) by mouth every morning.   LORazepam  (ATIVAN ) 0.5 MG tablet Take 1 tablet (0.5 mg total) by mouth daily as needed for panic attacks.   risperiDONE  (RISPERDAL ) 0.5 MG tablet Take 1 tablet (0.5 mg total) by mouth at bedtime.   rosuvastatin  (CRESTOR ) 20 MG tablet Take 1 tablet (20 mg total) by mouth daily.   tirzepatide  (MOUNJARO ) 5 MG/0.5ML Pen Inject 5 mg into the skin once a week.   [DISCONTINUED] phentermine  (ADIPEX-P ) 37.5 MG tablet Take 1 tablet (37.5 mg total) by mouth daily before breakfast.   [DISCONTINUED] QUEtiapine  (SEROQUEL ) 50 MG tablet Take 1 tablet (50 mg total) by mouth at bedtime. (Patient not taking: Reported on 05/09/2020)   [DISCONTINUED] sertraline  (ZOLOFT ) 100 MG tablet TAKE 1 TABLET BY MOUTH ONCE DAILY   [DISCONTINUED] traZODone  (DESYREL ) 50 MG tablet TAKE 1/2-1 TABLET (25-50 MG TOTAL) BY MOUTH AT BEDTIME AS NEEDED FOR SLEEP.   No facility-administered encounter medications on file as of 02/24/2024.    No  results found for this or any previous visit (from the past 2160 hours).   Psychiatric Specialty Exam: Physical Exam  Review of Systems  Weight 146 lb (66.2 kg).There is no height or weight on file to calculate BMI.  General Appearance: Casual  Eye Contact:  Good  Speech:  Clear and Coherent and Normal Rate  Volume:  Normal  Mood:  Euthymic  Affect:  Congruent  Thought Process:  Goal Directed  Orientation:  Full (Time, Place, and  Person)  Thought Content:  Logical  Suicidal Thoughts:  No  Homicidal Thoughts:  No  Memory:  Immediate;   Good Recent;   Good Remote;   Good  Judgement:  Good  Insight:  Good  Psychomotor Activity:  Normal  Concentration:  Concentration: Good and Attention Span: Good  Recall:  Good  Fund of Knowledge:  Good  Language:  Good  Akathisia:  No  Handed:  Right  AIMS (if indicated):     Assets:  Communication Skills Desire for Improvement Financial Resources/Insurance Housing Talents/Skills Transportation  ADL's:  Intact  Cognition:  WNL  Sleep:  ok       03/01/2023    3:36 PM 10/15/2022    9:43 AM 03/15/2022    3:50 PM 03/07/2021    8:03 AM 10/12/2020    8:08 AM  Depression screen PHQ 2/9  Decreased Interest 0 0 0 1 3  Down, Depressed, Hopeless 0 0 0 1 3  PHQ - 2 Score 0 0 0 2 6  Altered sleeping 0 2  0 3  Tired, decreased energy 0 0  0 1  Change in appetite 0 0  2 3  Feeling bad or failure about yourself  0 0  0 1  Trouble concentrating 0 0  3 3  Moving slowly or fidgety/restless 0 0  0 3  Suicidal thoughts 0 0  0   PHQ-9 Score 0 2  7 20   Difficult doing work/chores Not difficult at all Not difficult at all  Somewhat difficult Very difficult    Assessment/Plan: Anxiety - Plan: buPROPion  (WELLBUTRIN  XL) 300 MG 24 hr tablet, hydrOXYzine  (ATARAX ) 10 MG tablet, lamoTRIgine  (LAMICTAL ) 150 MG tablet, risperiDONE  (RISPERDAL ) 0.5 MG tablet  MDD (major depressive disorder), recurrent episode, moderate (HCC) - Plan: buPROPion  (WELLBUTRIN  XL) 300 MG 24 hr tablet, lamoTRIgine  (LAMICTAL ) 150 MG tablet, risperiDONE  (RISPERDAL ) 0.5 MG tablet  Patient is stable on current medication.  Occasional anxiety and nervousness.  Recommend rather than taking Ativan  she can try taking extra hydroxyzine  to help her anxiety.  Patient agreed with the plan we will provide 15 more tablets in a month that she can take the hydroxyzine  during the day if needed and she will continue 10 mg hydroxyzine  at  that time.  Continue risperidone  0.5 mg at bedtime, Wellbutrin  XL 300 mg daily and Lamictal  150 mg daily.  Recommended to call us  back if she has any question or any concern.  Follow-up in 3 months.   Follow Up Instructions:     I discussed the assessment and treatment plan with the patient. The patient was provided an opportunity to ask questions and all were answered. The patient agreed with the plan and demonstrated an understanding of the instructions.   The patient was advised to call back or seek an in-person evaluation if the symptoms worsen or if the condition fails to improve as anticipated.    Collaboration of Care: Other provider involved in patient's care AEB notes are available in epic to  review  Patient/Guardian was advised Release of Information must be obtained prior to any record release in order to collaborate their care with an outside provider. Patient/Guardian was advised if they have not already done so to contact the registration department to sign all necessary forms in order for us  to release information regarding their care.   Consent: Patient/Guardian gives verbal consent for treatment and assignment of benefits for services provided during this visit. Patient/Guardian expressed understanding and agreed to proceed.     Total encounter time 18 minutes which includes face-to-face time, chart reviewed, care coordination, order entry and documentation during this encounter.   Note: This document was prepared by [employer] voice dictation technology and any errors that results from this process are unintentional.    Leni ONEIDA Client, MD 02/24/2024

## 2024-03-17 ENCOUNTER — Other Ambulatory Visit (HOSPITAL_BASED_OUTPATIENT_CLINIC_OR_DEPARTMENT_OTHER): Payer: Self-pay

## 2024-04-08 ENCOUNTER — Encounter: Admitting: Obstetrics and Gynecology

## 2024-04-17 ENCOUNTER — Other Ambulatory Visit (HOSPITAL_BASED_OUTPATIENT_CLINIC_OR_DEPARTMENT_OTHER): Payer: Self-pay

## 2024-04-17 ENCOUNTER — Other Ambulatory Visit: Payer: Self-pay

## 2024-04-17 ENCOUNTER — Other Ambulatory Visit: Payer: Self-pay | Admitting: Family Medicine

## 2024-04-17 DIAGNOSIS — J029 Acute pharyngitis, unspecified: Secondary | ICD-10-CM

## 2024-04-17 MED ORDER — ALBUTEROL SULFATE HFA 108 (90 BASE) MCG/ACT IN AERS
2.0000 | INHALATION_SPRAY | RESPIRATORY_TRACT | 0 refills | Status: AC | PRN
Start: 2024-04-17 — End: ?
  Filled 2024-04-17: qty 6.7, 30d supply, fill #0

## 2024-04-27 NOTE — Progress Notes (Unsigned)
 History:  [name] is a 43 y.o. G1P1 who presents to clinic today for annual exam LMP 03/30/2024 it lasted 7 days, usually every 28 days. Reports they are normal - no heavy flow and pain managable.  Reports she is still sexually active, not contracepting. Has not been contracepting for last 19 years and that was last pregnancy.    The following portions of the patient's history were reviewed and updated as appropriate: allergies, current medications, family history, past medical history, social history, past surgical history and problem list.  Review of Systems:  Review of Systems  Constitutional:  Negative for chills, diaphoresis, fever, malaise/fatigue and weight loss.  Eyes:  Positive for pain.  Respiratory:  Negative for cough, shortness of breath and wheezing.   Genitourinary:  Positive for frequency and urgency. Negative for dysuria, flank pain and hematuria.  Psychiatric/Behavioral:  Negative for depression, substance abuse and suicidal ideas. The patient is not nervous/anxious.       Objective:  Physical Exam There were no vitals taken for this visit. Physical Exam   Labs and Imaging No results found for this or any previous visit (from the past 24 hours).  No results found.   Assessment & Plan:  1. Well woman exam (Primary) ***  2. Cervical cancer screening ***   Martha Shannon LABOR, CNM 04/27/2024 5:32 PM

## 2024-04-28 ENCOUNTER — Ambulatory Visit (INDEPENDENT_AMBULATORY_CARE_PROVIDER_SITE_OTHER): Admitting: Certified Nurse Midwife

## 2024-04-28 ENCOUNTER — Other Ambulatory Visit (HOSPITAL_COMMUNITY)
Admission: RE | Admit: 2024-04-28 | Discharge: 2024-04-28 | Disposition: A | Discharge status: Home / Self Care | Source: Ambulatory Visit | Attending: Certified Nurse Midwife | Admitting: Certified Nurse Midwife

## 2024-04-28 VITALS — BP 119/65 | HR 74 | Ht 61.0 in | Wt 145.0 lb

## 2024-04-28 DIAGNOSIS — Z124 Encounter for screening for malignant neoplasm of cervix: Secondary | ICD-10-CM | POA: Insufficient documentation

## 2024-04-28 DIAGNOSIS — Z01419 Encounter for gynecological examination (general) (routine) without abnormal findings: Secondary | ICD-10-CM | POA: Diagnosis not present

## 2024-04-28 DIAGNOSIS — R3915 Urgency of urination: Secondary | ICD-10-CM

## 2024-04-28 LAB — POCT URINALYSIS DIPSTICK
Bilirubin, UA: NEGATIVE
Blood, UA: NEGATIVE
Glucose, UA: NEGATIVE
Nitrite, UA: NEGATIVE
Protein, UA: POSITIVE — AB
Spec Grav, UA: 1.015 (ref 1.010–1.025)
Urobilinogen, UA: NEGATIVE U/dL — AB
pH, UA: 6 (ref 5.0–8.0)

## 2024-04-30 LAB — CYTOLOGY - PAP
Comment: NEGATIVE
Diagnosis: NEGATIVE
High risk HPV: NEGATIVE

## 2024-05-01 ENCOUNTER — Ambulatory Visit: Payer: Self-pay | Admitting: Certified Nurse Midwife

## 2024-05-12 ENCOUNTER — Encounter: Payer: Self-pay | Admitting: Family Medicine

## 2024-05-12 ENCOUNTER — Other Ambulatory Visit (HOSPITAL_BASED_OUTPATIENT_CLINIC_OR_DEPARTMENT_OTHER): Payer: Self-pay

## 2024-05-12 ENCOUNTER — Ambulatory Visit (INDEPENDENT_AMBULATORY_CARE_PROVIDER_SITE_OTHER): Admitting: Family Medicine

## 2024-05-12 VITALS — BP 126/70 | HR 73 | Temp 98.0°F | Resp 16 | Ht 61.0 in | Wt 145.8 lb

## 2024-05-12 DIAGNOSIS — Z7985 Long-term (current) use of injectable non-insulin antidiabetic drugs: Secondary | ICD-10-CM

## 2024-05-12 DIAGNOSIS — Z0000 Encounter for general adult medical examination without abnormal findings: Secondary | ICD-10-CM | POA: Diagnosis not present

## 2024-05-12 DIAGNOSIS — E1165 Type 2 diabetes mellitus with hyperglycemia: Secondary | ICD-10-CM | POA: Diagnosis not present

## 2024-05-12 MED ORDER — KETOCONAZOLE 2 % EX SHAM
1.0000 | MEDICATED_SHAMPOO | CUTANEOUS | 0 refills | Status: AC
  Filled 2024-05-12: qty 120, 84d supply, fill #0

## 2024-05-12 MED ORDER — TIRZEPATIDE 7.5 MG/0.5ML ~~LOC~~ SOAJ
7.5000 mg | SUBCUTANEOUS | 4 refills | Status: AC
  Filled 2024-05-12 – 2024-06-15 (×3): qty 2, 28d supply, fill #0
  Filled 2024-07-19: qty 2, 28d supply, fill #1
  Filled 2024-08-13: qty 2, 28d supply, fill #2
  Filled 2024-09-11: qty 2, 28d supply, fill #3

## 2024-05-12 NOTE — Patient Instructions (Signed)
Give [place] 2-3 business days to get the results of your labs back.   Keep the diet clean and stay active.  Aim to do some physical exertion for 150 minutes per week. This is typically divided into 5 days per week, 30 minutes per day. The activity should be enough to get your heart rate up. Anything is better than nothing if you have time constraints.  Please consider adding some weight resistance exercise to your routine. Consider yoga as well.  Please get me a copy of your advanced directive form at your convenience.    Let us know if you need anything.

## 2024-05-12 NOTE — Progress Notes (Signed)
 Chief Complaint  Patient presents with   Annual Exam    CPE     Well Woman Martha Shannon is here for a complete physical.   Her last physical was >1 year ago.  Current diet: in general, a healthy diet. Current exercise: walking. Weight is increased a bit and she denies fatigue out of ordinary. Patient's last menstrual period was 03/30/2024 (exact date). Seatbelt? Yes Advanced directive? No  Health Maintenance Pap/HPV- Yes Mammogram- Yes Tetanus- Yes Hep C screening- Yes HIV screening- Yes  Past Medical History:  Diagnosis Date   Anxiety and depression 09/24/2016   Asthma     controlled    Chicken pox    As a child.   Depression    Diabetes mellitus type 2 in obese 09/24/2016   Fatty liver    Kidney stones    Migraines    PONV (postoperative nausea and vomiting)    vomiting with epidural during childbirth      Past Surgical History:  Procedure Laterality Date   CESAREAN SECTION     2006.   LAPAROSCOPIC GASTRIC SLEEVE RESECTION N/A 04/28/2019   Procedure: LAPAROSCOPIC GASTRIC SLEEVE RESECTION, Upper Endo, ERAS Pathway;  Surgeon: Kinsinger, Herlene Righter, MD;  Location: [facility];  Service: General;  Laterality: N/A;    Medications  Current Outpatient Medications on File Prior to Visit  Medication Sig Dispense Refill   albuterol  (VENTOLIN  HFA) 108 (90 Base) MCG/ACT inhaler Inhale 2 puffs into the lungs every 4 (four) hours as needed for wheezing or shortness of breath. Needs appt 6.7 g 0   buPROPion  (WELLBUTRIN  XL) 300 MG 24 hr tablet Take 1 tablet (300 mg total) by mouth daily. 90 tablet 0   clobetasol  (TEMOVATE ) 0.05 % external solution Apply 1 Application topically 2 (two) times daily. 50 mL 0   hydrOXYzine  (ATARAX ) 10 MG tablet Take 1 tablet (10 mg total) by mouth 2 (two) times daily as needed for anxiety. 45 tablet 2   lamoTRIgine  (LAMICTAL ) 150 MG tablet Take 1 tablet (150 mg total) by mouth every morning. 90 tablet 0   LORazepam  (ATIVAN ) 0.5 MG tablet Take 1  tablet (0.5 mg total) by mouth daily as needed for panic attacks. (Patient not taking: Reported on 02/24/2024) 5 tablet 0   risperiDONE  (RISPERDAL ) 0.5 MG tablet Take 1 tablet (0.5 mg total) by mouth at bedtime. 90 tablet 0   rosuvastatin  (CRESTOR ) 20 MG tablet Take 1 tablet (20 mg total) by mouth daily. 30 tablet 3   [DISCONTINUED] phentermine  (ADIPEX-P ) 37.5 MG tablet Take 1 tablet (37.5 mg total) by mouth daily before breakfast. 30 tablet 0   [DISCONTINUED] QUEtiapine  (SEROQUEL ) 50 MG tablet Take 1 tablet (50 mg total) by mouth at bedtime. (Patient not taking: Reported on 05/09/2020) 30 tablet 2   [DISCONTINUED] sertraline  (ZOLOFT ) 100 MG tablet TAKE 1 TABLET BY MOUTH ONCE DAILY 90 tablet 2   [DISCONTINUED] traZODone  (DESYREL ) 50 MG tablet TAKE 1/2-1 TABLET (25-50 MG TOTAL) BY MOUTH AT BEDTIME AS NEEDED FOR SLEEP. 30 tablet 3    Allergies Allergies  Allergen Reactions   Naproxen      Legs felt agitated, and restleness     Review of Systems: Constitutional:  no unexpected weight changes Eye:  no recent significant change in vision Ear/Nose/Mouth/Throat:  Ears:  no recent change in hearing Nose/Mouth/Throat:  no complaints of nasal congestion, no sore throat Cardiovascular: no chest pain Respiratory:  no shortness of breath Gastrointestinal:  no abdominal pain, no change in bowel habits GU:  Female: negative for dysuria or pelvic pain Musculoskeletal/Extremities:  no pain of the joints Integumentary (Skin/Breast):  no abnormal skin lesions reported Neurologic:  no headaches Endocrine:  denies fatigue Hematologic/Lymphatic:  No areas of easy bleeding  Exam BP 126/70 (BP Location: Left Arm, Patient Position: Sitting)   Pulse 73   Temp 98 F (36.7 C) (Oral)   Resp 16   Ht 5' 1 (1.549 m)   Wt 145 lb 12.8 oz (66.1 kg)   LMP 03/30/2024 (Exact Date)   SpO2 100%   BMI 27.55 kg/m  General:  well developed, well nourished, in no apparent distress Skin:  no significant moles, warts,  or growths Head:  no masses, lesions, or tenderness Eyes:  pupils equal and round, sclera anicteric without injection Ears:  canals without lesions, TMs shiny without retraction, no obvious effusion, no erythema Nose:  nares patent, mucosa normal, and no drainage Throat/Pharynx:  lips and gingiva without lesion; tongue and uvula midline; non-inflamed pharynx; no exudates or postnasal drainage Neck: neck supple without adenopathy, thyromegaly, or masses Lungs:  clear to auscultation, breath sounds equal bilaterally, no respiratory distress Cardio:  regular rate and rhythm, no LE edema Abdomen:  abdomen soft, nontender; bowel sounds normal; no masses or organomegaly Genital: Defer to GYN Musculoskeletal:  symmetrical muscle groups noted without atrophy or deformity Extremities:  no clubbing, cyanosis, or edema, no deformities, no skin discoloration Neuro:  gait normal; deep tendon reflexes normal and symmetric Psych: well oriented with normal range of affect and appropriate judgment/insight  Assessment and Plan  Well adult exam - Plan: CBC, Comprehensive metabolic panel with GFR, Lipid panel, Hepatitis B surface antibody,quantitative  Type 2 diabetes mellitus with hyperglycemia, without long-term current use of insulin (HCC) - Plan: Hemoglobin A1c, Microalbumin / creatinine urine ratio, tirzepatide  (MOUNJARO ) 7.5 MG/0.5ML Pen   Well 43 y.o. female. Counseled on diet and exercise. Advanced directive form provided today.  Hep B screening as above. She gets her flu shot next month through work. Other orders as above. Follow up in 6 mo. The patient voiced understanding and agreement to the plan.  [name], DO 05/12/24 4:15 PM

## 2024-05-13 ENCOUNTER — Ambulatory Visit: Payer: Self-pay | Admitting: Family Medicine

## 2024-05-13 LAB — HEMOGLOBIN A1C: Hgb A1c MFr Bld: 5.6 % (ref 4.6–6.5)

## 2024-05-13 LAB — COMPREHENSIVE METABOLIC PANEL WITH GFR
ALT: 14 U/L (ref 0–35)
AST: 17 U/L (ref 0–37)
Albumin: 4.4 g/dL (ref 3.5–5.2)
Alkaline Phosphatase: 46 U/L (ref 39–117)
BUN: 8 mg/dL (ref 6–23)
CO2: 28 meq/L (ref 19–32)
Calcium: 9.3 mg/dL (ref 8.4–10.5)
Chloride: 101 meq/L (ref 96–112)
Creatinine, Ser: 0.58 mg/dL (ref 0.40–1.20)
GFR: 110.31 mL/min (ref >60.00)
Glucose, Bld: 69 mg/dL — ABNORMAL LOW (ref 70–99)
Potassium: 4.1 meq/L (ref 3.5–5.1)
Sodium: 136 meq/L (ref 135–145)
Total Bilirubin: 0.4 mg/dL (ref 0.2–1.2)
Total Protein: 6.7 g/dL (ref 6.0–8.3)

## 2024-05-13 LAB — LIPID PANEL
Cholesterol: 186 mg/dL (ref 0–200)
HDL: 66.9 mg/dL (ref >39.00)
LDL Cholesterol: 111 mg/dL — ABNORMAL HIGH (ref 0–99)
NonHDL: 118.73
Total CHOL/HDL Ratio: 3
Triglycerides: 38 mg/dL (ref 0.0–149.0)
VLDL: 7.6 mg/dL (ref 0.0–40.0)

## 2024-05-13 LAB — MICROALBUMIN / CREATININE URINE RATIO
Creatinine,U: 135.7 mg/dL
Microalb Creat Ratio: 8.8 mg/g (ref 0.0–30.0)
Microalb, Ur: 1.2 mg/dL (ref 0.0–1.9)

## 2024-05-13 LAB — HEPATITIS B SURFACE ANTIBODY, QUANTITATIVE: Hep B S AB Quant (Post): 9 m[IU]/mL — ABNORMAL LOW (ref >=10)

## 2024-05-13 LAB — CBC
HCT: 31.7 % — ABNORMAL LOW (ref 36.0–46.0)
Hemoglobin: 10.1 g/dL — ABNORMAL LOW (ref 12.0–15.0)
MCHC: 31.7 g/dL (ref 30.0–36.0)
MCV: 68.7 fl — ABNORMAL LOW (ref 78.0–100.0)
Platelets: 411 K/uL — ABNORMAL HIGH (ref 150.0–400.0)
RBC: 4.61 Mil/uL (ref 3.87–5.11)
RDW: 19 % — ABNORMAL HIGH (ref 11.5–15.5)
WBC: 7.9 K/uL (ref 4.0–10.5)

## 2024-05-25 ENCOUNTER — Other Ambulatory Visit (HOSPITAL_BASED_OUTPATIENT_CLINIC_OR_DEPARTMENT_OTHER): Payer: Self-pay

## 2024-05-25 ENCOUNTER — Encounter (HOSPITAL_COMMUNITY): Payer: Self-pay | Admitting: Psychiatry

## 2024-05-25 ENCOUNTER — Telehealth (HOSPITAL_COMMUNITY): Admitting: Psychiatry

## 2024-05-25 VITALS — Wt 147.0 lb

## 2024-05-25 DIAGNOSIS — F331 Major depressive disorder, recurrent, moderate: Secondary | ICD-10-CM | POA: Diagnosis not present

## 2024-05-25 DIAGNOSIS — F419 Anxiety disorder, unspecified: Secondary | ICD-10-CM

## 2024-05-25 MED ORDER — HYDROXYZINE HCL 10 MG PO TABS
10.0000 mg | ORAL_TABLET | Freq: Two times a day (BID) | ORAL | 2 refills | Status: AC | PRN
  Filled 2024-05-25: qty 45, 23d supply, fill #0

## 2024-05-25 MED ORDER — LAMOTRIGINE 150 MG PO TABS
150.0000 mg | ORAL_TABLET | Freq: Every morning | ORAL | 0 refills | Status: DC
  Filled 2024-05-25: qty 90, 90d supply, fill #0

## 2024-05-25 MED ORDER — RISPERIDONE 0.5 MG PO TABS
0.5000 mg | ORAL_TABLET | Freq: Every day | ORAL | 0 refills | Status: DC
  Filled 2024-05-25: qty 90, 90d supply, fill #0

## 2024-05-25 MED ORDER — BUPROPION HCL ER (XL) 300 MG PO TB24
300.0000 mg | ORAL_TABLET | Freq: Every day | ORAL | 0 refills | Status: DC
  Filled 2024-05-25: qty 90, 90d supply, fill #0

## 2024-05-25 NOTE — Progress Notes (Signed)
 [facility] MD Virtual Progress Note   Patient Location: In Car Provider Location: Office  I connect with patient by video and verified that I am speaking with correct person by using two identifiers. I discussed the limitations of evaluation and management by telemedicine and the availability of in person appointments. I also discussed with the patient that there may be a patient responsible charge related to this service. The patient expressed understanding and agreed to proceed.  Martha Shannon [id number] 43 y.o.  05/25/2024 3:47 PM  History of Present Illness:  Patient is evaluated by video session.  She reported some dysphoria because her daughter went back to school and she feel lonely and isolated.  She reported her husband is helpful and takes her to outside for movies but sometimes she feels not motivated to do things.  She denies any crying spells or any feeling of hopelessness or worthlessness.  She denies any major panic attack.  She feels the current medicine is working and she need to adjust herself with the reality that daughter is now in college.  She reported her job is busy but manageable.  Patient works at the radiology department.  She denies any suicidal thoughts or homicidal thoughts.  Energy level is okay.  She sleep good with the help of hydroxyzine .  She is on Mounjaro  prescribed by PCP.  Her appetite is okay.  Her weight is stable.  She recently had blood work and labs are stable.  She has anemia but stable.  Past Psychiatric History: H/O depression anxiety since age 12.  No h/o inpatient treatment, suicidal attempt, psychosis, abuse.  PCP tried Lexapro that stop working after 1 year, Abilify  make mood worst. Buspar , Seroquel , Zoloft , Klonopin , Xanax , gabapentin  and Strattera  did not work.   Past Medical History:  Diagnosis Date   Anxiety and depression 09/24/2016   Asthma     controlled    Chicken pox    As a child.   Depression    Diabetes  mellitus type 2 in obese 09/24/2016   Fatty liver    Kidney stones    Migraines    PONV (postoperative nausea and vomiting)    vomiting with epidural during childbirth     Outpatient Encounter Medications as of 05/25/2024  Medication Sig   albuterol  (VENTOLIN  HFA) 108 (90 Base) MCG/ACT inhaler Inhale 2 puffs into the lungs every 4 (four) hours as needed for wheezing or shortness of breath. Needs appt   buPROPion  (WELLBUTRIN  XL) 300 MG 24 hr tablet Take 1 tablet (300 mg total) by mouth daily.   clobetasol  (TEMOVATE ) 0.05 % external solution Apply 1 Application topically 2 (two) times daily.   hydrOXYzine  (ATARAX ) 10 MG tablet Take 1 tablet (10 mg total) by mouth 2 (two) times daily as needed for anxiety.   ketoconazole  (NIZORAL ) 2 % shampoo Apply 1 Application topically 2 (two) times a week.   lamoTRIgine  (LAMICTAL ) 150 MG tablet Take 1 tablet (150 mg total) by mouth every morning.   LORazepam  (ATIVAN ) 0.5 MG tablet Take 1 tablet (0.5 mg total) by mouth daily as needed for panic attacks. (Patient not taking: Reported on 02/24/2024)   risperiDONE  (RISPERDAL ) 0.5 MG tablet Take 1 tablet (0.5 mg total) by mouth at bedtime.   rosuvastatin  (CRESTOR ) 20 MG tablet Take 1 tablet (20 mg total) by mouth daily.   tirzepatide  (MOUNJARO ) 7.5 MG/0.5ML Pen Inject 7.5 mg into the skin once a week.   [DISCONTINUED] phentermine  (ADIPEX-P ) 37.5 MG tablet Take 1 tablet (37.5  mg total) by mouth daily before breakfast.   [DISCONTINUED] QUEtiapine  (SEROQUEL ) 50 MG tablet Take 1 tablet (50 mg total) by mouth at bedtime. (Patient not taking: Reported on 05/09/2020)   [DISCONTINUED] sertraline  (ZOLOFT ) 100 MG tablet TAKE 1 TABLET BY MOUTH ONCE DAILY   [DISCONTINUED] traZODone  (DESYREL ) 50 MG tablet TAKE 1/2-1 TABLET (25-50 MG TOTAL) BY MOUTH AT BEDTIME AS NEEDED FOR SLEEP.   No facility-administered encounter medications on file as of 05/25/2024.    Recent Results (from the past 2160 hours)  POCT Urinalysis Dipstick      Status: Abnormal   Collection Time: 04/28/24  3:54 PM  Result Value Ref Range   Color, UA     Clarity, UA     Glucose, UA Negative Negative   Bilirubin, UA Neg    Ketones, UA trace    Spec Grav, UA 1.015 1.010 - 1.025   Blood, UA Neg    pH, UA 6.0 5.0 - 8.0   Protein, UA Positive (A) Negative   Urobilinogen, UA negative (A) 0.2 or 1.0 E.U./dL   Nitrite, UA Neg    Leukocytes, UA Trace (A) Negative   Appearance     Odor    Cytology - PAP( [place])     Status: None   Collection Time: 04/28/24  4:14 PM  Result Value Ref Range   High risk HPV Negative    Adequacy      Satisfactory for evaluation; transformation zone component PRESENT.   Diagnosis      - Negative for intraepithelial lesion or malignancy (NILM)   Comment Normal Reference Range HPV - Negative   CBC     Status: Abnormal   Collection Time: 05/12/24  4:24 PM  Result Value Ref Range   WBC 7.9 4.0 - 10.5 K/uL   RBC 4.61 3.87 - 5.11 Mil/uL   Platelets 411.0 (H) 150.0 - 400.0 K/uL   Hemoglobin 10.1 (L) 12.0 - 15.0 g/dL   HCT 68.2 (L) 63.9 - 53.9 %   MCV 68.7 Repeated and verified X2. (L) 78.0 - 100.0 fl   MCHC 31.7 30.0 - 36.0 g/dL   RDW 80.9 (H) 88.4 - 84.4 %  Comprehensive metabolic panel with GFR     Status: Abnormal   Collection Time: 05/12/24  4:24 PM  Result Value Ref Range   Sodium 136 135 - 145 mEq/L   Potassium 4.1 3.5 - 5.1 mEq/L   Chloride 101 96 - 112 mEq/L   CO2 28 19 - 32 mEq/L   Glucose, Bld 69 (L) 70 - 99 mg/dL   BUN 8 6 - 23 mg/dL   Creatinine, Ser 9.41 0.40 - 1.20 mg/dL   Total Bilirubin 0.4 0.2 - 1.2 mg/dL   Alkaline Phosphatase 46 39 - 117 U/L   AST 17 0 - 37 U/L   ALT 14 0 - 35 U/L   Total Protein 6.7 6.0 - 8.3 g/dL   Albumin 4.4 3.5 - 5.2 g/dL   GFR 889.68 >39.99 mL/min    Comment: Calculated using the CKD-EPI Creatinine Equation (2021)   Calcium  9.3 8.4 - 10.5 mg/dL  Lipid panel     Status: Abnormal   Collection Time: 05/12/24  4:24 PM  Result Value Ref Range   Cholesterol 186  0 - 200 mg/dL    Comment: ATP III Classification       Desirable:  < 200 mg/dL               Borderline High:  200 -  239 mg/dL          High:  > = 759 mg/dL   Triglycerides 61.9 0.0 - 149.0 mg/dL    Comment: Normal:  <849 mg/dLBorderline High:  150 - 199 mg/dL   HDL 33.09 >60.99 mg/dL   VLDL 7.6 0.0 - 59.9 mg/dL   LDL Cholesterol 888 (H) 0 - 99 mg/dL   Total CHOL/HDL Ratio 3     Comment:                Men          Women1/2 Average Risk     3.4          3.3Average Risk          5.0          4.42X Average Risk          9.6          7.13X Average Risk          15.0          11.0                       NonHDL 118.73     Comment: NOTE:  Non-HDL goal should be 30 mg/dL higher than patient's LDL goal (i.e. LDL goal of < 70 mg/dL, would have non-HDL goal of < 100 mg/dL)  Hemoglobin J8r     Status: None   Collection Time: 05/12/24  4:24 PM  Result Value Ref Range   Hgb A1c MFr Bld 5.6 4.6 - 6.5 %    Comment: Glycemic Control Guidelines for People with Diabetes:Non Diabetic:  <6%Goal of Therapy: <7%Additional Action Suggested:  >8%   Microalbumin / creatinine urine ratio     Status: None   Collection Time: 05/12/24  4:24 PM  Result Value Ref Range   Microalb, Ur 1.2 0.0 - 1.9 mg/dL   Creatinine,U 864.2 mg/dL   Microalb Creat Ratio 8.8 0.0 - 30.0 mg/g  Hepatitis B surface antibody,quantitative     Status: Abnormal   Collection Time: 05/12/24  4:24 PM  Result Value Ref Range   Hep B S AB Quant (Post) 9 (L) > OR = 10 mIU/mL    Comment: . Patient does not have immunity to hepatitis B virus. . For additional information, please refer to http://education.questdiagnostics.com/faq/FAQ105 (This link is being provided for informational/ educational purposes only).      Psychiatric Specialty Exam: Physical Exam  Review of Systems  Weight 147 lb (66.7 kg), last menstrual period 03/30/2024.There is no height or weight on file to calculate BMI.  General Appearance: Casual  Eye Contact:  Good   Speech:  Clear and Coherent  Volume:  Normal  Mood:  Dysphoric  Affect:  Congruent  Thought Process:  Goal Directed  Orientation:  Full (Time, Place, and Person)  Thought Content:  Rumination  Suicidal Thoughts:  No  Homicidal Thoughts:  No  Memory:  Immediate;   Good Recent;   Good Remote;   Good  Judgement:  Good  Insight:  Good  Psychomotor Activity:  Normal  Concentration:  Concentration: Good and Attention Span: Good  Recall:  Good  Fund of Knowledge:  Good  Language:  Good  Akathisia:  No  Handed:  Right  AIMS (if indicated):     Assets:  Communication Skills Desire for Improvement Housing Social Support Talents/Skills Transportation  ADL's:  Intact  Cognition:  WNL  Sleep:  ok  05/12/2024    3:57 PM 05/12/2024    3:56 PM 03/01/2023    3:36 PM 10/15/2022    9:43 AM 03/15/2022    3:50 PM  Depression screen PHQ 2/9  Decreased Interest 3 0 0 0 0  Down, Depressed, Hopeless 1 0 0 0 0  PHQ - 2 Score 4 0 0 0 0  Altered sleeping 3 0 0 2   Tired, decreased energy 3 0 0 0   Change in appetite 1 0 0 0   Feeling bad or failure about yourself  0 0 0 0   Trouble concentrating 1 0 0 0   Moving slowly or fidgety/restless 0 0 0 0   Suicidal thoughts 0 0 0 0   PHQ-9 Score 12 0 0 2   Difficult doing work/chores Not difficult at all Not difficult at all Not difficult at all Not difficult at all     Assessment/Plan: MDD (major depressive disorder), recurrent episode, moderate (HCC) - Plan: buPROPion  (WELLBUTRIN  XL) 300 MG 24 hr tablet, lamoTRIgine  (LAMICTAL ) 150 MG tablet, risperiDONE  (RISPERDAL ) 0.5 MG tablet  Anxiety - Plan: buPROPion  (WELLBUTRIN  XL) 300 MG 24 hr tablet, hydrOXYzine  (ATARAX ) 10 MG tablet, lamoTRIgine  (LAMICTAL ) 150 MG tablet, risperiDONE  (RISPERDAL ) 0.5 MG tablet  Discussed boredom and isolation since daughter moved to college.  Encouraged to keep herself busy and motivated to do things.  She is taking the medication which is helping her overall mood  symptoms.  She does not want to change the medication but promised to use resources and keep herself busy.  She had a good support from her husband.  Continue risperidone  0.5 mg at bedtime, Wellbutrin  XL 300 mg in the morning, Lamictal  150 mg daily and hydroxyzine  10 mg as needed for anxiety.  Recommend to call back if she is any question or any concern.  I reviewed blood work results.  Patient has anemia and hemoglobin is 10.1 and hematocrit 31.7.  Her hemoglobin A1c is 5.6.  She is on Mounjaro .  Discussed medication side effects and benefits.  She has no rash, itching tremors or shakes.  Recommend to call back if she is any question or any concern.  Follow-up in 3 months.   Follow Up Instructions:     I discussed the assessment and treatment plan with the patient. The patient was provided an opportunity to ask questions and all were answered. The patient agreed with the plan and demonstrated an understanding of the instructions.   The patient was advised to call back or seek an in-person evaluation if the symptoms worsen or if the condition fails to improve as anticipated.    Collaboration of Care: Other provider involved in patient's care AEB notes are available in epic to review  Patient/Guardian was advised Release of Information must be obtained prior to any record release in order to collaborate their care with an outside provider. Patient/Guardian was advised if they have not already done so to contact the registration department to sign all necessary forms in order for us  to release information regarding their care.   Consent: Patient/Guardian gives verbal consent for treatment and assignment of benefits for services provided during this visit. Patient/Guardian expressed understanding and agreed to proceed.     Total encounter time 22 minutes which includes face-to-face time, chart reviewed, care coordination, order entry and documentation during this encounter.   Note: This document  was prepared by [employer] voice dictation technology and any errors that results from this process are unintentional.  Leni ONEIDA Client, MD 05/25/2024

## 2024-05-26 ENCOUNTER — Other Ambulatory Visit: Payer: Self-pay

## 2024-06-03 ENCOUNTER — Other Ambulatory Visit (HOSPITAL_BASED_OUTPATIENT_CLINIC_OR_DEPARTMENT_OTHER): Payer: Self-pay

## 2024-06-03 MED ORDER — FLUZONE 0.5 ML IM SUSY
0.5000 mL | PREFILLED_SYRINGE | Freq: Once | INTRAMUSCULAR | 0 refills | Status: AC
  Filled 2024-06-03: qty 0.5, 1d supply, fill #0

## 2024-06-05 ENCOUNTER — Other Ambulatory Visit (HOSPITAL_BASED_OUTPATIENT_CLINIC_OR_DEPARTMENT_OTHER): Payer: Self-pay

## 2024-06-15 ENCOUNTER — Other Ambulatory Visit (HOSPITAL_BASED_OUTPATIENT_CLINIC_OR_DEPARTMENT_OTHER): Payer: Self-pay

## 2024-06-18 ENCOUNTER — Other Ambulatory Visit (HOSPITAL_BASED_OUTPATIENT_CLINIC_OR_DEPARTMENT_OTHER): Payer: Self-pay

## 2024-08-10 ENCOUNTER — Telehealth (HOSPITAL_COMMUNITY): Payer: Self-pay | Admitting: Psychiatry

## 2024-08-10 ENCOUNTER — Other Ambulatory Visit: Payer: Self-pay

## 2024-08-10 ENCOUNTER — Telehealth (HOSPITAL_COMMUNITY): Admitting: Psychiatry

## 2024-08-10 ENCOUNTER — Other Ambulatory Visit (HOSPITAL_BASED_OUTPATIENT_CLINIC_OR_DEPARTMENT_OTHER): Payer: Self-pay

## 2024-08-10 ENCOUNTER — Encounter (HOSPITAL_COMMUNITY): Payer: Self-pay | Admitting: Psychiatry

## 2024-08-10 VITALS — Wt 145.0 lb

## 2024-08-10 DIAGNOSIS — F419 Anxiety disorder, unspecified: Secondary | ICD-10-CM

## 2024-08-10 DIAGNOSIS — F331 Major depressive disorder, recurrent, moderate: Secondary | ICD-10-CM

## 2024-08-10 MED ORDER — RISPERIDONE 0.5 MG PO TABS
0.5000 mg | ORAL_TABLET | Freq: Every day | ORAL | 0 refills | Status: AC
  Filled 2024-08-10: qty 90, 90d supply, fill #0

## 2024-08-10 MED ORDER — BUPROPION HCL ER (XL) 300 MG PO TB24
300.0000 mg | ORAL_TABLET | Freq: Every day | ORAL | 0 refills | Status: AC
  Filled 2024-08-10: qty 90, 90d supply, fill #0

## 2024-08-10 MED ORDER — HYDROXYZINE PAMOATE 25 MG PO CAPS
25.0000 mg | ORAL_CAPSULE | Freq: Three times a day (TID) | ORAL | 2 refills | Status: AC | PRN
  Filled 2024-08-10: qty 45, 8d supply, fill #0
  Filled 2024-09-11: qty 45, 8d supply, fill #1

## 2024-08-10 MED ORDER — LAMOTRIGINE 150 MG PO TABS
150.0000 mg | ORAL_TABLET | Freq: Every morning | ORAL | 0 refills | Status: AC
  Filled 2024-08-10: qty 90, 90d supply, fill #0

## 2024-08-10 NOTE — Progress Notes (Signed)
 [facility] MD Virtual Progress Note   Patient Location: Work Provider Location: Home Office  I connect with patient by phone and verified that I am speaking with correct person by using two identifiers. I discussed the limitations of evaluation and management by telemedicine and the availability of in person appointments. I also discussed with the patient that there may be a patient responsible charge related to this service. The patient expressed understanding and agreed to proceed.  Martha Shannon [id number] 43 y.o.  08/10/2024 11:39 AM  History of Present Illness:  Patient is evaluated by phone session.  She is at work and could not do video session.  She is working in radiology.  She reported lately job is very busy but also very happy as daughter is visiting and is staying with her.  Patient enjoys the company of her daughter who is now in college.  She reported husband is also doing very well and trying to keep her busy but doing things that she does not get bored.  She reported to still issues with sleep and despite taking hydroxyzine  up to 30 mg some nights only getting few hours of sleep.  She denies any crying spells, hopelessness, suicidal thoughts.  She reported sometimes racing thoughts and that does not let her sleep.  However she denies any agitation, anger, mania, psychosis.  She is taking Mounjaro  and lost another 2 pounds since the last visit.  She has no tremor or shakes or any EPS.  She started taking iron pills because of anemia.  Energy level is okay.  She denies drinking or using any illegal substances.  Past Psychiatric History: H/O depression anxiety since age 48.  No h/o inpatient treatment, suicidal attempt, psychosis, abuse.  PCP tried Lexapro that stop working after 1 year, Abilify  make mood worst. Buspar , Seroquel , Zoloft , Klonopin , Xanax , gabapentin  and Strattera  did not work.   Past Medical History:  Diagnosis Date   Anxiety and depression  09/24/2016   Asthma     controlled    Chicken pox    As a child.   Depression    Diabetes mellitus type 2 in obese 09/24/2016   Fatty liver    Kidney stones    Migraines    PONV (postoperative nausea and vomiting)    vomiting with epidural during childbirth     Outpatient Encounter Medications as of 08/10/2024  Medication Sig   albuterol  (VENTOLIN  HFA) 108 (90 Base) MCG/ACT inhaler Inhale 2 puffs into the lungs every 4 (four) hours as needed for wheezing or shortness of breath. Needs appt   buPROPion  (WELLBUTRIN  XL) 300 MG 24 hr tablet Take 1 tablet (300 mg total) by mouth daily.   clobetasol  (TEMOVATE ) 0.05 % external solution Apply 1 Application topically 2 (two) times daily.   hydrOXYzine  (ATARAX ) 10 MG tablet Take 1 tablet (10 mg total) by mouth 2 (two) times daily as needed for anxiety.   ketoconazole  (NIZORAL ) 2 % shampoo Apply 1 Application topically 2 (two) times a week.   lamoTRIgine  (LAMICTAL ) 150 MG tablet Take 1 tablet (150 mg total) by mouth every morning.   LORazepam  (ATIVAN ) 0.5 MG tablet Take 1 tablet (0.5 mg total) by mouth daily as needed for panic attacks. (Patient not taking: Reported on 02/24/2024)   risperiDONE  (RISPERDAL ) 0.5 MG tablet Take 1 tablet (0.5 mg total) by mouth at bedtime.   rosuvastatin  (CRESTOR ) 20 MG tablet Take 1 tablet (20 mg total) by mouth daily.   tirzepatide  (MOUNJARO ) 7.5 MG/0.5ML Pen Inject  7.5 mg into the skin once a week.   [DISCONTINUED] phentermine  (ADIPEX-P ) 37.5 MG tablet Take 1 tablet (37.5 mg total) by mouth daily before breakfast.   [DISCONTINUED] QUEtiapine  (SEROQUEL ) 50 MG tablet Take 1 tablet (50 mg total) by mouth at bedtime. (Patient not taking: Reported on 05/09/2020)   [DISCONTINUED] sertraline  (ZOLOFT ) 100 MG tablet TAKE 1 TABLET BY MOUTH ONCE DAILY   [DISCONTINUED] traZODone  (DESYREL ) 50 MG tablet TAKE 1/2-1 TABLET (25-50 MG TOTAL) BY MOUTH AT BEDTIME AS NEEDED FOR SLEEP.   No facility-administered encounter medications on  file as of 08/10/2024.    Recent Results (from the past 2160 hours)  CBC     Status: Abnormal   Collection Time: 05/12/24  4:24 PM  Result Value Ref Range   WBC 7.9 4.0 - 10.5 K/uL   RBC 4.61 3.87 - 5.11 Mil/uL   Platelets 411.0 (H) 150.0 - 400.0 K/uL   Hemoglobin 10.1 (L) 12.0 - 15.0 g/dL   HCT 68.2 (L) 63.9 - 53.9 %   MCV 68.7 Repeated and verified X2. (L) 78.0 - 100.0 fl   MCHC 31.7 30.0 - 36.0 g/dL   RDW 80.9 (H) 88.4 - 84.4 %  Comprehensive metabolic panel with GFR     Status: Abnormal   Collection Time: 05/12/24  4:24 PM  Result Value Ref Range   Sodium 136 135 - 145 mEq/L   Potassium 4.1 3.5 - 5.1 mEq/L   Chloride 101 96 - 112 mEq/L   CO2 28 19 - 32 mEq/L   Glucose, Bld 69 (L) 70 - 99 mg/dL   BUN 8 6 - 23 mg/dL   Creatinine, Ser 9.41 0.40 - 1.20 mg/dL   Total Bilirubin 0.4 0.2 - 1.2 mg/dL   Alkaline Phosphatase 46 39 - 117 U/L   AST 17 0 - 37 U/L   ALT 14 0 - 35 U/L   Total Protein 6.7 6.0 - 8.3 g/dL   Albumin 4.4 3.5 - 5.2 g/dL   GFR 889.68 >39.99 mL/min    Comment: Calculated using the CKD-EPI Creatinine Equation (2021)   Calcium  9.3 8.4 - 10.5 mg/dL  Lipid panel     Status: Abnormal   Collection Time: 05/12/24  4:24 PM  Result Value Ref Range   Cholesterol 186 0 - 200 mg/dL    Comment: ATP III Classification       Desirable:  < 200 mg/dL               Borderline High:  200 - 239 mg/dL          High:  > = 759 mg/dL   Triglycerides 61.9 0.0 - 149.0 mg/dL    Comment: Normal:  <849 mg/dLBorderline High:  150 - 199 mg/dL   HDL 33.09 >60.99 mg/dL   VLDL 7.6 0.0 - 59.9 mg/dL   LDL Cholesterol 888 (H) 0 - 99 mg/dL   Total CHOL/HDL Ratio 3     Comment:                Men          Women1/2 Average Risk     3.4          3.3Average Risk          5.0          4.42X Average Risk          9.6          7.13X Average Risk  15.0          11.0                       NonHDL 118.73     Comment: NOTE:  Non-HDL goal should be 30 mg/dL higher than patient's LDL goal (i.e. LDL  goal of < 70 mg/dL, would have non-HDL goal of < 100 mg/dL)  Hemoglobin J8r     Status: None   Collection Time: 05/12/24  4:24 PM  Result Value Ref Range   Hgb A1c MFr Bld 5.6 4.6 - 6.5 %    Comment: Glycemic Control Guidelines for People with Diabetes:Non Diabetic:  <6%Goal of Therapy: <7%Additional Action Suggested:  >8%   Microalbumin / creatinine urine ratio     Status: None   Collection Time: 05/12/24  4:24 PM  Result Value Ref Range   Microalb, Ur 1.2 0.0 - 1.9 mg/dL   Creatinine,U 864.2 mg/dL   Microalb Creat Ratio 8.8 0.0 - 30.0 mg/g  Hepatitis B surface antibody,quantitative     Status: Abnormal   Collection Time: 05/12/24  4:24 PM  Result Value Ref Range   Hep B S AB Quant (Post) 9 (L) > OR = 10 mIU/mL    Comment: . Patient does not have immunity to hepatitis B virus. . For additional information, please refer to http://education.quest[facility].com/faq/FAQ105 (This link is being provided for informational/ educational purposes only).      Psychiatric Specialty Exam: Physical Exam  Review of Systems  Weight 145 lb (65.8 kg).There is no height or weight on file to calculate BMI.  General Appearance: NA  Eye Contact:  NA  Speech:  Clear and Coherent and Normal Rate  Volume:  Normal  Mood:  Euthymic  Affect:  NA  Thought Process:  Goal Directed  Orientation:  Full (Time, Place, and Person)  Thought Content:  WDL  Suicidal Thoughts:  No  Homicidal Thoughts:  No  Memory:  Immediate;   Good Recent;   Good Remote;   Good  Judgement:  Good  Insight:  Good  Psychomotor Activity:  NA  Concentration:  Concentration: Good and Attention Span: Good  Recall:  Good  Fund of Knowledge:  Good  Language:  Good  Akathisia:  No  Handed:  Right  AIMS (if indicated):     Assets:  Communication Skills Desire for Improvement Housing Social Support Talents/Skills Transportation  ADL's:  Intact  Cognition:  WNL  Sleep:  ok       05/12/2024    3:57 PM 05/12/2024     3:56 PM 03/01/2023    3:36 PM 10/15/2022    9:43 AM 03/15/2022    3:50 PM  Depression screen PHQ 2/9  Decreased Interest 3 0 0 0 0  Down, Depressed, Hopeless 1 0 0 0 0  PHQ - 2 Score 4 0 0 0 0  Altered sleeping 3 0 0 2   Tired, decreased energy 3 0 0 0   Change in appetite 1 0 0 0   Feeling bad or failure about yourself  0 0 0 0   Trouble concentrating 1 0 0 0   Moving slowly or fidgety/restless 0 0 0 0   Suicidal thoughts 0 0 0 0   PHQ-9 Score 12  0  0  2    Difficult doing work/chores Not difficult at all Not difficult at all Not difficult at all Not difficult at all      Data saved with a previous  flowsheet row definition    Assessment/Plan: MDD (major depressive disorder), recurrent episode, moderate (HCC) - Plan: buPROPion  (WELLBUTRIN  XL) 300 MG 24 hr tablet, lamoTRIgine  (LAMICTAL ) 150 MG tablet, hydrOXYzine  (VISTARIL ) 25 MG capsule  Anxiety - Plan: buPROPion  (WELLBUTRIN  XL) 300 MG 24 hr tablet, lamoTRIgine  (LAMICTAL ) 150 MG tablet, hydrOXYzine  (VISTARIL ) 25 MG capsule  Patient is 43 year old married employed female with history of major depressive disorder and anxiety.  Reviewed current medication.  She is taking hydroxyzine  10 mg up to 3 pills some nights but is still struggle with sleep.  Recommend to change the hydroxyzine  10 mg tablet to 25 mg capsule and she can take 1 to 2 capsule as needed for insomnia.  Patient agreed to give a try.  She does not want to change other medication because she feel her depression and anxiety is stable.  Continue Lamictal  150 mg daily, risperidone  0.5 mg at bedtime and Wellbutrin  XL 300 mg in the morning.  Recommend to call back if she has any question or any concern.  She started taking iron pills.  Will follow-up in 3 months unless patient require a sooner appointment.  Follow Up Instructions:     I discussed the assessment and treatment plan with the patient. The patient was provided an opportunity to ask questions and all were answered. The  patient agreed with the plan and demonstrated an understanding of the instructions.   The patient was advised to call back or seek an in-person evaluation if the symptoms worsen or if the condition fails to improve as anticipated.    Collaboration of Care: Other provider involved in patient's care AEB notes are available in epic to review  Patient/Guardian was advised Release of Information must be obtained prior to any record release in order to collaborate their care with an outside provider. Patient/Guardian was advised if they have not already done so to contact the registration department to sign all necessary forms in order for us  to release information regarding their care.   Consent: Patient/Guardian gives verbal consent for treatment and assignment of benefits for services provided during this visit. Patient/Guardian expressed understanding and agreed to proceed.     Total encounter time 21 minutes which includes face-to-face time, chart reviewed, care coordination, order entry and documentation during this encounter.   Note: This document was prepared by [employer] voice dictation technology and any errors that results from this process are unintentional.    Leni ONEIDA Client, MD 08/10/2024

## 2024-09-08 ENCOUNTER — Other Ambulatory Visit (HOSPITAL_BASED_OUTPATIENT_CLINIC_OR_DEPARTMENT_OTHER): Payer: Self-pay

## 2024-09-10 ENCOUNTER — Other Ambulatory Visit (HOSPITAL_BASED_OUTPATIENT_CLINIC_OR_DEPARTMENT_OTHER): Payer: Self-pay

## 2024-09-11 ENCOUNTER — Other Ambulatory Visit (HOSPITAL_COMMUNITY): Payer: Self-pay

## 2024-09-11 ENCOUNTER — Other Ambulatory Visit: Payer: Self-pay

## 2024-11-12 ENCOUNTER — Telehealth (HOSPITAL_COMMUNITY): Admitting: Psychiatry

## 2024-11-27 ENCOUNTER — Ambulatory Visit
